# Patient Record
Sex: Female | Born: 1985 | Race: Black or African American | Hispanic: No | Smoking: Never smoker
Health system: Southern US, Community
[De-identification: ages and names within clinical notes are randomized; demographics above are authoritative.]

## PROBLEM LIST (undated history)

## (undated) ENCOUNTER — Inpatient Hospital Stay (HOSPITAL_COMMUNITY): Payer: Self-pay

## (undated) DIAGNOSIS — I10 Essential (primary) hypertension: Secondary | ICD-10-CM

## (undated) DIAGNOSIS — O139 Gestational [pregnancy-induced] hypertension without significant proteinuria, unspecified trimester: Secondary | ICD-10-CM

## (undated) DIAGNOSIS — E119 Type 2 diabetes mellitus without complications: Secondary | ICD-10-CM

## (undated) HISTORY — PX: NO PAST SURGERIES: SHX2092

---

## 2016-09-25 ENCOUNTER — Emergency Department (HOSPITAL_COMMUNITY)
Admission: EM | Admit: 2016-09-25 | Discharge: 2016-09-25 | Disposition: A | Discharge status: Home / Self Care | Payer: Medicaid Other | Attending: Dermatology | Admitting: Dermatology

## 2016-09-25 ENCOUNTER — Encounter (HOSPITAL_COMMUNITY): Payer: Self-pay | Admitting: *Deleted

## 2016-09-25 DIAGNOSIS — I10 Essential (primary) hypertension: Secondary | ICD-10-CM | POA: Diagnosis not present

## 2016-09-25 DIAGNOSIS — N898 Other specified noninflammatory disorders of vagina: Secondary | ICD-10-CM | POA: Diagnosis present

## 2016-09-25 DIAGNOSIS — E119 Type 2 diabetes mellitus without complications: Secondary | ICD-10-CM | POA: Diagnosis not present

## 2016-09-25 DIAGNOSIS — Z79899 Other long term (current) drug therapy: Secondary | ICD-10-CM | POA: Diagnosis not present

## 2016-09-25 DIAGNOSIS — Z5321 Procedure and treatment not carried out due to patient leaving prior to being seen by health care provider: Secondary | ICD-10-CM | POA: Diagnosis not present

## 2016-09-25 HISTORY — DX: Essential (primary) hypertension: I10

## 2016-09-25 HISTORY — DX: Type 2 diabetes mellitus without complications: E11.9

## 2016-09-25 NOTE — ED Triage Notes (Signed)
Pt here for vaginal itching onset yesterday, hx of the same, pt reports recently taking Antibiotics and did not request pill for yeast infection, like she normally does, denies vaginal discharge, A&O x4

## 2017-02-18 ENCOUNTER — Encounter (HOSPITAL_COMMUNITY): Payer: Self-pay

## 2017-02-18 ENCOUNTER — Emergency Department (HOSPITAL_COMMUNITY)
Admission: EM | Admit: 2017-02-18 | Discharge: 2017-02-18 | Disposition: A | Discharge status: Home / Self Care | Payer: Medicaid Other | Attending: Emergency Medicine | Admitting: Emergency Medicine

## 2017-02-18 ENCOUNTER — Encounter (HOSPITAL_COMMUNITY): Payer: Self-pay | Admitting: Emergency Medicine

## 2017-02-18 ENCOUNTER — Ambulatory Visit (HOSPITAL_COMMUNITY)
Admission: EM | Admit: 2017-02-18 | Discharge: 2017-02-18 | Disposition: A | Discharge status: Home / Self Care | Payer: Medicaid Other | Attending: Family Medicine | Admitting: Family Medicine

## 2017-02-18 DIAGNOSIS — M62830 Muscle spasm of back: Secondary | ICD-10-CM | POA: Diagnosis not present

## 2017-02-18 DIAGNOSIS — I10 Essential (primary) hypertension: Secondary | ICD-10-CM | POA: Diagnosis not present

## 2017-02-18 DIAGNOSIS — M545 Low back pain: Secondary | ICD-10-CM | POA: Insufficient documentation

## 2017-02-18 DIAGNOSIS — E119 Type 2 diabetes mellitus without complications: Secondary | ICD-10-CM | POA: Diagnosis not present

## 2017-02-18 DIAGNOSIS — N898 Other specified noninflammatory disorders of vagina: Secondary | ICD-10-CM | POA: Insufficient documentation

## 2017-02-18 DIAGNOSIS — Z5321 Procedure and treatment not carried out due to patient leaving prior to being seen by health care provider: Secondary | ICD-10-CM | POA: Diagnosis not present

## 2017-02-18 DIAGNOSIS — N760 Acute vaginitis: Secondary | ICD-10-CM | POA: Diagnosis not present

## 2017-02-18 LAB — POCT PREGNANCY, URINE: Preg Test, Ur: NEGATIVE

## 2017-02-18 LAB — POCT URINALYSIS DIP (DEVICE)
Bilirubin Urine: NEGATIVE
Glucose, UA: 100 mg/dL — AB
Ketones, ur: 15 mg/dL — AB
Leukocytes, UA: NEGATIVE
Nitrite: NEGATIVE
PH: 6 (ref 5.0–8.0)
PROTEIN: NEGATIVE mg/dL
SPECIFIC GRAVITY, URINE: 1.025 (ref 1.005–1.030)
UROBILINOGEN UA: 0.2 mg/dL (ref 0.0–1.0)

## 2017-02-18 LAB — URINALYSIS, ROUTINE W REFLEX MICROSCOPIC
BILIRUBIN URINE: NEGATIVE
Glucose, UA: >=500 mg/dL — AB
Hgb urine dipstick: NEGATIVE
Ketones, ur: 20 mg/dL — AB
Leukocytes, UA: NEGATIVE
Nitrite: NEGATIVE
PH: 6 (ref 5.0–8.0)
Protein, ur: NEGATIVE mg/dL
SQUAMOUS EPITHELIAL / LPF: NONE SEEN
Specific Gravity, Urine: 1.027 (ref 1.005–1.030)

## 2017-02-18 LAB — POC URINE PREG, ED: PREG TEST UR: NEGATIVE

## 2017-02-18 MED ORDER — METHOCARBAMOL 500 MG PO TABS: 500.0000 mg | ORAL_TABLET | Freq: Four times a day (QID) | ORAL | 0 refills | Status: DC | PRN

## 2017-02-18 MED ORDER — FLUCONAZOLE 200 MG PO TABS: ORAL_TABLET | ORAL | 0 refills | Status: DC

## 2017-02-18 NOTE — ED Triage Notes (Signed)
Patient complains of vaginal itching x 3 days. Has just recently completed antibiotics for BV

## 2017-02-18 NOTE — Discharge Instructions (Signed)
Rest, heating pad as needed, Robaxin for pain. This may cause drowsiness, do not drive until you know how this medicine affects you. It is safe to take tylenol or ibuprofen as needed with this.   Your urine is being tested for various types of infections, if positive we will call you. In the meantime, I prescribed Diflucan, take 1 tablet now, wait 3 days, then take the second tablet.

## 2017-02-18 NOTE — ED Triage Notes (Addendum)
Pt has been having lower back pain that has moved to her left flank and vaginal itching for 4 days. She was on Flagyl for BV last week.  She tried Monistat with no relief.  She has a hx of yeast infections with abx.

## 2017-02-18 NOTE — ED Provider Notes (Signed)
[facility]   [id number] 02/18/17 Arrival Time: 1443   SUBJECTIVE:  Cheryl Curry is a 31 y.o. female who presents to the urgent care with complaint of lower back pain. Worse with movement and bending. Denies any fever or chills, also has a complaint of vaginal itching. States she believes she has a yeast infection, was recently treated for bacterial vaginosis with metronidazole, and another antibiotic for another infection as well, but is unable to specify what that was. She is not concerned about STDs, stating she is sexually active, with 1 partner, she is married. Denies any other significant history     Past Medical History:  Diagnosis Date  . Diabetes mellitus without complication (HCC)   . Hypertension    History reviewed. No pertinent family history. Social History   Social History  . Marital status: Married    Spouse name: N/A  . Number of children: N/A  . Years of education: N/A   Occupational History  . Not on file.   Social History Main Topics  . Smoking status: Never Smoker  . Smokeless tobacco: Never Used  . Alcohol use Yes  . Drug use: No  . Sexual activity: Not on file   Other Topics Concern  . Not on file   Social History Narrative  . No narrative on file   Current Meds  Medication Sig  . hydrochlorothiazide (HYDRODIURIL) 25 MG tablet Take 25 mg by mouth daily.   No Known Allergies    ROS: As per HPI, remainder of ROS negative.   OBJECTIVE:   Vitals:   02/18/17 1533  BP: (!) 149/106  Pulse: 81  Temp: 98.4 F (36.9 C)  TempSrc: Oral  SpO2: 98%     General appearance: alert; no distress Eyes: PERRL; EOMI; conjunctiva normal HENT: normocephalic; atraumatic; TMs normal, canal normal, external ears normal without trauma; nasal mucosa normal; oral mucosa normal Neck: supple Lungs: clear to auscultation bilaterally Heart: regular rate and rhythm Abdomen: soft, non-tender; GU: Deferred, urine cytology  obtained Back: no CVA tenderness, Palpable muscle spasm felt in the lower left lumbar region Extremities: no cyanosis or edema; symmetrical with no gross deformities Skin: warm and dry Neurologic: normal gait; grossly normal Psychological: alert and cooperative; normal mood and affect      Labs:  Results for orders placed or performed during the hospital encounter of 02/18/17  POCT urinalysis dip (device)  Result Value Ref Range   Glucose, UA 100 (A) NEGATIVE mg/dL   Bilirubin Urine NEGATIVE NEGATIVE   Ketones, ur 15 (A) NEGATIVE mg/dL   Specific Gravity, Urine 1.025 1.005 - 1.030   Hgb urine dipstick TRACE (A) NEGATIVE   pH 6.0 5.0 - 8.0   Protein, ur NEGATIVE NEGATIVE mg/dL   Urobilinogen, UA 0.2 0.0 - 1.0 mg/dL   Nitrite NEGATIVE NEGATIVE   Leukocytes, UA NEGATIVE NEGATIVE  Pregnancy, urine POC  Result Value Ref Range   Preg Test, Ur NEGATIVE NEGATIVE    Labs Reviewed  POCT URINALYSIS DIP (DEVICE) - Abnormal; Notable for the following:       Result Value   Glucose, UA 100 (*)    Ketones, ur 15 (*)    Hgb urine dipstick TRACE (*)    All other components within normal limits  POCT PREGNANCY, URINE  URINE CYTOLOGY ANCILLARY ONLY    No results found.     ASSESSMENT & PLAN:  1. Vaginitis and vulvovaginitis   2. Muscle spasm of back     Meds ordered this  encounter  Medications  . hydrochlorothiazide (HYDRODIURIL) 25 MG tablet    Sig: Take 25 mg by mouth daily.  . fluconazole (DIFLUCAN) 200 MG tablet    Sig: Take one tablet today, wait 3 days, take the second tablet    Dispense:  2 tablet    Refill:  0  . methocarbamol (ROBAXIN) 500 MG tablet    Sig: Take 1 tablet (500 mg total) by mouth every 6 (six) hours as needed for muscle spasms.    Dispense:  40 tablet    Refill:  0    We'll treat vaginitis with Diflucan, obtain urine cytology for confirmation. Treat muscle spasm with Robaxin. Follow-up with her regular doctor, or return to clinic as  needed. Reviewed expectations re: course of current medical issues. Questions answered. Outlined signs and symptoms indicating need for more acute intervention. Patient verbalized understanding. After Visit Summary given.    Procedures:        Dorena Bodo, NP 02/18/17 1904

## 2017-02-18 NOTE — ED Notes (Signed)
RN informed by tech pt sts unable to stay any longer and had to leave. Pt was encouraged to return when able.

## 2017-02-19 LAB — URINE CYTOLOGY ANCILLARY ONLY
Chlamydia: NEGATIVE
Neisseria Gonorrhea: NEGATIVE
Trichomonas: NEGATIVE

## 2017-02-22 LAB — URINE CYTOLOGY ANCILLARY ONLY
BACTERIAL VAGINITIS: NEGATIVE
Candida vaginitis: NEGATIVE

## 2017-04-01 ENCOUNTER — Emergency Department (HOSPITAL_COMMUNITY): Payer: Medicaid Other

## 2017-04-01 ENCOUNTER — Emergency Department (HOSPITAL_COMMUNITY)
Admission: EM | Admit: 2017-04-01 | Discharge: 2017-04-01 | Disposition: A | Discharge status: Home / Self Care | Payer: Medicaid Other | Attending: Emergency Medicine | Admitting: Emergency Medicine

## 2017-04-01 ENCOUNTER — Encounter (HOSPITAL_COMMUNITY): Payer: Self-pay

## 2017-04-01 DIAGNOSIS — E119 Type 2 diabetes mellitus without complications: Secondary | ICD-10-CM | POA: Insufficient documentation

## 2017-04-01 DIAGNOSIS — R200 Anesthesia of skin: Secondary | ICD-10-CM | POA: Insufficient documentation

## 2017-04-01 DIAGNOSIS — I10 Essential (primary) hypertension: Secondary | ICD-10-CM | POA: Insufficient documentation

## 2017-04-01 DIAGNOSIS — R202 Paresthesia of skin: Secondary | ICD-10-CM

## 2017-04-01 LAB — CBC
HCT: 37.2 % (ref 36.0–46.0)
Hemoglobin: 12.9 g/dL (ref 12.0–15.0)
MCH: 31.2 pg (ref 26.0–34.0)
MCHC: 34.7 g/dL (ref 30.0–36.0)
MCV: 90.1 fL (ref 78.0–100.0)
Platelets: 284 10*3/uL (ref 150–400)
RBC: 4.13 MIL/uL (ref 3.87–5.11)
RDW: 12.3 % (ref 11.5–15.5)
WBC: 6.2 10*3/uL (ref 4.0–10.5)

## 2017-04-01 LAB — BASIC METABOLIC PANEL
ANION GAP: 11 (ref 5–15)
BUN: 9 mg/dL (ref 6–20)
CALCIUM: 8.7 mg/dL — AB (ref 8.9–10.3)
CO2: 22 mmol/L (ref 22–32)
Chloride: 101 mmol/L (ref 101–111)
Creatinine, Ser: 0.9 mg/dL (ref 0.44–1.00)
GFR calc Af Amer: >60 mL/min (ref >60)
GLUCOSE: 341 mg/dL — AB (ref 65–99)
Potassium: 3.7 mmol/L (ref 3.5–5.1)
SODIUM: 134 mmol/L — AB (ref 135–145)

## 2017-04-01 MED ORDER — LISINOPRIL 20 MG PO TABS
20.0000 mg | ORAL_TABLET | Freq: Once | ORAL | Status: AC
  Administered 2017-04-01: 20 mg via ORAL
  Filled 2017-04-01: qty 1

## 2017-04-01 NOTE — ED Provider Notes (Signed)
[facility]   CSN: [id number] Arrival date & time: 04/01/17  1214     History   Chief Complaint No chief complaint on file.   HPI Meribeth MattesDanielle Pinchback is a 31 y.o. female.  HPI  Patient with history of diabetes and hypertension with concern of bilateral upper extremity paresthesia as well as possibly 3 days ago, and symptoms been primarily in the right arm, though she had one episode of left proximal arm paresthesia earlier today. She notes that since onset she has had persistent discomfort in the right arm, described as numbness, difficulty with sensation. No complete weakness, no loss of sensation, no ataxia. No concurrent other complaints including no chest pain, no dyspnea, no lightheadedness, no syncope. Patient also ran out of her medication, has not been taking her antihypertensive.   Past Medical History:  Diagnosis Date  . Diabetes mellitus without complication (HCC)   . Hypertension     There are no active problems to display for this patient.   History reviewed. No pertinent surgical history.  OB History    No data available       Home Medications    Prior to Admission medications   Not on File    Family History No family history on file.  Social History Social History  Substance Use Topics  . Smoking status: Never Smoker  . Smokeless tobacco: Never Used  . Alcohol use Yes     Comment: occassional     Allergies   Patient has no known allergies.   Review of Systems Review of Systems  Constitutional:       Per HPI, otherwise negative  HENT:       Per HPI, otherwise negative  Respiratory:       Per HPI, otherwise negative  Cardiovascular:       Per HPI, otherwise negative  Gastrointestinal: Negative for vomiting.  Endocrine:       Negative aside from HPI  Genitourinary:       Neg aside from HPI   Musculoskeletal:       Per HPI, otherwise negative  Skin: Negative.     Neurological: Positive for numbness. Negative for syncope, weakness and headaches.     Physical Exam Updated Vital Signs BP (!) 167/122   Pulse 76   Temp 97.9 F (36.6 C) (Oral)   Resp 20   LMP 03/25/2017   SpO2 100%   Physical Exam  Constitutional: She is oriented to person, place, and time. She appears well-developed and well-nourished. No distress.  HENT:  Head: Normocephalic and atraumatic.  Eyes: Conjunctivae and EOM are normal.  Neck: No tracheal tenderness, no spinous process tenderness and no muscular tenderness present. No neck rigidity. No tracheal deviation, no edema, no erythema and normal range of motion present.  Cardiovascular: Normal rate and regular rhythm.   Pulmonary/Chest: Effort normal and breath sounds normal. No stridor. No respiratory distress.  Abdominal: She exhibits no distension.  Musculoskeletal: She exhibits no edema.  Neurological: She is alert and oriented to person, place, and time. She displays no atrophy and no tremor. No cranial nerve deficit or sensory deficit. She exhibits normal muscle tone. She displays no seizure activity. Coordination normal.  Skin: Skin is warm and dry.  Psychiatric: She has a normal mood and affect.  Nursing note and vitals reviewed.    ED Treatments / Results  Labs (all labs ordered are listed, but only abnormal results are displayed) Labs Reviewed  BASIC  METABOLIC PANEL - Abnormal; Notable for the following:       Result Value   Sodium 134 (*)    Glucose, Bld 341 (*)    Calcium 8.7 (*)    All other components within normal limits  CBC    Radiology Dg Cervical Spine Complete  Result Date: 04/01/2017 CLINICAL DATA:  Paresthesia in the upper extremity EXAM: CERVICAL SPINE - COMPLETE 4+ VIEW COMPARISON:  None. FINDINGS: Mild straightening of the cervical spine. Prevertebral soft tissue thickness is normal. Vertebral body heights are normal. Dens and lateral masses are unremarkable. Mild spurring or hair  artifact over right foramen at C3-C4. IMPRESSION: Straightening of the cervical spine.  No acute osseous abnormality. Electronically Signed   By: Jasmine Pang M.D.   On: 04/01/2017 17:17    Procedures Procedures (including critical care time)  Medications Ordered in ED   Initial Impression / Assessment and Plan / ED Course  I have reviewed the triage vital signs and the nursing notes.  Pertinent labs & imaging results that were available during my care of the patient were reviewed by me and considered in my medical decision making (see chart for details).  6:45 PM Patient presents with concern of paresthesias in both arms, though has no objective neurologic deficits. No evidence for stroke, some suspicion for persistent hypertension given the patient's medication noncompliance. With reassuring labs, no evidence for stroke, and after x-rays performed to exclude cervical lesion, the patient received her home medication, was discharged with instructions to resume her home medication which she will do in the morning, and follow-up with primary care.   Final Clinical Impressions(s) / ED Diagnoses  Numbness    Gerhard Munch, MD 04/01/17 [phone]

## 2017-04-01 NOTE — ED Notes (Signed)
Patient taken to XRAY

## 2017-04-01 NOTE — ED Notes (Signed)
Patient Alert and oriented X4. Stable and ambulatory. Patient verbalized understanding of the discharge instructions.  Patient belongings were taken by the patient.  

## 2017-04-01 NOTE — Discharge Instructions (Signed)
As discussed, your evaluation today has been largely reassuring.  But, it is important that you monitor your condition carefully, and do not hesitate to return to the ED if you develop new, or concerning changes in your condition. ? ?Otherwise, please follow-up with your physician for appropriate ongoing care. ? ?

## 2017-04-01 NOTE — ED Triage Notes (Signed)
Patient complains of 2 days of right arm tingling/numbness with intermittent swelling, no neuro deficits, denies trauma. NAD

## 2017-04-02 ENCOUNTER — Encounter (HOSPITAL_COMMUNITY): Payer: Self-pay

## 2017-06-08 NOTE — L&D Delivery Note (Addendum)
32 y.o. [id number] at 4333w2d admitted for IOL for cHTN, SIPE with SF and T2DM augmented by cytotec, foley bulb, and pitocin,  delivered a viable female infant at 0809 in cephalic, LOA position. No nuchal cord. Shoulder dystocia for 50 sec with McRobert's, Suprapubic pressure, and Wood screw attempted before delivering L anterior shoulder. 20 sec delayed cord clamping. Cord clamped x2 and cut due to poor respiratory effort and tone. Placenta delivered spontaneously with trailing membranes extracted with ring forceps and 3VC. Fundus firm on exam with massage, tranexamic acid and pitocin. Good hemostasis noted after right sulcus laceration repaired and meds administered.  Anesthesia: Epidural Laceration: Right sulcus laceration Suture: 3.0 vicryl Good hemostasis noted. EBL: 470 cc  Mom recovering in LDR, baby sent to NICU for poor respiratory effort.    Apgars: APGAR (1 MIN): 1 APGAR (5 MINS): 5 Weight: 4070g Sponge and instrument count were correct x2. Placenta sent to L&D. Girl, breast and bottle feeding, vasectomy (done) for contraception.  [place], DO FM Resident PGY-2 04/29/2018 8:44 AM   OB FELLOW DELIVERY ATTESTATION  I was gloved and present for the delivery in its entirety, and I agree with the above resident's note.    Marcy Sirenatherine Wallace, D.O. OB Fellow  04/30/2018, 10:21 AM

## 2017-07-18 ENCOUNTER — Other Ambulatory Visit: Payer: Self-pay | Admitting: Primary Care

## 2017-07-18 DIAGNOSIS — N63 Unspecified lump in unspecified breast: Secondary | ICD-10-CM

## 2017-07-23 ENCOUNTER — Ambulatory Visit: Payer: Medicaid Other

## 2017-07-23 ENCOUNTER — Ambulatory Visit
Admission: RE | Admit: 2017-07-23 | Discharge: 2017-07-23 | Disposition: A | Discharge status: Home / Self Care | Payer: Medicaid Other | Source: Ambulatory Visit | Attending: Primary Care | Admitting: Primary Care

## 2017-07-23 DIAGNOSIS — N63 Unspecified lump in unspecified breast: Secondary | ICD-10-CM

## 2017-08-02 ENCOUNTER — Ambulatory Visit
Admission: RE | Admit: 2017-08-02 | Discharge: 2017-08-02 | Disposition: A | Discharge status: Home / Self Care | Payer: Medicaid Other | Source: Ambulatory Visit | Attending: Primary Care | Admitting: Primary Care

## 2017-08-02 ENCOUNTER — Ambulatory Visit: Payer: Medicaid Other

## 2017-08-29 ENCOUNTER — Encounter (HOSPITAL_COMMUNITY): Payer: Self-pay | Admitting: Emergency Medicine

## 2017-08-29 ENCOUNTER — Ambulatory Visit (HOSPITAL_COMMUNITY)
Admission: EM | Admit: 2017-08-29 | Discharge: 2017-08-29 | Disposition: A | Discharge status: Home / Self Care | Payer: Medicaid Other | Attending: Internal Medicine | Admitting: Internal Medicine

## 2017-08-29 DIAGNOSIS — R0981 Nasal congestion: Secondary | ICD-10-CM

## 2017-08-29 DIAGNOSIS — Z79899 Other long term (current) drug therapy: Secondary | ICD-10-CM | POA: Insufficient documentation

## 2017-08-29 DIAGNOSIS — E119 Type 2 diabetes mellitus without complications: Secondary | ICD-10-CM | POA: Insufficient documentation

## 2017-08-29 DIAGNOSIS — I10 Essential (primary) hypertension: Secondary | ICD-10-CM | POA: Diagnosis not present

## 2017-08-29 DIAGNOSIS — R21 Rash and other nonspecific skin eruption: Secondary | ICD-10-CM

## 2017-08-29 DIAGNOSIS — N898 Other specified noninflammatory disorders of vagina: Secondary | ICD-10-CM | POA: Diagnosis present

## 2017-08-29 DIAGNOSIS — Z7984 Long term (current) use of oral hypoglycemic drugs: Secondary | ICD-10-CM | POA: Diagnosis not present

## 2017-08-29 LAB — POCT PREGNANCY, URINE: Preg Test, Ur: NEGATIVE

## 2017-08-29 MED ORDER — FLUCONAZOLE 150 MG PO TABS: 150.0000 mg | ORAL_TABLET | Freq: Once | ORAL | 0 refills | Status: AC

## 2017-08-29 MED ORDER — CETIRIZINE HCL 10 MG PO CAPS: 10.0000 mg | ORAL_CAPSULE | Freq: Every day | ORAL | 0 refills | Status: DC

## 2017-08-29 MED ORDER — TRIAMCINOLONE ACETONIDE 0.1 % EX CREA
1.0000 | TOPICAL_CREAM | Freq: Two times a day (BID) | CUTANEOUS | 0 refills | Status: DC
Start: 2017-08-29 — End: 2017-10-28

## 2017-08-29 MED ORDER — FLUTICASONE PROPIONATE 50 MCG/ACT NA SUSP
1.0000 | Freq: Every day | NASAL | 0 refills | Status: DC
Start: 2017-08-29 — End: 2017-10-28

## 2017-08-29 NOTE — Discharge Instructions (Addendum)
For your yeast infection please take 1 Diflucan today.  May repeat in 3 days if symptoms not improving.  I have provided a couple extra tablets that you frequently get yeast infections.  Please begin Zyrtec and Flonase for your nasal congestion.  Please compare price to over-the-counter and get whichever is cheaper for you.  Please try Kenalog cream on rash, please apply twice daily for the next week.  Please return if symptoms not improving and still bothering you.

## 2017-08-29 NOTE — ED Provider Notes (Signed)
[facility]    CSN: [id number] Arrival date & time: 08/29/17  1229     History   Chief Complaint Chief Complaint  Patient presents with  . Vaginal Itching  . Nasal Congestion  . Rash    HPI Cheryl Curry is a 32 y.o. female presenting today for vaginal itching and nasal congestion.  She has had vaginal itching for approximately 3 days.  Denies any change in her discharge.  Last menstrual period was 2/20, patient is not on any form of birth control.  Patient states she frequently has yeast infections as she is diabetic.  Denies any nausea, vomiting, abdominal pain, pelvic pain.  She also has had some nasal congestion, not associated with sore throat or coughing.  She believes it is related to her allergies.  She is not taking anything.  Denies any chest pain or shortness of breath.  Denies fevers.  Patient also has a rash on her left ankle, that is associated with occasional itching.  States that it comes and goes.  Denies any rash anywhere else on her body.  She cannot say how long it has been there as she is unsure.  HPI  Past Medical History:  Diagnosis Date  . Diabetes mellitus without complication (HCC)   . Hypertension     There are no active problems to display for this patient.   History reviewed. No pertinent surgical history.  OB History   None      Home Medications    Prior to Admission medications   Medication Sig Start Date End Date Taking? Authorizing Provider  hydrochlorothiazide (HYDRODIURIL) 25 MG tablet Take 25 mg by mouth daily.   Yes [provider]  lisinopril (PRINIVIL,ZESTRIL) 20 MG tablet Take 20 mg by mouth daily.   Yes [provider]  acetaminophen (TYLENOL) 500 MG tablet Take 1,000 mg by mouth every 6 (six) hours as needed for mild pain.    [provider]  Cetirizine HCl 10 MG CAPS Take 1 capsule (10 mg total) by mouth daily. 08/29/17 09/28/17  Wieters, Hallie C, PA-C  fluconazole  (DIFLUCAN) 150 MG tablet Take 1 tablet (150 mg total) by mouth once for 1 dose. 08/29/17 08/29/17  Wieters, Hallie C, PA-C  fluticasone (FLONASE) 50 MCG/ACT nasal spray Place 1-2 sprays into both nostrils daily. 08/29/17   Wieters, Hallie C, PA-C  metFORMIN (GLUCOPHAGE) 1000 MG tablet Take 1,000 mg by mouth 2 (two) times daily with a meal.    [provider]  methocarbamol (ROBAXIN) 500 MG tablet Take 1 tablet (500 mg total) by mouth every 6 (six) hours as needed for muscle spasms. Patient not taking: Reported on 08/29/2017 02/18/17   Dorena BodoKennard, Lawrence, NP  triamcinolone cream (KENALOG) 0.1 % Apply 1 application topically 2 (two) times daily. 08/29/17   Wieters, Junius CreamerHallie C, PA-C    Family History No family history on file.  Social History Social History   Tobacco Use  . Smoking status: Never Smoker  . Smokeless tobacco: Never Used  Substance Use Topics  . Alcohol use: Yes    Comment: occassional  . Drug use: No     Allergies   Patient has no known allergies.   Review of Systems Review of Systems  Constitutional: Negative for fever.  HENT: Positive for congestion and rhinorrhea. Negative for ear pain and sore throat.   Respiratory: Negative for cough and shortness of breath.   Cardiovascular: Negative for chest pain.  Gastrointestinal: Negative for abdominal pain, diarrhea, nausea and  vomiting.  Genitourinary: Negative for dysuria, flank pain, frequency, genital sores, hematuria, menstrual problem, vaginal bleeding, vaginal discharge and vaginal pain.  Musculoskeletal: Negative for back pain.  Skin: Positive for rash.  Neurological: Negative for dizziness, light-headedness and headaches.     Physical Exam Triage Vital Signs ED Triage Vitals [08/29/17 1310]  Enc Vitals Group     BP (!) 168/111     Pulse Rate (!) 102     Resp 18     Temp 98.2 F (36.8 C)     Temp src      SpO2 100 %     Weight      Height      Head Circumference      Peak Flow      Pain Score 0       Pain Loc      Pain Edu?      Excl. in GC?    No data found.  Updated Vital Signs BP (!) 168/111   Pulse (!) 102   Temp 98.2 F (36.8 C)   Resp 18   LMP 08/01/2017   SpO2 100%   Visual Acuity Right Eye Distance:   Left Eye Distance:   Bilateral Distance:    Right Eye Near:   Left Eye Near:    Bilateral Near:     Physical Exam  Constitutional: She appears well-developed and well-nourished. No distress.  HENT:  Head: Normocephalic and atraumatic.  Bilateral TMs nonerythematous, nasal mucosa mildly erythematous with clear rhinorrhea present.  Posterior oropharynx nonerythematous, no tonsillar enlargement or exudate.  Eyes: Conjunctivae are normal.  Neck: Neck supple.  Cardiovascular: Normal rate and regular rhythm.  No murmur heard. Pulmonary/Chest: Effort normal and breath sounds normal. No respiratory distress.  Breathing comfortably at rest, CTA BL  Abdominal: Soft. There is no tenderness.  Musculoskeletal: She exhibits no edema.  Neurological: She is alert.  Skin: Skin is warm and dry.  3 cm circular area of hyperpigmentation to lateral malleolus of left ankle, no overlying scaling or surrounding erythema  Psychiatric: She has a normal mood and affect.  Nursing note and vitals reviewed.    UC Treatments / Results  Labs (all labs ordered are listed, but only abnormal results are displayed) Labs Reviewed  POCT PREGNANCY, URINE  URINE CYTOLOGY ANCILLARY ONLY    EKG None Radiology No results found.  Procedures Procedures (including critical care time)  Medications Ordered in UC Medications - No data to display   Initial Impression / Assessment and Plan / UC Course  I have reviewed the triage vital signs and the nursing notes.  Pertinent labs & imaging results that were available during my care of the patient were reviewed by me and considered in my medical decision making (see chart for details).     Patient with vaginal itching, frequent yeast  infections.  Will treat with Diflucan.  Will send with extra tablets to use for future infections.  Urine cytology sent to confirm yeast.  Congestion likely related to allergic rhinitis.  Zyrtec and Flonase daily.  Rash possible eczema-like versus other nonspecific skin eruption.  Will try triamcinolone cream.  Discussed strict return precautions. Patient verbalized understanding and is agreeable with plan.   Final Clinical Impressions(s) / UC Diagnoses   Final diagnoses:  Vaginal itching  Nasal congestion  Rash    ED Discharge Orders        Ordered    fluconazole (DIFLUCAN) 150 MG tablet   Once  08/29/17 1336    Cetirizine HCl 10 MG CAPS  Daily     08/29/17 1336    fluticasone (FLONASE) 50 MCG/ACT nasal spray  Daily     08/29/17 1336    triamcinolone cream (KENALOG) 0.1 %  2 times daily     08/29/17 1337       Controlled Substance Prescriptions [place] Controlled Substance Registry consulted? Not Applicable   Lew Dawes, [place] 08/29/17 1342

## 2017-08-29 NOTE — ED Triage Notes (Signed)
Pt c/o vaginal itching, and her nose has also been running. Pt also has a rash on her leg that itches.

## 2017-08-30 LAB — URINE CYTOLOGY ANCILLARY ONLY
Chlamydia: NEGATIVE
Neisseria Gonorrhea: NEGATIVE
Trichomonas: NEGATIVE

## 2017-08-31 LAB — URINE CYTOLOGY ANCILLARY ONLY: CANDIDA VAGINITIS: NEGATIVE

## 2017-09-06 ENCOUNTER — Telehealth (HOSPITAL_COMMUNITY): Payer: Self-pay | Admitting: Emergency Medicine

## 2017-09-06 NOTE — Telephone Encounter (Signed)
Called in metronidazole 500 mg BID X 7 days , #14, no refills-ordered by dr Dayton Scrapemurray.  Patient is aware of script and chose the pill form over the gel.  Patient is still having symptoms, based on dr Rennie Plowmanmurray's instructions, med called to patient's preferred pharmacy.  Called to [facility] [place].  Patient agreeable at end of phone conversation

## 2017-09-07 ENCOUNTER — Telehealth (HOSPITAL_COMMUNITY): Payer: Self-pay

## 2017-09-07 NOTE — Telephone Encounter (Signed)
Pt contacted regarding results from recent visit. States she called yesterday and they sent her prescription to her pharmacy already.

## 2017-09-22 LAB — OB RESULTS CONSOLE GC/CHLAMYDIA
Chlamydia: NEGATIVE
Gonorrhea: NEGATIVE

## 2017-09-22 LAB — OB RESULTS CONSOLE ABO/RH: RH TYPE: POSITIVE

## 2017-09-22 LAB — OB RESULTS CONSOLE PLATELET COUNT: PLATELETS: 286

## 2017-09-22 LAB — OB RESULTS CONSOLE HGB/HCT, BLOOD
HEMATOCRIT: 40
HEMOGLOBIN: 13.6

## 2017-09-30 ENCOUNTER — Inpatient Hospital Stay (HOSPITAL_COMMUNITY): Payer: Medicaid Other

## 2017-09-30 ENCOUNTER — Encounter (HOSPITAL_COMMUNITY): Payer: Self-pay | Admitting: *Deleted

## 2017-09-30 ENCOUNTER — Inpatient Hospital Stay (HOSPITAL_COMMUNITY)
Admission: AD | Admit: 2017-09-30 | Discharge: 2017-09-30 | Disposition: A | Discharge status: Home / Self Care | Payer: Medicaid Other | Source: Ambulatory Visit | Attending: Obstetrics and Gynecology | Admitting: Obstetrics and Gynecology

## 2017-09-30 ENCOUNTER — Encounter: Payer: Self-pay | Admitting: *Deleted

## 2017-09-30 DIAGNOSIS — O26891 Other specified pregnancy related conditions, first trimester: Secondary | ICD-10-CM | POA: Diagnosis not present

## 2017-09-30 DIAGNOSIS — Z3A01 Less than 8 weeks gestation of pregnancy: Secondary | ICD-10-CM | POA: Insufficient documentation

## 2017-09-30 DIAGNOSIS — O161 Unspecified maternal hypertension, first trimester: Secondary | ICD-10-CM | POA: Insufficient documentation

## 2017-09-30 DIAGNOSIS — R109 Unspecified abdominal pain: Secondary | ICD-10-CM | POA: Diagnosis present

## 2017-09-30 DIAGNOSIS — O209 Hemorrhage in early pregnancy, unspecified: Secondary | ICD-10-CM | POA: Diagnosis not present

## 2017-09-30 DIAGNOSIS — O26899 Other specified pregnancy related conditions, unspecified trimester: Secondary | ICD-10-CM

## 2017-09-30 DIAGNOSIS — O24911 Unspecified diabetes mellitus in pregnancy, first trimester: Secondary | ICD-10-CM | POA: Insufficient documentation

## 2017-09-30 DIAGNOSIS — Z79899 Other long term (current) drug therapy: Secondary | ICD-10-CM | POA: Insufficient documentation

## 2017-09-30 DIAGNOSIS — M549 Dorsalgia, unspecified: Secondary | ICD-10-CM | POA: Diagnosis not present

## 2017-09-30 DIAGNOSIS — O10911 Unspecified pre-existing hypertension complicating pregnancy, first trimester: Secondary | ICD-10-CM

## 2017-09-30 DIAGNOSIS — R102 Pelvic and perineal pain: Secondary | ICD-10-CM | POA: Diagnosis not present

## 2017-09-30 LAB — CBC
HCT: 39.9 % (ref 36.0–46.0)
HEMOGLOBIN: 13.8 g/dL (ref 12.0–15.0)
MCH: 31.4 pg (ref 26.0–34.0)
MCHC: 34.6 g/dL (ref 30.0–36.0)
MCV: 90.7 fL (ref 78.0–100.0)
PLATELETS: 279 10*3/uL (ref 150–400)
RBC: 4.4 MIL/uL (ref 3.87–5.11)
RDW: 12.6 % (ref 11.5–15.5)
WBC: 7.7 10*3/uL (ref 4.0–10.5)

## 2017-09-30 LAB — ABO/RH: ABO/RH(D): O POS

## 2017-09-30 LAB — HCG, QUANTITATIVE, PREGNANCY: hCG, Beta Chain, Quant, S: 11754 m[IU]/mL — ABNORMAL HIGH (ref <5)

## 2017-09-30 MED ORDER — LABETALOL HCL 200 MG PO TABS: 200.0000 mg | ORAL_TABLET | Freq: Two times a day (BID) | ORAL | 3 refills | Status: DC

## 2017-09-30 NOTE — MAU Note (Signed)
SHE SIGNED RELEASE FORM  TO SEE DR  [place]

## 2017-09-30 NOTE — Discharge Instructions (Signed)
First Trimester of Pregnancy The first trimester of pregnancy is from week 1 until the end of week 13 (months 1 through 3). During this time, your baby will begin to develop inside you. At 6-8 weeks, the eyes and face are formed, and the heartbeat can be seen on ultrasound. At the end of 12 weeks, all the baby's organs are formed. Prenatal care is all the medical care you receive before the birth of your baby. Make sure you get good prenatal care and follow all of your doctor's instructions. Follow these instructions at home: Medicines  Take over-the-counter and prescription medicines only as told by your doctor. Some medicines are safe and some medicines are not safe during pregnancy.  Take a prenatal vitamin that contains at least 600 micrograms (mcg) of folic acid.  If you have trouble pooping (constipation), take medicine that will make your stool soft (stool softener) if your doctor approves. Eating and drinking  Eat regular, healthy meals.  Your doctor will tell you the amount of weight gain that is right for you.  Avoid raw meat and uncooked cheese.  If you feel sick to your stomach (nauseous) or throw up (vomit): ? Eat 4 or 5 small meals a day instead of 3 large meals. ? Try eating a few soda crackers. ? Drink liquids between meals instead of during meals.  To prevent constipation: ? Eat foods that are high in fiber, like fresh fruits and vegetables, whole grains, and beans. ? Drink enough fluids to keep your pee (urine) clear or pale yellow. Activity  Exercise only as told by your doctor. Stop exercising if you have cramps or pain in your lower belly (abdomen) or low back.  Do not exercise if it is too hot, too humid, or if you are in a place of great height (high altitude).  Try to avoid standing for long periods of time. Move your legs often if you must stand in one place for a long time.  Avoid heavy lifting.  Wear low-heeled shoes. Sit and stand up straight.  You  can have sex unless your doctor tells you not to. Relieving pain and discomfort  Wear a good support bra if your breasts are sore.  Take warm water baths (sitz baths) to soothe pain or discomfort caused by hemorrhoids. Use hemorrhoid cream if your doctor says it is okay.  Rest with your legs raised if you have leg cramps or low back pain.  If you have puffy, bulging veins (varicose veins) in your legs: ? Wear support hose or compression stockings as told by your doctor. ? Raise (elevate) your feet for 15 minutes, 3-4 times a day. ? Limit salt in your food. Prenatal care  Schedule your prenatal visits by the twelfth week of pregnancy.  Write down your questions. Take them to your prenatal visits.  Keep all your prenatal visits as told by your doctor. This is important. Safety  Wear your seat belt at all times when driving.  Make a list of emergency phone numbers. The list should include numbers for family, friends, the hospital, and police and fire departments. General instructions  Ask your doctor for a referral to a local prenatal class. Begin classes no later than at the start of month 6 of your pregnancy.  Ask for help if you need counseling or if you need help with nutrition. Your doctor can give you advice or tell you where to go for help.  Do not use hot tubs, steam rooms, or   saunas.  Do not douche or use tampons or scented sanitary pads.  Do not cross your legs for long periods of time.  Avoid all herbs and alcohol. Avoid drugs that are not approved by your doctor.  Do not use any tobacco products, including cigarettes, chewing tobacco, and electronic cigarettes. If you need help quitting, ask your doctor. You may get counseling or other support to help you quit.  Avoid cat litter boxes and soil used by cats. These carry germs that can cause birth defects in the baby and can cause a loss of your baby (miscarriage) or stillbirth.  Visit your dentist. At home, brush  your teeth with a soft toothbrush. Be gentle when you floss. Contact a doctor if:  You are dizzy.  You have mild cramps or pressure in your lower belly.  You have a nagging pain in your belly area.  You continue to feel sick to your stomach, you throw up, or you have watery poop (diarrhea).  You have a bad smelling fluid coming from your vagina.  You have pain when you pee (urinate).  You have increased puffiness (swelling) in your face, hands, legs, or ankles. Get help right away if:  You have a fever.  You are leaking fluid from your vagina.  You have spotting or bleeding from your vagina.  You have very bad belly cramping or pain.  You gain or lose weight rapidly.  You throw up blood. It may look like coffee grounds.  You are around people who have German measles, fifth disease, or chickenpox.  You have a very bad headache.  You have shortness of breath.  You have any kind of trauma, such as from a fall or a car accident. Summary  The first trimester of pregnancy is from week 1 until the end of week 13 (months 1 through 3).  To take care of yourself and your unborn baby, you will need to eat healthy meals, take medicines only if your doctor tells you to do so, and do activities that are safe for you and your baby.  Keep all follow-up visits as told by your doctor. This is important as your doctor will have to ensure that your baby is healthy and growing well. This information is not intended to replace advice given to you by your health care provider. Make sure you discuss any questions you have with your health care provider. Document Released: 11/11/2007 Document Revised: 06/02/2016 Document Reviewed: 06/02/2016 Elsevier Interactive Patient Education  2017 Elsevier Inc.  

## 2017-09-30 NOTE — MAU Note (Signed)
PT SAYS  SHE CRAMPING- STARTED  LAST WED-   OFF/ ON.    SPOTTING STARTED YESTERDAY -   HAPPENED  X1   THEN X1  TODAY  - WHEN SHE WIPES.    FELT BACK PAIN IN LOBBY .   LAST SEX-  LAST WEEKEND.

## 2017-09-30 NOTE — MAU Provider Note (Signed)
Chief Complaint: Vaginal Bleeding   First Provider Initiated Contact with Patient 09/30/17 2310        SUBJECTIVE HPI: Cheryl Curry is a 32 y.o. G3P0002 at [redacted]w[redacted]d by LMP who presents to maternity admissions reporting cramping since last week.  Had some spotting yesterday.  Also has some back pain.  . She denies vaginal itching/burning, urinary symptoms, h/a, dizziness, n/v, or fever/chills.     Vaginal Bleeding  The patient's primary symptoms include pelvic pain and vaginal bleeding. The patient's pertinent negatives include no genital itching, genital lesions or genital odor. This is a new problem. The current episode started in the past 7 days. The problem occurs intermittently. The problem has been unchanged. The pain is mild. She is pregnant. Associated symptoms include abdominal pain and back pain. Pertinent negatives include no constipation, diarrhea, dysuria, fever or headaches. The vaginal discharge was bloody. The vaginal bleeding is spotting. She has not been passing clots. She has not been passing tissue. Nothing aggravates the symptoms. She has tried nothing for the symptoms. She is sexually active. She uses nothing for contraception.   RN Note: PT SAYS  SHE CRAMPING- STARTED  LAST WED-   OFF/ ON.    SPOTTING STARTED YESTERDAY -   HAPPENED  X1   THEN X1  TODAY  - WHEN SHE WIPES.    FELT BACK PAIN IN LOBBY .   LAST SEX-  LAST WEEKEND    Past Medical History:  Diagnosis Date  . Diabetes mellitus without complication (HCC)   . Hypertension    No past surgical history on file. Social History   Socioeconomic History  . Marital status: Married    Spouse name: Not on file  . Number of children: Not on file  . Years of education: Not on file  . Highest education level: Not on file  Occupational History  . Not on file  Social Needs  . Financial resource strain: Not on file  . Food insecurity:    Worry: Not on file    Inability: Not on file  . Transportation needs:     Medical: Not on file    Non-medical: Not on file  Tobacco Use  . Smoking status: Never Smoker  . Smokeless tobacco: Never Used  Substance and Sexual Activity  . Alcohol use: Yes    Comment: occassional  . Drug use: No  . Sexual activity: Not on file  Lifestyle  . Physical activity:    Days per week: Not on file    Minutes per session: Not on file  . Stress: Not on file  Relationships  . Social connections:    Talks on phone: Not on file    Gets together: Not on file    Attends religious service: Not on file    Active member of club or organization: Not on file    Attends meetings of clubs or organizations: Not on file    Relationship status: Not on file  . Intimate partner violence:    Fear of current or ex partner: Not on file    Emotionally abused: Not on file    Physically abused: Not on file    Forced sexual activity: Not on file  Other Topics Concern  . Not on file  Social History Narrative   ** Merged History Encounter **       No current facility-administered medications on file prior to encounter.    Current Outpatient Medications on File Prior to Encounter  Medication Sig Dispense  Refill  . acetaminophen (TYLENOL) 500 MG tablet Take 1,000 mg by mouth every 6 (six) hours as needed for mild pain.    Marland Kitchen lisinopril-hydrochlorothiazide (PRINZIDE,ZESTORETIC) 20-25 MG tablet Take 1 tablet by mouth daily.    . Prenatal Vit-Fe Fumarate-FA (PRENATAL MULTIVITAMIN) TABS tablet Take 1 tablet by mouth daily at 12 noon.    . triamcinolone cream (KENALOG) 0.1 % Apply 1 application topically 2 (two) times daily. 30 g 0  . Cetirizine HCl 10 MG CAPS Take 1 capsule (10 mg total) by mouth daily. 30 capsule 0  . fluticasone (FLONASE) 50 MCG/ACT nasal spray Place 1-2 sprays into both nostrils daily. (Patient not taking: Reported on 09/30/2017) 1 g 0   No Known Allergies  I have reviewed patient's Past Medical Hx, Surgical Hx, Family Hx, Social Hx, medications and allergies.    ROS:  Review of Systems  Constitutional: Negative for fever.  Gastrointestinal: Positive for abdominal pain. Negative for constipation and diarrhea.  Genitourinary: Positive for pelvic pain and vaginal bleeding. Negative for dysuria.  Musculoskeletal: Positive for back pain.  Neurological: Negative for headaches.   Review of Systems  Other systems negative   Physical Exam  Physical Exam Patient Vitals for the past 24 hrs:  BP Temp Temp src Resp Height Weight  09/30/17 2015 (!) 144/110 98.7 F (37.1 C) Oral 20 5\' 6"  (1.676 m) 203 lb 4 oz (92.2 kg)   Vitals:   09/30/17 2015 09/30/17 2341  BP: (!) 144/110 (!) 156/107  Pulse:  83  Resp: 20 18  Temp: 98.7 F (37.1 C)   TempSrc: Oral   Weight: 203 lb 4 oz (92.2 kg)   Height: 5\' 6"  (1.676 m)     Constitutional: Well-developed, well-nourished female in no acute distress.  Cardiovascular: normal rate Respiratory: normal effort GI: Abd soft, non-tender. Pos BS x 4 MS: Extremities nontender, no edema, normal ROM Neurologic: Alert and oriented x 4.  GU: Neg CVAT.  PELVIC EXAM: Cervix pink, visually closed, without lesion, scant red discharge, vaginal walls and external genitalia normal Bimanual exam: Cervix 0/long/high, firm, anterior, neg CMT, uterus nontender, nonenlarged, adnexa without tenderness, enlargement, or mass   LAB RESULTS Results for orders placed or performed during the hospital encounter of 09/30/17 (from the past 24 hour(s))  ABO/Rh     Status: None   Collection Time: 09/30/17  8:35 PM  Result Value Ref Range   ABO/RH(D)      O POS Performed at [facility], [address], [place], [place] [address]   hCG, quantitative, pregnancy     Status: Abnormal   Collection Time: 09/30/17  8:35 PM  Result Value Ref Range   hCG, Beta Chain, Quant, S 11,754 (H) <5 mIU/mL  CBC     Status: None   Collection Time: 09/30/17  8:35 PM  Result Value Ref Range   WBC 7.7 4.0 - 10.5 K/uL   RBC 4.40 3.87 - 5.11  MIL/uL   Hemoglobin 13.8 12.0 - 15.0 g/dL   HCT 60.4 54.0 - 98.1 %   MCV 90.7 78.0 - 100.0 fL   MCH 31.4 26.0 - 34.0 pg   MCHC 34.6 30.0 - 36.0 g/dL   RDW 19.1 47.8 - 29.5 %   Platelets 279 150 - 400 K/uL    --/--/O POS Performed at [facility], [address] [place], [place] [address]  [phone] 2035)  IMAGING US Ob Comp Less 14 Wks  Result Date: 09/30/2017 CLINICAL DATA:  31 y/o  F; spotting  and cramping.  Beta HCG 11,754. EXAM: OBSTETRIC <14 WK US AND TRANSVAGINAL OB US TECHNIQUE: Both transabdominal and transvaginal ultrasound examinations were performed for complete evaluation of the gestation as well as the maternal uterus, adnexal regions, and pelvic cul-de-sac. Transvaginal technique was performed to assess early pregnancy. COMPARISON:  None. FINDINGS: Intrauterine gestational sac: Single Yolk sac:  Visualized. Embryo:  Visualized. Cardiac Activity: Visualized. Heart Rate: 123 bpm CRL:  4.8 mm   6 w   1 d                  US EDC: 05/25/2018 Subchorionic hemorrhage:  None visualized. 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 IMPRESSION: Single live intrauterine pregnancy. Estimated gestational age [redacted] weeks 1 day. No acute process identified. Electronically Signed   By: Mitzi HansenLance  Furusawa-Stratton M.D.   On: 09/30/2017 22:46   [place] Ob Transvaginal  Result Date: 09/30/2017 CLINICAL DATA:  32 y/o  F; spotting and cramping.  Beta HCG 11,754. EXAM: OBSTETRIC <14 WK US AND TRANSVAGINAL OB US TECHNIQUE: Both transabdominal and transvaginal ultrasound examinations were performed for complete evaluation of the gestation as well as the maternal uterus, adnexal regions, and pelvic cul-de-sac. Transvaginal technique was performed to assess early pregnancy. COMPARISON:  None. FINDINGS: Intrauterine gestational sac: Single Yolk sac:  Visualized. Embryo:  Visualized. Cardiac Activity: Visualized. Heart Rate: 123 bpm CRL:  4.8 mm   6 w   1 d                  US EDC:  05/25/2018 Subchorionic hemorrhage:  None visualized. 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 IMPRESSION: Single live intrauterine pregnancy. Estimated gestational age [redacted] weeks 1 day. No acute process identified. Electronically Signed   By: Mitzi HansenLance  Furusawa-Stratton M.D.   On: 09/30/2017 22:46    MAU Management/MDM: Ordered usual first trimester r/o ectopic labs.   Pelvic exam and cultures done Will check baseline Ultrasound to rule out ectopic.  This bleeding/pain can represent a normal pregnancy with bleeding, spontaneous abortion or even an ectopic which can be life-threatening.  The process as listed above helps to determine which of these is present.  Reviewed findings with patient.  Live single IUP noted.  Encouraged to seek prenatal care.  States has appt next week.  Discussed very high blood pressure.  States she forgot to take her med this morning  Will have her stop Lisinopril (but go ahead and take one tonight) and start Labetalol.    ASSESSMENT 1. Pelvic pain affecting pregnancy   2. Pelvic pain affecting pregnancy   3.     Live single intrauterine pregnancy 4.     Hypertension, chronic  PLAN Discharge home Pelvic rest Rx Labetalol Pt stable at time of discharge. Encouraged to return here or to other Urgent Care/ED if she develops worsening of symptoms, increase in pain, fever, or other concerning symptoms.    Wynelle BourgeoisMarie Kashauna Celmer CNM, MSN Certified Nurse-Midwife 09/30/2017  11:11 PM

## 2017-10-01 LAB — HIV ANTIBODY (ROUTINE TESTING W REFLEX): HIV SCREEN 4TH GENERATION: NONREACTIVE

## 2017-10-12 ENCOUNTER — Telehealth: Payer: Self-pay | Admitting: General Practice

## 2017-10-12 ENCOUNTER — Other Ambulatory Visit: Payer: Medicaid Other

## 2017-10-12 NOTE — Telephone Encounter (Signed)
Patient was late for her appointment with Meriam Sprague (Diabetes Educator).  Patient has been scheduled at [facility] on 10/20/17 at 3:15pm. [facility] [address]) Notified patient of appointment and patient agreed and voiced understanding.

## 2017-10-14 ENCOUNTER — Ambulatory Visit (HOSPITAL_COMMUNITY)
Admission: EM | Admit: 2017-10-14 | Discharge: 2017-10-14 | Disposition: A | Discharge status: Home / Self Care | Payer: Medicaid Other | Attending: Family Medicine | Admitting: Family Medicine

## 2017-10-14 ENCOUNTER — Encounter (HOSPITAL_COMMUNITY): Payer: Self-pay | Admitting: Emergency Medicine

## 2017-10-14 DIAGNOSIS — O99711 Diseases of the skin and subcutaneous tissue complicating pregnancy, first trimester: Secondary | ICD-10-CM | POA: Insufficient documentation

## 2017-10-14 DIAGNOSIS — Z7984 Long term (current) use of oral hypoglycemic drugs: Secondary | ICD-10-CM | POA: Diagnosis not present

## 2017-10-14 DIAGNOSIS — Z3A08 8 weeks gestation of pregnancy: Secondary | ICD-10-CM | POA: Insufficient documentation

## 2017-10-14 DIAGNOSIS — L298 Other pruritus: Secondary | ICD-10-CM | POA: Diagnosis present

## 2017-10-14 DIAGNOSIS — R350 Frequency of micturition: Secondary | ICD-10-CM | POA: Diagnosis not present

## 2017-10-14 DIAGNOSIS — N898 Other specified noninflammatory disorders of vagina: Secondary | ICD-10-CM | POA: Diagnosis not present

## 2017-10-14 DIAGNOSIS — O161 Unspecified maternal hypertension, first trimester: Secondary | ICD-10-CM | POA: Diagnosis not present

## 2017-10-14 DIAGNOSIS — O26891 Other specified pregnancy related conditions, first trimester: Secondary | ICD-10-CM | POA: Insufficient documentation

## 2017-10-14 DIAGNOSIS — Z79899 Other long term (current) drug therapy: Secondary | ICD-10-CM | POA: Insufficient documentation

## 2017-10-14 DIAGNOSIS — O24911 Unspecified diabetes mellitus in pregnancy, first trimester: Secondary | ICD-10-CM | POA: Insufficient documentation

## 2017-10-14 LAB — POCT URINALYSIS DIP (DEVICE)
Bilirubin Urine: NEGATIVE
Hgb urine dipstick: NEGATIVE
Ketones, ur: 15 mg/dL — AB
Leukocytes, UA: NEGATIVE
Nitrite: NEGATIVE
PH: 6.5 (ref 5.0–8.0)
PROTEIN: NEGATIVE mg/dL
Specific Gravity, Urine: 1.02 (ref 1.005–1.030)
UROBILINOGEN UA: 0.2 mg/dL (ref 0.0–1.0)

## 2017-10-14 MED ORDER — MICONAZOLE NITRATE 2 % VA CREA
1.0000 | TOPICAL_CREAM | Freq: Every day | VAGINAL | 0 refills | Status: DC
Start: 2017-10-14 — End: 2017-10-28

## 2017-10-14 NOTE — ED Triage Notes (Signed)
Pt c/o vaginal itching and urinary frequency, pt states "either I have a yeast infection or a UTI". Pt is [redacted] weeks pregnant.

## 2017-10-14 NOTE — ED Provider Notes (Signed)
[facility]    CSN: [id number] Arrival date & time: 10/14/17  1923     History   Chief Complaint Chief Complaint  Patient presents with  . Vaginal Itching  . Urinary Frequency    HPI Cheryl Curry is a 32 y.o. female.   32 year old female with history of diabetes, hypertension, who is [redacted] weeks pregnant comes in for few day history of vaginal itching and urinary frequency.  States she was told she had an yeast infection, and was given Flagyl for for 7 days.  States itching continued.  Denies vaginal  discharge, pain.  Denies abdominal pain/cramping, nausea, vomiting.  Denies fever, chills, night sweats.  Denies dysuria, hematuria.  States she recently followed up at the [facility], and was put on labetalol for hypertension, but has not been able to take her evening dose due to being here. She has her follow-up appointment with OB on the 23rd. On Metformin for diabetes.      Past Medical History:  Diagnosis Date  . Diabetes mellitus without complication (HCC)   . Hypertension     There are no active problems to display for this patient.   History reviewed. No pertinent surgical history.  OB History    Gravida  3   Para      Term      Preterm      AB  0   Living  2     SAB      TAB      Ectopic      Multiple      Live Births               Home Medications    Prior to Admission medications   Medication Sig Start Date End Date Taking? Authorizing Provider  acetaminophen (TYLENOL) 500 MG tablet Take 1,000 mg by mouth every 6 (six) hours as needed for mild pain.    [provider]  Cetirizine HCl 10 MG CAPS Take 1 capsule (10 mg total) by mouth daily. 08/29/17 09/28/17  Wieters, Hallie C, PA-C  fluticasone (FLONASE) 50 MCG/ACT nasal spray Place 1-2 sprays into both nostrils daily. Patient not taking: Reported on 09/30/2017 08/29/17   Wieters, Fran Lowes C, PA-C  labetalol (NORMODYNE) 200 MG tablet Take 1 tablet (200 mg  total) by mouth 2 (two) times daily. Patient not taking: Reported on 10/14/2017 09/30/17   Aviva Signs, CNM  miconazole (MONISTAT 7 SIMPLY CURE) 2 % vaginal cream Place 1 Applicatorful vaginally at bedtime. 10/14/17   Belinda Fisher, PA-C  Prenatal Vit-Fe Fumarate-FA (PRENATAL MULTIVITAMIN) TABS tablet Take 1 tablet by mouth daily at 12 noon.    [provider]  triamcinolone cream (KENALOG) 0.1 % Apply 1 application topically 2 (two) times daily. 08/29/17   Wieters, Junius Creamer, PA-C    Family History No family history on file.  Social History Social History   Tobacco Use  . Smoking status: Never Smoker  . Smokeless tobacco: Never Used  Substance Use Topics  . Alcohol use: Yes    Comment: occassional  . Drug use: No     Allergies   Patient has no known allergies.   Review of Systems Review of Systems  Reason unable to perform ROS: See HPI as above.     Physical Exam Triage Vital Signs ED Triage Vitals [10/14/17 1950]  Enc Vitals Group     BP (!) 166/100     Pulse Rate 80     Resp  16     Temp 98.6 F (37 C)     Temp src      SpO2 100 %     Weight      Height      Head Circumference      Peak Flow      Pain Score      Pain Loc      Pain Edu?      Excl. in GC?    No data found.  Updated Vital Signs BP (!) 166/100   Pulse 80   Temp 98.6 F (37 C)   Resp 16   LMP 08/08/2017 (Exact Date)   SpO2 100%   Physical Exam  Constitutional: She is oriented to person, place, and time. She appears well-developed and well-nourished. No distress.  HENT:  Head: Normocephalic and atraumatic.  Eyes: Pupils are equal, round, and reactive to light. Conjunctivae are normal.  Cardiovascular: Normal rate, regular rhythm and normal heart sounds. Exam reveals no gallop and no friction rub.  No murmur heard. Pulmonary/Chest: Effort normal and breath sounds normal. She has no wheezes. She has no rales.  Abdominal: Soft. Bowel sounds are normal. She exhibits no mass. There  is no tenderness. There is no rebound, no guarding and no CVA tenderness.  Neurological: She is alert and oriented to person, place, and time.  Skin: Skin is warm and dry.  Psychiatric: She has a normal mood and affect. Her behavior is normal. Judgment normal.    UC Treatments / Results  Labs (all labs ordered are listed, but only abnormal results are displayed) Labs Reviewed  POCT URINALYSIS DIP (DEVICE) - Abnormal; Notable for the following components:      Result Value   Glucose, UA >=1000 (*)    Ketones, ur 15 (*)    All other components within normal limits  CERVICOVAGINAL ANCILLARY ONLY    EKG None  Radiology No results found.   Procedures Procedures (including critical care time)  Medications Ordered in UC Medications - No data to display  Initial Impression / Assessment and Plan / UC Course  I have reviewed the triage vital signs and the nursing notes.  Pertinent labs & imaging results that were available during my care of the patient were reviewed by me and considered in my medical decision making (see chart for details).    Patient was treated empirically for yeast. Start miconazole as directed. Cytology sent, patient will be contacted with any positive results that require additional treatment. Patient to refrain from sexual activity for the next 7 days. Return precautions given.   Patient with elevated blood pressure, has not had her BP this evening. Without protein in urine today. Will have patient monitor closely. Strict return precautions given. Patient expresses understanding and agrees to plan.  Final Clinical Impressions(s) / UC Diagnoses   Final diagnoses:  Vaginal itching    ED Prescriptions    Medication Sig Dispense Auth. Provider   miconazole (MONISTAT 7 SIMPLY CURE) 2 % vaginal cream Place 1 Applicatorful vaginally at bedtime. 45 g Threasa Alpha, [place] 10/14/17 2100

## 2017-10-14 NOTE — Discharge Instructions (Signed)
You were treated empirically for yeast. Start miconazole vaginal applicator as directed. Cytology sent, you will be contacted with any positive results that requires further treatment. Refrain from sexual activity and alcohol use for the next 7 days. Monitor for any worsening of symptoms, fever, abdominal pain, nausea, vomiting, to follow up for reevaluation.  Please monitor your blood pressure closely, if blood pressure continues to be elevated, have abdominal cramping, vaginal bleeding, please go to the [facility] hospital for further evaluation needed.

## 2017-10-18 ENCOUNTER — Telehealth (HOSPITAL_COMMUNITY): Payer: Self-pay

## 2017-10-18 LAB — CERVICOVAGINAL ANCILLARY ONLY
BACTERIAL VAGINITIS: POSITIVE — AB
CANDIDA VAGINITIS: POSITIVE — AB
Chlamydia: NEGATIVE
Neisseria Gonorrhea: NEGATIVE
Trichomonas: NEGATIVE

## 2017-10-18 NOTE — Telephone Encounter (Signed)
Candida (yeast) is positive.  Prescription for fluconazole was given at the urgent care visit.  Pt called and made aware. Recheck or followup with PCP for further evaluation if symptoms are not improving. Answered all questions. Also, Bacterial vaginosis is positive. This was not treated at the urgent care visit.  Patient reports no symptoms at this time. Pt called and made aware of results. Answered all questions and pt verbalized understanding.

## 2017-10-20 ENCOUNTER — Encounter: Payer: Medicaid Other | Attending: Obstetrics and Gynecology | Admitting: Registered"

## 2017-10-20 DIAGNOSIS — Z713 Dietary counseling and surveillance: Secondary | ICD-10-CM | POA: Diagnosis not present

## 2017-10-20 DIAGNOSIS — O24419 Gestational diabetes mellitus in pregnancy, unspecified control: Secondary | ICD-10-CM

## 2017-10-20 DIAGNOSIS — Z3A00 Weeks of gestation of pregnancy not specified: Secondary | ICD-10-CM | POA: Insufficient documentation

## 2017-10-21 ENCOUNTER — Encounter: Payer: Self-pay | Admitting: Registered"

## 2017-10-21 DIAGNOSIS — O24919 Unspecified diabetes mellitus in pregnancy, unspecified trimester: Secondary | ICD-10-CM | POA: Insufficient documentation

## 2017-10-21 NOTE — Progress Notes (Signed)
Patient was seen on 10/20/2017 for Gestational Diabetes self-management class at [facility]. The following learning objectives were met by the patient during this course:   States the definition of Gestational Diabetes  States why dietary management is important in controlling blood glucose  Describes the effects each nutrient has on blood glucose levels  Demonstrates ability to create a balanced meal plan  Demonstrates carbohydrate counting   States when to check blood glucose levels  Demonstrates proper blood glucose monitoring techniques  States the effect of stress and exercise on blood glucose levels  States the importance of limiting caffeine and abstaining from alcohol and smoking  Blood glucose monitor given: Accu-Chek Guide Lot # [id number] Exp: 10/16/2018 Blood glucose reading: 193  Patient instructed to monitor glucose levels: FBS: 60 - <95; 1 hour: <140; 2 hour: <120  Patient received handouts:  Nutrition Diabetes and Pregnancy, including carb counting list  Patient will be seen for follow-up as needed.

## 2017-10-28 ENCOUNTER — Encounter: Payer: Self-pay | Admitting: Obstetrics and Gynecology

## 2017-10-28 ENCOUNTER — Ambulatory Visit (INDEPENDENT_AMBULATORY_CARE_PROVIDER_SITE_OTHER): Payer: Medicaid Other | Admitting: Obstetrics and Gynecology

## 2017-10-28 ENCOUNTER — Other Ambulatory Visit (HOSPITAL_COMMUNITY)
Admission: RE | Admit: 2017-10-28 | Discharge: 2017-10-28 | Disposition: A | Discharge status: Home / Self Care | Payer: Medicaid Other | Source: Ambulatory Visit | Attending: Obstetrics and Gynecology | Admitting: Obstetrics and Gynecology

## 2017-10-28 ENCOUNTER — Telehealth: Payer: Self-pay | Admitting: *Deleted

## 2017-10-28 VITALS — BP 140/98 | HR 82 | Wt 209.1 lb

## 2017-10-28 DIAGNOSIS — Z3A10 10 weeks gestation of pregnancy: Secondary | ICD-10-CM | POA: Insufficient documentation

## 2017-10-28 DIAGNOSIS — O0990 Supervision of high risk pregnancy, unspecified, unspecified trimester: Secondary | ICD-10-CM | POA: Insufficient documentation

## 2017-10-28 DIAGNOSIS — E119 Type 2 diabetes mellitus without complications: Secondary | ICD-10-CM | POA: Diagnosis not present

## 2017-10-28 DIAGNOSIS — O0991 Supervision of high risk pregnancy, unspecified, first trimester: Secondary | ICD-10-CM | POA: Diagnosis not present

## 2017-10-28 DIAGNOSIS — O09891 Supervision of other high risk pregnancies, first trimester: Secondary | ICD-10-CM | POA: Insufficient documentation

## 2017-10-28 DIAGNOSIS — O10911 Unspecified pre-existing hypertension complicating pregnancy, first trimester: Secondary | ICD-10-CM | POA: Insufficient documentation

## 2017-10-28 DIAGNOSIS — O24311 Unspecified pre-existing diabetes mellitus in pregnancy, first trimester: Secondary | ICD-10-CM | POA: Diagnosis not present

## 2017-10-28 DIAGNOSIS — O10919 Unspecified pre-existing hypertension complicating pregnancy, unspecified trimester: Secondary | ICD-10-CM

## 2017-10-28 LAB — POCT URINALYSIS DIP (DEVICE)
Bilirubin Urine: NEGATIVE
GLUCOSE, UA: 500 mg/dL — AB
Hgb urine dipstick: NEGATIVE
Ketones, ur: 15 mg/dL — AB
Leukocytes, UA: NEGATIVE
NITRITE: NEGATIVE
Protein, ur: NEGATIVE mg/dL
SPECIFIC GRAVITY, URINE: 1.02 (ref 1.005–1.030)
UROBILINOGEN UA: 0.2 mg/dL (ref 0.0–1.0)
pH: 6.5 (ref 5.0–8.0)

## 2017-10-28 MED ORDER — FOLIC ACID 1 MG PO TABS
1.0000 mg | ORAL_TABLET | Freq: Every day | ORAL | 3 refills | Status: DC
Start: 2017-10-28 — End: 2019-03-01

## 2017-10-28 MED ORDER — PRENATAL 27-0.8 MG PO TABS: 1.0000 | ORAL_TABLET | Freq: Every day | ORAL | 0 refills | Status: DC

## 2017-10-28 MED ORDER — ASPIRIN EC 81 MG PO TBEC: 81.0000 mg | DELAYED_RELEASE_TABLET | Freq: Every day | ORAL | 2 refills | Status: DC

## 2017-10-28 MED ORDER — METFORMIN HCL 1000 MG PO TABS: 1000.0000 mg | ORAL_TABLET | Freq: Two times a day (BID) | ORAL | Status: DC

## 2017-10-28 NOTE — Progress Notes (Signed)
Bedside US for FHR = 166 bpm per M-mode Yolk sac visualized

## 2017-10-28 NOTE — Progress Notes (Signed)
New OB Note  10/28/2017   [facility]inic: [facility]  Chief Complaint: NOB  Transfer of Care Patient: no  History of Present Illness: Ms. Engh is a 32 y.o. [id number] @ 10/1 weeks (EDC 12/18, based on LMP=6wk u/s Patient's last menstrual period was 08/08/2017 (exact date).).  Preg complicated by has DM2 in pregnancy; Supervision of high risk pregnancy, antepartum; Medication exposure during first trimester of pregnancy; and Chronic hypertension during pregnancy, antepartum on their problem list.   Any events prior to today's visit: vb She was using no method when she conceived.  She has Negative signs or symptoms of nausea/vomiting of pregnancy. She has Negative signs or symptoms of miscarriage or preterm labor On any medications around the time she conceived/early pregnancy: Yes, metformin, lisinopril, hctz  ROS: A 12-point review of systems was performed and negative, except as stated in the above HPI.  OBGYN History: As per HPI. OB History  Gravida Para Term Preterm AB Living  0 2  SAB TAB Ectopic Multiple Live Births          2    # Outcome Date GA Lbr Len/2nd Weight Sex Delivery Anes PTL Lv  3 Current           2 Term 11/23/06 [redacted]w[redacted]d  7 lb (3.175 kg) F Vag-Spont None N LIV  1 Term 04/18/03 [redacted]w[redacted]d  6 lb 12 oz (3.062 kg) F Vag-Spont EPI  LIV   Any issues with any prior pregnancies: Yes, h/o GDM Prior children are healthy, doing well, and without any problems or issues: yes History of pap smears: unsure.    Past Medical History: Past Medical History:  Diagnosis Date  . Diabetes mellitus without complication (HCC)   . Hypertension     Past Surgical History: History reviewed. No pertinent surgical history.  Family History:  History reviewed. No pertinent family history. She denies any female cancers, bleeding or blood clotting disorders.  She denies any history of mental retardation, birth defects or genetic disorders in her or the FOB's  history  Social History:  Social History   Socioeconomic History  . Marital status: Married    Spouse name: Not on file  . Number of children: Not on file  . Years of education: Not on file  . Highest education level: Not on file  Occupational History  . Not on file  Social Needs  . Financial resource strain: Not on file  . Food insecurity:    Worry: Not on file    Inability: Not on file  . Transportation needs:    Medical: Not on file    Non-medical: Not on file  Tobacco Use  . Smoking status: Never Smoker  . Smokeless tobacco: Never Used  Substance and Sexual Activity  . Alcohol use: Yes    Comment: occassional  . Drug use: No  . Sexual activity: Yes    Birth control/protection: None  Lifestyle  . Physical activity:    Days per week: Not on file    Minutes per session: Not on file  . Stress: Not on file  Relationships  . Social connections:    Talks on phone: Not on file    Gets together: Not on file    Attends religious service: Not on file    Active member of club or organization: Not on file    Attends meetings of clubs or organizations: Not on file    Relationship status: Not on file  .  Intimate partner violence:    Fear of current or ex partner: Not on file    Emotionally abused: Not on file    Physically abused: Not on file    Forced sexual activity: Not on file  Other Topics Concern  . Not on file  Social History Narrative   ** Merged History Encounter **        Allergy: No Known Allergies  Health Maintenance:  Mammogram Up to Date: not applicable  Current Outpatient Medications: PNV, labetalol 200/200  Physical Exam:   BP (!) 140/98   Pulse 82   Wt 209 lb 1.6 oz (94.8 kg)   LMP 08/08/2017 (Exact Date)   BMI 33.75 kg/m  Body mass index is 33.75 kg/m. Contractions: Not present Vag. Bleeding: None. Fundal height: not applicable FHTs: 160s  General appearance: Well nourished, well developed female in no acute distress.  Neck:   Supple, normal appearance, and no thyromegaly  Cardiovascular: S1, S2 normal, no murmur, rub or gallop, regular rate and rhythm Respiratory:  Clear to auscultation bilateral. Normal respiratory effort Abdomen: positive bowel sounds and no masses, hernias; diffusely non tender to palpation, non distended Breasts: pt denies any s/s Neuro/Psych:  Normal mood and affect.  Skin:  Warm and dry.  Lymphatic:  No inguinal lymphadenopathy.   Pelvic exam: is not limited by body habitus EGBUS: within normal limits, Vagina: within normal limits and with no blood in the vault but w/ white cottage cheese d/c in vault, Cervix: normal appearing cervix without discharge or lesions, closed/long/high, Uterus:  enlarged, c/w 10 week size, and Adnexa:  normal adnexa and no mass, fullness, tenderness  Laboratory: reviewed  Imaging:  Bedside u/s with sliup, normal fhr c/w 10wks  Assessment: pt stable  Plan: 1. Supervision of high risk pregnancy, antepartum Routine care. cffdna today - CHL AMB BABYSCRIPTS OPT IN - Culture, OB Urine - Cystic fibrosis gene test - Genetic Screening - Hemoglobinopathy Evaluation - SMN1 COPY NUMBER ANALYSIS (SMA Carrier Screen) - Obstetric Panel, Including HIV - Hemoglobin A1c - Protein / creatinine ratio, urine - Comprehensive metabolic panel - [place] MFM OB DETAIL +14 WK; Future - Cytology - PAP - TSH - Hepatitis C Antibody  2. Medication exposure during first trimester of pregnancy F/u detailed u/s. Changed from lisinopril to labetalol on 4/25  3. DM2 Patient seen by dm education, but not checking her BS. She says that she check one am fasting today was 200. Stressed importance of good BS control in terms of maternal fetal health. Recommend she restart her metformin (she was already on 1000/1000) but I told her she'll likely need to start insulin (she's used it before) when we see her again. Will need fetal echo  4. cHTN Start low dose asa in 2-3wks. Continue labetalol  200/200.  Problem list reviewed and updated.  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   Follow up in 1.5 weeks.   >50% of 30 min visit spent on counseling and coordination of care.     Cornelia Copa MD Attending [facility]) Restart metformin

## 2017-10-28 NOTE — Progress Notes (Signed)
Anatomy US scheduled for July 25th @ 0930.  Pt notified.  Panorama shipped via [employer] today Home Medicaid Form Completed

## 2017-10-28 NOTE — Telephone Encounter (Signed)
Cheryl Curry called and left a voicemessage this am that she was seen by Dr. Vergie Living and he was supposed to send rx for PNV to her pharmacy but he didn't. Would like it sent today. Will send RX and will send patient a MyChart message.

## 2017-10-29 LAB — PROTEIN / CREATININE RATIO, URINE
Creatinine, Urine: 88 mg/dL
PROTEIN UR: 9.2 mg/dL
Protein/Creat Ratio: 105 mg/g creat (ref 0–200)

## 2017-11-02 ENCOUNTER — Encounter: Payer: Self-pay | Admitting: *Deleted

## 2017-11-02 LAB — CYTOLOGY - PAP
BACTERIAL VAGINITIS: POSITIVE — AB
CANDIDA VAGINITIS: NEGATIVE
CHLAMYDIA, DNA PROBE: NEGATIVE
Diagnosis: NEGATIVE
HPV: NOT DETECTED
Neisseria Gonorrhea: NEGATIVE
Trichomonas: NEGATIVE

## 2017-11-03 LAB — URINE CULTURE, OB REFLEX

## 2017-11-03 LAB — CULTURE, OB URINE

## 2017-11-04 ENCOUNTER — Telehealth: Payer: Self-pay | Admitting: General Practice

## 2017-11-04 ENCOUNTER — Encounter: Payer: Self-pay | Admitting: Obstetrics and Gynecology

## 2017-11-04 ENCOUNTER — Encounter: Payer: Self-pay | Admitting: *Deleted

## 2017-11-04 DIAGNOSIS — B9689 Other specified bacterial agents as the cause of diseases classified elsewhere: Secondary | ICD-10-CM

## 2017-11-04 DIAGNOSIS — N760 Acute vaginitis: Principal | ICD-10-CM

## 2017-11-04 DIAGNOSIS — R8271 Bacteriuria: Secondary | ICD-10-CM | POA: Insufficient documentation

## 2017-11-04 LAB — HEMOGLOBINOPATHY EVALUATION
FERRITIN: 95 ng/mL (ref 15–150)
HGB SOLUBILITY: NEGATIVE
Hgb A2 Quant: 2.1 % (ref 1.8–3.2)
Hgb A: 96.8 % (ref 96.4–98.8)
Hgb C: 0 %
Hgb F Quant: 1.1 % (ref 0.0–2.0)
Hgb S: 0 %
Hgb Variant: 0 %

## 2017-11-04 LAB — SMN1 COPY NUMBER ANALYSIS (SMA CARRIER SCREENING)

## 2017-11-04 LAB — OBSTETRIC PANEL, INCLUDING HIV
Antibody Screen: NEGATIVE
BASOS ABS: 0 10*3/uL (ref 0.0–0.2)
Basos: 0 %
EOS (ABSOLUTE): 0.1 10*3/uL (ref 0.0–0.4)
Eos: 1 %
HIV SCREEN 4TH GENERATION: NONREACTIVE
Hematocrit: 36.2 % (ref 34.0–46.6)
Hemoglobin: 12.1 g/dL (ref 11.1–15.9)
Hepatitis B Surface Ag: NEGATIVE
Immature Grans (Abs): 0 10*3/uL (ref 0.0–0.1)
Immature Granulocytes: 0 %
Lymphocytes Absolute: 1.7 10*3/uL (ref 0.7–3.1)
Lymphs: 25 %
MCH: 31.2 pg (ref 26.6–33.0)
MCHC: 33.4 g/dL (ref 31.5–35.7)
MCV: 93 fL (ref 79–97)
MONOCYTES: 6 %
Monocytes Absolute: 0.4 10*3/uL (ref 0.1–0.9)
NEUTROS ABS: 4.5 10*3/uL (ref 1.4–7.0)
Neutrophils: 68 %
PLATELETS: 254 10*3/uL (ref 150–450)
RBC: 3.88 x10E6/uL (ref 3.77–5.28)
RDW: 13.7 % (ref 12.3–15.4)
RPR Ser Ql: NONREACTIVE
RUBELLA: 4.72 {index} (ref >0.99)
Rh Factor: POSITIVE
WBC: 6.7 10*3/uL (ref 3.4–10.8)

## 2017-11-04 LAB — COMPREHENSIVE METABOLIC PANEL
A/G RATIO: 1.6 (ref 1.2–2.2)
ALK PHOS: 45 IU/L (ref 39–117)
ALT: 18 IU/L (ref 0–32)
AST: 19 IU/L (ref 0–40)
Albumin: 3.9 g/dL (ref 3.5–5.5)
BILIRUBIN TOTAL: 0.3 mg/dL (ref 0.0–1.2)
BUN/Creatinine Ratio: 10 (ref 9–23)
BUN: 7 mg/dL (ref 6–20)
CALCIUM: 9.4 mg/dL (ref 8.7–10.2)
CO2: 18 mmol/L — ABNORMAL LOW (ref 20–29)
Chloride: 100 mmol/L (ref 96–106)
Creatinine, Ser: 0.7 mg/dL (ref 0.57–1.00)
GFR calc Af Amer: 132 mL/min/{1.73_m2} (ref >59)
GFR calc non Af Amer: 114 mL/min/{1.73_m2} (ref >59)
Globulin, Total: 2.5 g/dL (ref 1.5–4.5)
Glucose: 258 mg/dL — ABNORMAL HIGH (ref 65–99)
POTASSIUM: 4.3 mmol/L (ref 3.5–5.2)
Sodium: 133 mmol/L — ABNORMAL LOW (ref 134–144)
Total Protein: 6.4 g/dL (ref 6.0–8.5)

## 2017-11-04 LAB — HEMOGLOBIN A1C
ESTIMATED AVERAGE GLUCOSE: 280 mg/dL
HEMOGLOBIN A1C: 11.4 % — AB (ref 4.8–5.6)

## 2017-11-04 LAB — CYSTIC FIBROSIS GENE TEST

## 2017-11-04 LAB — HEPATITIS C ANTIBODY

## 2017-11-04 LAB — TSH: TSH: 1.58 u[IU]/mL (ref 0.450–4.500)

## 2017-11-04 MED ORDER — CEPHALEXIN 500 MG PO CAPS: 500.0000 mg | ORAL_CAPSULE | Freq: Three times a day (TID) | ORAL | 0 refills | Status: AC

## 2017-11-04 NOTE — Telephone Encounter (Signed)
Patient called and left message on nurse voicemail line requesting results.

## 2017-11-04 NOTE — Addendum Note (Signed)
Addended by: [place] Bing on: 11/04/2017 11:42 AM   Modules accepted: Orders

## 2017-11-05 ENCOUNTER — Telehealth: Payer: Self-pay | Admitting: *Deleted

## 2017-11-05 MED ORDER — METRONIDAZOLE 500 MG PO TABS: 500.0000 mg | ORAL_TABLET | Freq: Two times a day (BID) | ORAL | 0 refills | Status: DC

## 2017-11-05 NOTE — Telephone Encounter (Signed)
Cheryl Curry called and left a voice mail that she is calling re; results , has looked in MyChart and has questions.

## 2017-11-05 NOTE — Telephone Encounter (Signed)
Called patient & informed her of blood test results & BV seen on pap. Informed her of medication sent to pharmacy. Patient verbalized understanding & states she feels like she constantly has yeast. Told patient getting her blood sugars under control will help with that as yeast is common with elevated blood sugar readings and her A1C was quite high. Patient verbalized understanding & had no other questions.

## 2017-11-08 ENCOUNTER — Ambulatory Visit (INDEPENDENT_AMBULATORY_CARE_PROVIDER_SITE_OTHER): Payer: Medicaid Other | Admitting: Obstetrics & Gynecology

## 2017-11-08 VITALS — BP 144/93 | HR 83 | Wt 206.0 lb

## 2017-11-08 DIAGNOSIS — O10919 Unspecified pre-existing hypertension complicating pregnancy, unspecified trimester: Secondary | ICD-10-CM

## 2017-11-08 DIAGNOSIS — O24911 Unspecified diabetes mellitus in pregnancy, first trimester: Secondary | ICD-10-CM

## 2017-11-08 DIAGNOSIS — O0990 Supervision of high risk pregnancy, unspecified, unspecified trimester: Secondary | ICD-10-CM

## 2017-11-08 DIAGNOSIS — O99210 Obesity complicating pregnancy, unspecified trimester: Secondary | ICD-10-CM | POA: Insufficient documentation

## 2017-11-08 LAB — POCT URINALYSIS DIP (DEVICE)
Bilirubin Urine: NEGATIVE
Glucose, UA: 500 mg/dL — AB
HGB URINE DIPSTICK: NEGATIVE
KETONES UR: 15 mg/dL — AB
Leukocytes, UA: NEGATIVE
Nitrite: NEGATIVE
PH: 6 (ref 5.0–8.0)
PROTEIN: NEGATIVE mg/dL
SPECIFIC GRAVITY, URINE: 1.025 (ref 1.005–1.030)
Urobilinogen, UA: 0.2 mg/dL (ref 0.0–1.0)

## 2017-11-08 MED ORDER — GLUCOSE BLOOD VI STRP: ORAL_STRIP | 12 refills | Status: DC

## 2017-11-08 MED ORDER — ACCU-CHEK SOFTCLIX LANCETS MISC: 12 refills | Status: DC

## 2017-11-08 MED ORDER — ACCU-CHEK FASTCLIX LANCETS MISC: 1.0000 | Freq: Four times a day (QID) | 12 refills | Status: DC

## 2017-11-08 MED ORDER — LABETALOL HCL 200 MG PO TABS: 400.0000 mg | ORAL_TABLET | Freq: Two times a day (BID) | ORAL | 3 refills | Status: DC

## 2017-11-08 MED ORDER — ACCU-CHEK FASTCLIX LANCETS MISC: Status: DC

## 2017-11-08 NOTE — Telephone Encounter (Signed)
Patient came into office & questions were addressed.

## 2017-11-08 NOTE — Addendum Note (Signed)
Addended by: Kathee DeltonHILLMAN, CARRIE L on: 11/08/2017 01:06 PM   Modules accepted: Orders

## 2017-11-08 NOTE — Progress Notes (Signed)
   PRENATAL VISIT NOTE  Subjective:  Cheryl Curry is a 32 y.o. G3P2002 at 1775w5d being seen today for ongoing prenatal care.  She is currently monitored for the following issues for this high-risk pregnancy and has DM2 in pregnancy; Supervision of high risk pregnancy, antepartum; Medication exposure during first trimester of pregnancy; Chronic hypertension during pregnancy, antepartum; GBS bacteriuria; and Obesity in pregnancy on their problem list.  Patient reports no complaints.  Contractions: Not present. Vag. Bleeding: None.  Movement: Absent. Denies leaking of fluid.   The following portions of the patient's history were reviewed and updated as appropriate: allergies, current medications, past family history, past medical history, past social history, past surgical history and problem list. Problem list updated.  Objective:   Vitals:   11/08/17 1046  BP: (!) 144/93  Pulse: 83  Weight: 206 lb (93.4 kg)    Fetal Status: Fetal Heart Rate (bpm): 160   Movement: Absent     General:  Alert, oriented and cooperative. Patient is in no acute distress.  Skin: Skin is warm and dry. No rash noted.   Cardiovascular: Normal heart rate noted  Respiratory: Normal respiratory effort, no problems with respiration noted  Abdomen: Soft, gravid, appropriate for gestational age.  Pain/Pressure: Absent     Pelvic: Cervical exam deferred        Extremities: Normal range of motion.  Edema: None  Mental Status: Normal mood and affect. Normal behavior. Normal judgment and thought content.   Assessment and Plan:  Pregnancy: G3P2002 at 7175w5d  1. Chronic hypertension during pregnancy, antepartum - I will increase her labetolol to 400 mg BID - start baby asa, discussed that it is to help prevent pre eclampsia  2. Diabetes mellitus affecting pregnancy in first trimester - She didn't bring meter today, hasn't checked sugars recently as she needs lancets - I have really encouraged her to bring  meter next so that we can (most likely) start insulin - have re prescribed lancets and test strips  3. Supervision of high risk pregnancy, antepartum   4. Obesity in pregnancy -- She saw the diabetes nutritionist  Preterm labor symptoms and general obstetric precautions including but not limited to vaginal bleeding, contractions, leaking of fluid and fetal movement were reviewed in detail with the patient. Please refer to After Visit Summary for other counseling recommendations.  Return in about 1 week (around 11/15/2017).  Future Appointments  Date Time Provider Department Center  12/30/2017  9:30 AM [facility] US 1 [facility]    Allie BossierMyra C Yash Cacciola, MD

## 2017-11-12 ENCOUNTER — Inpatient Hospital Stay (HOSPITAL_COMMUNITY)
Admission: AD | Admit: 2017-11-12 | Discharge: 2017-11-13 | Disposition: A | Discharge status: Home / Self Care | Payer: Medicaid Other | Source: Ambulatory Visit | Attending: Obstetrics & Gynecology | Admitting: Obstetrics & Gynecology

## 2017-11-12 DIAGNOSIS — Z7982 Long term (current) use of aspirin: Secondary | ICD-10-CM | POA: Diagnosis not present

## 2017-11-12 DIAGNOSIS — Z7984 Long term (current) use of oral hypoglycemic drugs: Secondary | ICD-10-CM | POA: Insufficient documentation

## 2017-11-12 DIAGNOSIS — O10011 Pre-existing essential hypertension complicating pregnancy, first trimester: Secondary | ICD-10-CM | POA: Diagnosis not present

## 2017-11-12 DIAGNOSIS — O208 Other hemorrhage in early pregnancy: Secondary | ICD-10-CM

## 2017-11-12 DIAGNOSIS — O24111 Pre-existing diabetes mellitus, type 2, in pregnancy, first trimester: Secondary | ICD-10-CM | POA: Diagnosis not present

## 2017-11-12 DIAGNOSIS — E119 Type 2 diabetes mellitus without complications: Secondary | ICD-10-CM | POA: Diagnosis not present

## 2017-11-12 DIAGNOSIS — O10911 Unspecified pre-existing hypertension complicating pregnancy, first trimester: Secondary | ICD-10-CM | POA: Diagnosis not present

## 2017-11-12 DIAGNOSIS — Z3A12 12 weeks gestation of pregnancy: Secondary | ICD-10-CM | POA: Insufficient documentation

## 2017-11-12 DIAGNOSIS — Z3490 Encounter for supervision of normal pregnancy, unspecified, unspecified trimester: Secondary | ICD-10-CM

## 2017-11-12 DIAGNOSIS — O10919 Unspecified pre-existing hypertension complicating pregnancy, unspecified trimester: Secondary | ICD-10-CM

## 2017-11-12 DIAGNOSIS — O209 Hemorrhage in early pregnancy, unspecified: Secondary | ICD-10-CM | POA: Insufficient documentation

## 2017-11-12 NOTE — MAU Note (Signed)
Vaginal bleeding like a period started about 2330. Mild cramping and some nausea

## 2017-11-13 ENCOUNTER — Inpatient Hospital Stay (HOSPITAL_COMMUNITY): Payer: Medicaid Other

## 2017-11-13 ENCOUNTER — Encounter (HOSPITAL_COMMUNITY): Payer: Self-pay

## 2017-11-13 DIAGNOSIS — O10911 Unspecified pre-existing hypertension complicating pregnancy, first trimester: Secondary | ICD-10-CM

## 2017-11-13 DIAGNOSIS — O209 Hemorrhage in early pregnancy, unspecified: Secondary | ICD-10-CM | POA: Diagnosis not present

## 2017-11-13 LAB — URINALYSIS, ROUTINE W REFLEX MICROSCOPIC
BACTERIA UA: NONE SEEN
BILIRUBIN URINE: NEGATIVE
Glucose, UA: >=500 mg/dL — AB
KETONES UR: NEGATIVE mg/dL
LEUKOCYTES UA: NEGATIVE
Nitrite: NEGATIVE
Protein, ur: NEGATIVE mg/dL
Specific Gravity, Urine: 1.008 (ref 1.005–1.030)
pH: 7 (ref 5.0–8.0)

## 2017-11-13 LAB — WET PREP, GENITAL
Clue Cells Wet Prep HPF POC: NONE SEEN
Sperm: NONE SEEN
TRICH WET PREP: NONE SEEN
Yeast Wet Prep HPF POC: NONE SEEN

## 2017-11-13 MED ORDER — LABETALOL HCL 100 MG PO TABS
600.0000 mg | ORAL_TABLET | Freq: Once | ORAL | Status: AC
  Administered 2017-11-13: 600 mg via ORAL
  Filled 2017-11-13: qty 6

## 2017-11-13 MED ORDER — LABETALOL HCL 200 MG PO TABS: 600.0000 mg | ORAL_TABLET | Freq: Three times a day (TID) | ORAL | 3 refills | Status: DC

## 2017-11-13 NOTE — MAU Provider Note (Signed)
History     CSN: [id number]  Arrival date and time: 11/12/17 2348   First Provider Initiated Contact with Patient 11/13/17 0020      Chief Complaint  Patient presents with  . Vaginal Bleeding  . Nausea   HPI Cheryl Curry is a 32 y.o. G3P2002 at 2846w3d who presents with vaginal bleeding. She states she started having bright red vaginal bleeding tonight that was heavy like a period. She reports some cramping earlier but no pain now. She has chronic hypertension and takes labetalol 400mg  PO BID. She denies any headache, shortness of breath or chest pain. She also has type 2 DM and takes metformin.   OB History    Gravida  3   Para  2   Term  2   Preterm      AB  0   Living  2     SAB      TAB      Ectopic      Multiple      Live Births  2           Past Medical History:  Diagnosis Date  . Diabetes mellitus without complication (HCC)   . Hypertension     History reviewed. No pertinent surgical history.  No family history on file.  Social History   Tobacco Use  . Smoking status: Never Smoker  . Smokeless tobacco: Never Used  Substance Use Topics  . Alcohol use: Yes    Comment: occassional  . Drug use: No    Allergies: No Known Allergies  Medications Prior to Admission  Medication Sig Dispense Refill Last Dose  . [START ON 11/18/2017] aspirin EC 81 MG tablet Take 1 tablet (81 mg total) by mouth daily. 60 tablet 2 11/12/2017 at Unknown time  . folic acid (FOLVITE) 1 MG tablet Take 1 tablet (1 mg total) by mouth daily. 60 tablet 3 11/12/2017 at Unknown time  . labetalol (NORMODYNE) 200 MG tablet Take 2 tablets (400 mg total) by mouth 2 (two) times daily. 60 tablet 3 11/12/2017 at Unknown time  . metFORMIN (GLUCOPHAGE) 1000 MG tablet Take 1 tablet (1,000 mg total) by mouth 2 (two) times daily with a meal.   11/12/2017 at Unknown time  . metroNIDAZOLE (FLAGYL) 500 MG tablet Take 1 tablet (500 mg total) by mouth 2 (two) times daily. 14 tablet 0  11/12/2017 at Unknown time  . Prenatal Vit-Fe Fumarate-FA (MULTIVITAMIN-PRENATAL) 27-0.8 MG TABS tablet Take 1 tablet by mouth daily at 12 noon. 30 each 0 11/12/2017 at Unknown time  . ACCU-CHEK FASTCLIX LANCETS MISC 1 Device by Percutaneous route 4 (four) times daily. 100 each 12   . acetaminophen (TYLENOL) 500 MG tablet Take 1,000 mg by mouth every 6 (six) hours as needed for mild pain.   not taking  . glucose blood test strip Use as instructed 100 each 12     Review of Systems  Constitutional: Negative.  Negative for fatigue and fever.  HENT: Negative.   Respiratory: Negative.  Negative for shortness of breath.   Cardiovascular: Negative.  Negative for chest pain.  Gastrointestinal: Negative.  Negative for abdominal pain, constipation, diarrhea, nausea and vomiting.  Genitourinary: Positive for vaginal bleeding. Negative for dysuria and vaginal discharge.  Neurological: Negative.  Negative for dizziness and headaches.   Physical Exam   Blood pressure (!) 159/113, pulse 95, temperature 98.2 F (36.8 C), resp. rate 18, height 5\' 6"  (1.676 m), weight 211 lb (95.7 kg), last menstrual period 08/08/2017.  Physical Exam  Nursing note and vitals reviewed. Constitutional: She is oriented to person, place, and time. She appears well-developed and well-nourished. No distress.  HENT:  Head: Normocephalic.  Eyes: Pupils are equal, round, and reactive to light.  Cardiovascular: Normal rate, regular rhythm and normal heart sounds.  Respiratory: Effort normal and breath sounds normal. No respiratory distress.  GI: Soft. Bowel sounds are normal. She exhibits no distension. There is no tenderness.  Genitourinary:  Genitourinary Comments: Small amount of bright red bleeding from cervical os.  Neurological: She is alert and oriented to person, place, and time.  Skin: Skin is warm and dry.  Psychiatric: She has a normal mood and affect. Her behavior is normal. Judgment and thought content normal.    Dilation: Closed Effacement (%): Thick Cervical Position: Posterior Exam by:: Ma Hillock CNM  FHT: 163 bpm  MAU Course  Procedures Results for orders placed or performed during the hospital encounter of 11/12/17 (from the past 24 hour(s))  Urinalysis, Routine w reflex microscopic     Status: Abnormal   Collection Time: 11/12/17 11:55 PM  Result Value Ref Range   Color, Urine STRAW (A) YELLOW   APPearance CLEAR CLEAR   Specific Gravity, Urine 1.008 1.005 - 1.030   pH 7.0 5.0 - 8.0   Glucose, UA >=500 (A) NEGATIVE mg/dL   Hgb urine dipstick MODERATE (A) NEGATIVE   Bilirubin Urine NEGATIVE NEGATIVE   Ketones, ur NEGATIVE NEGATIVE mg/dL   Protein, ur NEGATIVE NEGATIVE mg/dL   Nitrite NEGATIVE NEGATIVE   Leukocytes, UA NEGATIVE NEGATIVE   RBC / HPF 11-20 0 - 5 RBC/hpf   WBC, UA 0-5 0 - 5 WBC/hpf   Bacteria, UA NONE SEEN NONE SEEN   Squamous Epithelial / LPF 0-5 0 - 5   Mucus PRESENT   Wet prep, genital     Status: Abnormal   Collection Time: 11/13/17 12:21 AM  Result Value Ref Range   Yeast Wet Prep HPF POC NONE SEEN NONE SEEN   Trich, Wet Prep NONE SEEN NONE SEEN   Clue Cells Wet Prep HPF POC NONE SEEN NONE SEEN   WBC, Wet Prep HPF POC FEW (A) NONE SEEN   Sperm NONE SEEN    US Ob Comp Less 14 Wks  Result Date: 11/13/2017 CLINICAL DATA:  Vaginal bleeding and cramping. Quantitative HCG not done. Quantitative beta HCG 09/30/17, 11,754. Estimated gestational age per LMP 13 weeks 6 days. EXAM: OBSTETRIC <14 WK ULTRASOUND TECHNIQUE: Transabdominal ultrasound was performed for evaluation of the gestation as well as the maternal uterus and adnexal regions. COMPARISON:  09/30/2017 FINDINGS: Intrauterine gestational sac: Single visualized. Yolk sac:  Not visualized. Embryo:  Visualized. Cardiac Activity: Visualized. Heart Rate: 162 bpm CRL: 61.3 mm   12 w 4 d                  [place] EDC: 05/24/2018 Subchorionic hemorrhage:  None visualized. Maternal uterus/adnexae: Ovaries are normal.  No free  fluid. IMPRESSION: Single live IUP with estimated gestational age [redacted] weeks 4 days. Electronically Signed   By: Elberta Fortis M.D.   On: 11/13/2017 01:18   MDM UA Wet prep and gc/chlamydia US OB Transvaginal  Labetalol 600mg  PO  Blood pressures reviewed with Dr. Despina Hidden- will increase labetalol to 600mg  TID and encourage patient to check BP 4x a day. Will have patient come for BP check next week.   Assessment and Plan   1. Normal intrauterine pregnancy on prenatal ultrasound, antepartum   2. Vaginal  bleeding affecting early pregnancy   3. Chronic hypertension during pregnancy, antepartum    -Discharge home in stable condition -Vaginal bleeding and pain precautions discussed -Patient advised to [facility] with [facility] in the next week for a BP check, message sent -Patient may return to MAU as needed or if her condition were to change or worsen  Rolm Bookbinder CNM 11/13/2017, 12:20 AM

## 2017-11-13 NOTE — Discharge Instructions (Signed)

## 2017-11-15 ENCOUNTER — Ambulatory Visit (INDEPENDENT_AMBULATORY_CARE_PROVIDER_SITE_OTHER): Payer: Medicaid Other | Admitting: Obstetrics and Gynecology

## 2017-11-15 ENCOUNTER — Encounter: Payer: Self-pay | Admitting: Obstetrics and Gynecology

## 2017-11-15 VITALS — BP 134/90 | HR 79 | Wt 207.3 lb

## 2017-11-15 DIAGNOSIS — O99210 Obesity complicating pregnancy, unspecified trimester: Secondary | ICD-10-CM

## 2017-11-15 DIAGNOSIS — O09891 Supervision of other high risk pregnancies, first trimester: Secondary | ICD-10-CM

## 2017-11-15 DIAGNOSIS — R8271 Bacteriuria: Secondary | ICD-10-CM

## 2017-11-15 DIAGNOSIS — O24911 Unspecified diabetes mellitus in pregnancy, first trimester: Secondary | ICD-10-CM

## 2017-11-15 DIAGNOSIS — O0990 Supervision of high risk pregnancy, unspecified, unspecified trimester: Secondary | ICD-10-CM

## 2017-11-15 DIAGNOSIS — O10919 Unspecified pre-existing hypertension complicating pregnancy, unspecified trimester: Secondary | ICD-10-CM

## 2017-11-15 LAB — GC/CHLAMYDIA PROBE AMP (~~LOC~~) NOT AT ARMC
Chlamydia: NEGATIVE
NEISSERIA GONORRHEA: NEGATIVE

## 2017-11-15 NOTE — Progress Notes (Signed)
   PRENATAL VISIT NOTE  Subjective:  Cheryl Curry is a 32 y.o. G3P2002 at 5395w5d being seen today for ongoing prenatal care.  She is currently monitored for the following issues for this high-risk pregnancy and has DM2 in pregnancy; Supervision of high risk pregnancy, antepartum; Medication exposure during first trimester of pregnancy; Chronic hypertension during pregnancy, antepartum; GBS bacteriuria; and Obesity in pregnancy on their problem list.  Patient reports no complaints.  Contractions: Not present. Vag. Bleeding: Scant.  Movement: Absent. Denies leaking of fluid.   The following portions of the patient's history were reviewed and updated as appropriate: allergies, current medications, past family history, past medical history, past social history, past surgical history and problem list. Problem list updated.  Objective:   Vitals:   11/15/17 1002  BP: 134/90  Pulse: 79  Weight: 207 lb 4.8 oz (94 kg)    Fetal Status: Fetal Heart Rate (bpm): 159   Movement: Absent     General:  Alert, oriented and cooperative. Patient is in no acute distress.  Skin: Skin is warm and dry. No rash noted.   Cardiovascular: Normal heart rate noted  Respiratory: Normal respiratory effort, no problems with respiration noted  Abdomen: Soft, gravid, appropriate for gestational age.  Pain/Pressure: Absent     Pelvic: Cervical exam deferred        Extremities: Normal range of motion.  Edema: None  Mental Status: Normal mood and affect. Normal behavior. Normal judgment and thought content.   Assessment and Plan:  Pregnancy: G3P2002 at 6295w5d  1. Chronic hypertension during pregnancy, antepartum Labetalol 600 mg BID - recently increased in MAU from 400 mg BID - patient has been taking as 200 mg TID, BP controlled today, will keep as 200 mg TID for now   2. Diabetes mellitus affecting pregnancy in first trimester On metformin 1000 mg BID FG all > 150s, recommended patient to start insulin,  she is agreeable Referral made for insulin teaching  3. Medication exposure during first trimester of pregnancy On lisinopril until 4/25  4. Supervision of high risk pregnancy, antepartum Redraw panorama next visit - insufficient fetal fraction  5. GBS bacteriuria ppx in labor  6. Obesity in pregnancy   Preterm labor symptoms and general obstetric precautions including but not limited to vaginal bleeding, contractions, leaking of fluid and fetal movement were reviewed in detail with the patient. Please refer to After Visit Summary for other counseling recommendations.  Return in about 2 weeks (around 11/29/2017) for OB visit (MD).  Future Appointments  Date Time Provider Department Center  12/30/2017  9:30 AM [facility] US 1 [facility]    Conan BowensKelly M Kaede Clendenen, MD

## 2017-11-24 ENCOUNTER — Inpatient Hospital Stay (HOSPITAL_COMMUNITY)
Admission: AD | Admit: 2017-11-24 | Discharge: 2017-11-24 | Disposition: A | Discharge status: Home / Self Care | Payer: Medicaid Other | Source: Ambulatory Visit | Attending: Obstetrics & Gynecology | Admitting: Obstetrics & Gynecology

## 2017-11-24 ENCOUNTER — Other Ambulatory Visit: Payer: Self-pay

## 2017-11-24 ENCOUNTER — Telehealth: Payer: Self-pay | Admitting: *Deleted

## 2017-11-24 DIAGNOSIS — O10912 Unspecified pre-existing hypertension complicating pregnancy, second trimester: Secondary | ICD-10-CM | POA: Diagnosis not present

## 2017-11-24 DIAGNOSIS — O10919 Unspecified pre-existing hypertension complicating pregnancy, unspecified trimester: Secondary | ICD-10-CM

## 2017-11-24 DIAGNOSIS — Z7984 Long term (current) use of oral hypoglycemic drugs: Secondary | ICD-10-CM | POA: Diagnosis not present

## 2017-11-24 DIAGNOSIS — O162 Unspecified maternal hypertension, second trimester: Secondary | ICD-10-CM | POA: Insufficient documentation

## 2017-11-24 DIAGNOSIS — Z3A14 14 weeks gestation of pregnancy: Secondary | ICD-10-CM | POA: Diagnosis not present

## 2017-11-24 DIAGNOSIS — O0990 Supervision of high risk pregnancy, unspecified, unspecified trimester: Secondary | ICD-10-CM

## 2017-11-24 DIAGNOSIS — O99210 Obesity complicating pregnancy, unspecified trimester: Secondary | ICD-10-CM

## 2017-11-24 DIAGNOSIS — O24912 Unspecified diabetes mellitus in pregnancy, second trimester: Secondary | ICD-10-CM | POA: Insufficient documentation

## 2017-11-24 DIAGNOSIS — Z7982 Long term (current) use of aspirin: Secondary | ICD-10-CM | POA: Insufficient documentation

## 2017-11-24 NOTE — Telephone Encounter (Signed)
-----   Message from Marti SleighSctaria J Malloy, [place] sent at 11/24/2017 11:54 AM EDT ----- Patient called in regards to her blood pressure.  She was seen at her local hospital and was told to follow up with her doctor to possibly change BP medication.

## 2017-11-24 NOTE — Telephone Encounter (Signed)
I spoke w/pt when she called office regarding concerns for elevated BP.  She reports that she was seen @ [facility] [facility] yesterday at which time her BP was high (180/160 per pt report). She states that the doctor in the ED tried to contact someone "here" and was unable to speak with anyone. It was mentioned to her that she may need a change in her BP medication. Eventually she was discharged because her BP got "better". Pt has checked her BP @ home today and reports it was 150/108. She denies H/A or visual disturbances. Pt was advised that she needs complete evaluation of BP today @ MAU and may need labs done.  Pt voiced understanding and agreed to come to hospital as advised.

## 2017-11-24 NOTE — MAU Note (Signed)
Just came in to get BP rechecked. Been feeling sleepy, headache.

## 2017-11-24 NOTE — MAU Provider Note (Signed)
History     CSN: [id number]  Arrival date and time: 11/24/17 1655   First Provider Initiated Contact with Patient 11/24/17 1814      Chief Complaint  Patient presents with  . Blood Pressure Check   HPI  Ms.  Cheryl Curry is a 32 y.o. year old G65P2002 female at [redacted]w[redacted]d weeks gestation who presents to MAU reporting she was "told to come here for BP check by the RN in [facility]". She states she was that she was seen at a hospital in [place], [place] last night and her BP was "very high". She was eventually discharged home, because her "BP got better". She reports that her BP was 150/108 at home, so she called [facility]. She states that "the RN in [facility] told her that she would get admitted, if her BP was elevated. She denies visual disturbances, but she has been "feeling sleepy and a H/A".  Past Medical History:  Diagnosis Date  . Diabetes mellitus without complication (HCC)   . Hypertension     No past surgical history on file.  No family history on file.  Social History   Tobacco Use  . Smoking status: Never Smoker  . Smokeless tobacco: Never Used  Substance Use Topics  . Alcohol use: Yes    Comment: occassional  . Drug use: No    Allergies: No Known Allergies  Medications Prior to Admission  Medication Sig Dispense Refill Last Dose  . ACCU-CHEK FASTCLIX LANCETS MISC 1 Device by Percutaneous route 4 (four) times daily. 100 each 12 Taking  . acetaminophen (TYLENOL) 500 MG tablet Take 1,000 mg by mouth every 6 (six) hours as needed for mild pain.   Taking  . aspirin EC 81 MG tablet Take 1 tablet (81 mg total) by mouth daily. 60 tablet 2 Taking  . folic acid (FOLVITE) 1 MG tablet Take 1 tablet (1 mg total) by mouth daily. 60 tablet 3 Taking  . glucose blood test strip Use as instructed 100 each 12 Taking  . labetalol (NORMODYNE) 200 MG tablet Take 3 tablets (600 mg total) by mouth 3 (three) times daily. 60 tablet 3 Taking  . metFORMIN (GLUCOPHAGE) 1000 MG tablet Take  1 tablet (1,000 mg total) by mouth 2 (two) times daily with a meal.   Taking  . Prenatal Vit-Fe Fumarate-FA (MULTIVITAMIN-PRENATAL) 27-0.8 MG TABS tablet Take 1 tablet by mouth daily at 12 noon. 30 each 0 Taking    Review of Systems  Constitutional: Negative.   HENT: Negative.   Eyes: Negative.   Respiratory: Negative.   Cardiovascular: Negative.   Gastrointestinal: Negative.   Endocrine: Negative.   Genitourinary: Negative.   Musculoskeletal: Negative.   Skin: Negative.   Allergic/Immunologic: Negative.   Neurological: Positive for headaches.  Hematological: Negative.   Psychiatric/Behavioral: Negative.    Physical Exam   Blood pressure 126/84, pulse 82, temperature 98.3 F (36.8 C), resp. rate 18, weight 210 lb 12 oz (95.6 kg), last menstrual period 08/08/2017.  Physical Exam  Nursing note and vitals reviewed. Constitutional: She is oriented to person, place, and time. She appears well-developed and well-nourished.  HENT:  Head: Normocephalic and atraumatic.  Eyes: Pupils are equal, round, and reactive to light.  Neck: Normal range of motion.  Cardiovascular: Normal rate.  Respiratory: Effort normal.  Genitourinary:  Genitourinary Comments: Pelvic deferred  Musculoskeletal: Normal range of motion.  Neurological: She is alert and oriented to person, place, and time.  Skin: Skin is warm and dry.  Psychiatric: She has a  normal mood and affect. Her behavior is normal. Judgment and thought content normal.    MAU Course  Procedures  MDM BP check in triage FHTs by doppler: 155 bpm   Explained to patient that her BP in triage is within normal range. Patient upset that she is not being admitted. Advised patient to return to MAU or the nearest hospital for BP of 160/110, dizziness, or blurry vision Assessment and Plan  Chronic hypertension during pregnancy, antepartum  - Continue current HTN regimen - Information provided on HTN in pergnancy  - Advised patient to return  to MAU or the nearest hospital for BP of 160/110, dizziness, or blurry vision - Discharge patient - Keep scheduled appt with [facility] on 12/08/2017 - Patient verbalized an understanding of the plan of care and agrees.   Raelyn Moraolitta Evie Crumpler, MSN, CNM 11/24/2017, 6:15 PM

## 2017-11-25 ENCOUNTER — Other Ambulatory Visit: Payer: Medicaid Other

## 2017-11-26 ENCOUNTER — Other Ambulatory Visit: Payer: Self-pay | Admitting: *Deleted

## 2017-11-26 DIAGNOSIS — O0990 Supervision of high risk pregnancy, unspecified, unspecified trimester: Secondary | ICD-10-CM

## 2017-11-26 DIAGNOSIS — O10919 Unspecified pre-existing hypertension complicating pregnancy, unspecified trimester: Secondary | ICD-10-CM

## 2017-11-26 DIAGNOSIS — O24911 Unspecified diabetes mellitus in pregnancy, first trimester: Secondary | ICD-10-CM

## 2017-11-29 ENCOUNTER — Ambulatory Visit (INDEPENDENT_AMBULATORY_CARE_PROVIDER_SITE_OTHER): Payer: Medicaid Other | Admitting: Family Medicine

## 2017-11-29 ENCOUNTER — Other Ambulatory Visit: Payer: Medicaid Other

## 2017-11-29 VITALS — BP 125/89 | HR 88 | Wt 205.6 lb

## 2017-11-29 DIAGNOSIS — O10919 Unspecified pre-existing hypertension complicating pregnancy, unspecified trimester: Secondary | ICD-10-CM

## 2017-11-29 DIAGNOSIS — O0990 Supervision of high risk pregnancy, unspecified, unspecified trimester: Secondary | ICD-10-CM

## 2017-11-29 DIAGNOSIS — O0992 Supervision of high risk pregnancy, unspecified, second trimester: Secondary | ICD-10-CM

## 2017-11-29 DIAGNOSIS — O10912 Unspecified pre-existing hypertension complicating pregnancy, second trimester: Secondary | ICD-10-CM

## 2017-11-29 DIAGNOSIS — O24912 Unspecified diabetes mellitus in pregnancy, second trimester: Secondary | ICD-10-CM

## 2017-11-29 MED ORDER — INSULIN ASPART 100 UNIT/ML ~~LOC~~ SOLN: 16.0000 [IU] | Freq: Three times a day (TID) | SUBCUTANEOUS | 12 refills | Status: DC

## 2017-11-29 MED ORDER — INSULIN NPH (HUMAN) (ISOPHANE) 100 UNIT/ML ~~LOC~~ SUSP: 12.0000 [IU] | Freq: Two times a day (BID) | SUBCUTANEOUS | 3 refills | Status: DC

## 2017-11-29 MED ORDER — PRENATAL 27-0.8 MG PO TABS: 1.0000 | ORAL_TABLET | Freq: Every day | ORAL | 3 refills | Status: DC

## 2017-11-29 MED ORDER — "INSULIN SYRINGE 31G X 5/16"" 1 ML MISC": 1.0000 [IU] | 3 refills | Status: DC

## 2017-11-29 NOTE — Progress Notes (Signed)
   PRENATAL VISIT NOTE  Subjective:  Cheryl Curry is a 32 y.o. G3P2002 at 6057w6d being seen today for ongoing prenatal care.  She is currently monitored for the following issues for this high-risk pregnancy and has DM2 in pregnancy; Supervision of high risk pregnancy, antepartum; Medication exposure during first trimester of pregnancy; Chronic hypertension during pregnancy, antepartum; GBS bacteriuria; and Obesity in pregnancy on their problem list.  Patient reports no complaints.  Contractions: Not present. Vag. Bleeding: None.  Movement: Present. Denies leaking of fluid.   The following portions of the patient's history were reviewed and updated as appropriate: allergies, current medications, past family history, past medical history, past social history, past surgical history and problem list. Problem list updated.  Objective:   Vitals:   11/29/17 1056  BP: 125/89  Pulse: 88  Weight: 205 lb 9.6 oz (93.3 kg)    Fetal Status: Fetal Heart Rate (bpm): 155   Movement: Present     General:  Alert, oriented and cooperative. Patient is in no acute distress.  Skin: Skin is warm and dry. No rash noted.   Cardiovascular: Normal heart rate noted  Respiratory: Normal respiratory effort, no problems with respiration noted  Abdomen: Soft, gravid, appropriate for gestational age.  Pain/Pressure: Absent     Pelvic: Cervical exam deferred        Extremities: Normal range of motion.  Edema: None  Mental Status: Normal mood and affect. Normal behavior. Normal judgment and thought content.   Assessment and Plan:  Pregnancy: G3P2002 at 6857w6d  1. Supervision of high risk pregnancy, antepartum Repeat NIPT due to low fetal fraction - Genetic Screening - Prenatal Vit-Fe Fumarate-FA (MULTIVITAMIN-PRENATAL) 27-0.8 MG TABS tablet; Take 1 tablet by mouth daily at 12 noon.  Dispense: 180 each; Refill: 3  2. Chronic hypertension during pregnancy, antepartum BP is well controlled. Continue  Labetalol and ASA  3. Diabetes mellitus affecting pregnancy in second trimester No meter today Reports Fasting CBGs are in the 200 range 2 hour pp in 200s On metformin--needs insulin start--for teaching on Thursday. WEight based insulin and syringes sent in..bring with her to that appointment. - insulin NPH Human (HUMULIN N,NOVOLIN N) 100 UNIT/ML injection; Inject 0.12 mLs (12 Units total) into the skin 2 (two) times daily. 12 u q am and q hs  Dispense: 10 mL; Refill: 3 - insulin aspart (NOVOLOG) 100 UNIT/ML injection; Inject 16 Units into the skin 3 (three) times daily before meals.  Dispense: 10 mL; Refill: 12 - Insulin Syringe-Needle U-100 (INSULIN SYRINGE 1CC/31GX5/16") 31G X 5/16" 1 ML MISC; 1 Units by Does not apply route as directed.  Dispense: 100 each; Refill: 3  General obstetric precautions including but not limited to vaginal bleeding, contractions, leaking of fluid and fetal movement were reviewed in detail with the patient. Please refer to After Visit Summary for other counseling recommendations.  Return in 2 weeks (on 12/13/2017).  Future Appointments  Date Time Provider Department Center  12/02/2017  9:00 AM [facility]  12/15/2017  1:15 PM Reva BoresPratt, Adelaida Reindel S, MD [facility]  12/30/2017  9:30 AM [facility] US 1 [facility]    Reva Boresanya S Aviana Shevlin, MD

## 2017-11-29 NOTE — Patient Instructions (Signed)

## 2017-12-02 ENCOUNTER — Other Ambulatory Visit: Payer: Medicaid Other

## 2017-12-15 ENCOUNTER — Encounter: Payer: Self-pay | Admitting: *Deleted

## 2017-12-15 ENCOUNTER — Encounter: Payer: Medicaid Other | Admitting: Family Medicine

## 2017-12-15 ENCOUNTER — Telehealth: Payer: Self-pay | Admitting: Family Medicine

## 2017-12-15 NOTE — Telephone Encounter (Signed)
Patient calling to get test results.

## 2017-12-16 NOTE — Telephone Encounter (Signed)
Patient left message on nurse voicemail on 07/10//19at 1245.  Patient requesting results from test.    Phoned patient back.  Explained with active My Chart account and she can get results there.  Have since my conversation with the patient I have found that my chart is no longer allowing access to the scanned lab results.  This particular test is scanned to the patient's record.  Will forward patient's results to her My Chart so she will see them.

## 2017-12-23 ENCOUNTER — Encounter (HOSPITAL_COMMUNITY): Payer: Self-pay

## 2017-12-30 ENCOUNTER — Encounter (HOSPITAL_COMMUNITY): Payer: Self-pay

## 2017-12-30 ENCOUNTER — Ambulatory Visit (HOSPITAL_COMMUNITY)
Admission: RE | Admit: 2017-12-30 | Discharge: 2017-12-30 | Disposition: A | Discharge status: Home / Self Care | Payer: Medicaid Other | Source: Ambulatory Visit | Attending: Obstetrics and Gynecology | Admitting: Obstetrics and Gynecology

## 2017-12-30 ENCOUNTER — Other Ambulatory Visit: Payer: Self-pay | Admitting: Obstetrics and Gynecology

## 2017-12-30 ENCOUNTER — Encounter: Payer: Medicaid Other | Admitting: Obstetrics & Gynecology

## 2017-12-30 ENCOUNTER — Other Ambulatory Visit (HOSPITAL_COMMUNITY): Payer: Self-pay | Admitting: *Deleted

## 2017-12-30 DIAGNOSIS — O99212 Obesity complicating pregnancy, second trimester: Secondary | ICD-10-CM | POA: Diagnosis not present

## 2017-12-30 DIAGNOSIS — Z794 Long term (current) use of insulin: Secondary | ICD-10-CM | POA: Insufficient documentation

## 2017-12-30 DIAGNOSIS — O10019 Pre-existing essential hypertension complicating pregnancy, unspecified trimester: Secondary | ICD-10-CM

## 2017-12-30 DIAGNOSIS — O24119 Pre-existing diabetes mellitus, type 2, in pregnancy, unspecified trimester: Secondary | ICD-10-CM

## 2017-12-30 DIAGNOSIS — Z363 Encounter for antenatal screening for malformations: Secondary | ICD-10-CM | POA: Diagnosis not present

## 2017-12-30 DIAGNOSIS — Z3A19 19 weeks gestation of pregnancy: Secondary | ICD-10-CM | POA: Diagnosis not present

## 2017-12-30 DIAGNOSIS — Z79899 Other long term (current) drug therapy: Secondary | ICD-10-CM | POA: Diagnosis not present

## 2017-12-30 DIAGNOSIS — Z3689 Encounter for other specified antenatal screening: Secondary | ICD-10-CM | POA: Diagnosis not present

## 2017-12-30 DIAGNOSIS — O24112 Pre-existing diabetes mellitus, type 2, in pregnancy, second trimester: Secondary | ICD-10-CM

## 2017-12-30 DIAGNOSIS — O0990 Supervision of high risk pregnancy, unspecified, unspecified trimester: Secondary | ICD-10-CM

## 2017-12-30 DIAGNOSIS — O10012 Pre-existing essential hypertension complicating pregnancy, second trimester: Secondary | ICD-10-CM | POA: Diagnosis not present

## 2017-12-30 DIAGNOSIS — E669 Obesity, unspecified: Secondary | ICD-10-CM | POA: Diagnosis not present

## 2017-12-30 DIAGNOSIS — Z7984 Long term (current) use of oral hypoglycemic drugs: Principal | ICD-10-CM

## 2018-01-03 ENCOUNTER — Ambulatory Visit: Payer: Medicaid Other

## 2018-01-03 LAB — POCT URINALYSIS DIP (DEVICE)
BILIRUBIN URINE: NEGATIVE
Glucose, UA: 500 mg/dL — AB
Hgb urine dipstick: NEGATIVE
Leukocytes, UA: NEGATIVE
Nitrite: NEGATIVE
PH: 7 (ref 5.0–8.0)
Protein, ur: NEGATIVE mg/dL
Specific Gravity, Urine: 1.015 (ref 1.005–1.030)
Urobilinogen, UA: 0.2 mg/dL (ref 0.0–1.0)

## 2018-01-13 ENCOUNTER — Ambulatory Visit (INDEPENDENT_AMBULATORY_CARE_PROVIDER_SITE_OTHER): Payer: Medicaid Other | Admitting: Family Medicine

## 2018-01-13 VITALS — BP 152/100 | HR 94 | Wt 213.8 lb

## 2018-01-13 DIAGNOSIS — O10919 Unspecified pre-existing hypertension complicating pregnancy, unspecified trimester: Secondary | ICD-10-CM

## 2018-01-13 DIAGNOSIS — O99210 Obesity complicating pregnancy, unspecified trimester: Secondary | ICD-10-CM

## 2018-01-13 DIAGNOSIS — O24912 Unspecified diabetes mellitus in pregnancy, second trimester: Secondary | ICD-10-CM

## 2018-01-13 DIAGNOSIS — O0990 Supervision of high risk pregnancy, unspecified, unspecified trimester: Secondary | ICD-10-CM

## 2018-01-13 DIAGNOSIS — R8271 Bacteriuria: Secondary | ICD-10-CM

## 2018-01-13 LAB — POCT URINALYSIS DIP (DEVICE)
BILIRUBIN URINE: NEGATIVE
GLUCOSE, UA: 500 mg/dL — AB
Hgb urine dipstick: NEGATIVE
Ketones, ur: NEGATIVE mg/dL
LEUKOCYTES UA: NEGATIVE
NITRITE: POSITIVE — AB
Protein, ur: NEGATIVE mg/dL
Specific Gravity, Urine: 1.01 (ref 1.005–1.030)
UROBILINOGEN UA: 0.2 mg/dL (ref 0.0–1.0)
pH: 6.5 (ref 5.0–8.0)

## 2018-01-13 NOTE — Progress Notes (Signed)
Subjective:  Cheryl Curry is a 32 y.o. G3P2002 at 334w1d being seen today for ongoing prenatal care.  She is currently monitored for the following issues for this high-risk pregnancy and has DM2 in pregnancy; Supervision of high risk pregnancy, antepartum; Medication exposure during first trimester of pregnancy; Chronic hypertension during pregnancy, antepartum; GBS bacteriuria; and Obesity in pregnancy on their problem list.  GDM: Patient taking Insulin NPH 12u BID, Aspart16u ac.  Reports no hypoglycemic episodes.  Tolerating medication well. Did not bring log book or meter. Reports as following: Fasting: 124 or so 2hr PP: 200  Ran out of BP medication.  Patient reports no complaints.  Contractions: Not present. Vag. Bleeding: None.  Movement: Present. Denies leaking of fluid.   The following portions of the patient's history were reviewed and updated as appropriate: allergies, current medications, past family history, past medical history, past social history, past surgical history and problem list. Problem list updated.  Objective:   Vitals:   01/13/18 1430 01/13/18 1436  BP: (!) 159/102 (!) 152/100  Pulse: 94   Weight: 213 lb 12.8 oz (97 kg)     Fetal Status: Fetal Heart Rate (bpm): 153   Movement: Present     General:  Alert, oriented and cooperative. Patient is in no acute distress.  Skin: Skin is warm and dry. No rash noted.   Cardiovascular: Normal heart rate noted  Respiratory: Normal respiratory effort, no problems with respiration noted  Abdomen: Soft, gravid, appropriate for gestational age. Pain/Pressure: Present     Pelvic: Vag. Bleeding: None     Cervical exam deferred        Extremities: Normal range of motion.  Edema: Trace  Mental Status: Normal mood and affect. Normal behavior. Normal judgment and thought content.   Urinalysis:      Assessment and Plan:  Pregnancy: G3P2002 at [redacted]w[redacted]d  1. Supervision of high risk pregnancy, antepartum FHT  AFP  today  2. Chronic hypertension during pregnancy, antepartum Refill medication  4. Diabetes mellitus affecting pregnancy in second trimester Increase NPH to 17units BID Continue aspart 16 units ac   5. GBS bacteriuria Intrapartum prophylaxis  6. Obesity in pregnancy   Preterm labor symptoms and general obstetric precautions including but not limited to vaginal bleeding, contractions, leaking of fluid and fetal movement were reviewed in detail with the patient. Please refer to After Visit Summary for other counseling recommendations.  No follow-ups on file.   Levie HeritageStinson, Maize Brittingham J, DO

## 2018-01-13 NOTE — Progress Notes (Signed)
C/o edema in right hand only.  States hasn't been taking medicine for about a week because she ran out and doesn't have the money yet.

## 2018-01-13 NOTE — Addendum Note (Signed)
Addended by: Gerome ApleyZEYFANG, Lorrine Killilea L on: 01/13/2018 03:05 PM   Modules accepted: Orders

## 2018-01-14 ENCOUNTER — Telehealth: Payer: Self-pay | Admitting: *Deleted

## 2018-01-15 LAB — AFP, SERUM, OPEN SPINA BIFIDA
AFP MoM: 0.7
AFP VALUE AFPOSL: 39.8 ng/mL
Gest. Age on Collection Date: 21.1 weeks
MATERNAL AGE AT EDD: 33.8 a
OSBR Risk 1 IN: 10000
Test Results:: NEGATIVE
Weight: 213 [lb_av]

## 2018-01-17 NOTE — Telephone Encounter (Signed)
Opened in error

## 2018-01-20 ENCOUNTER — Telehealth: Payer: Self-pay | Admitting: General Practice

## 2018-01-20 NOTE — Telephone Encounter (Signed)
Patient called & left message requesting results.

## 2018-01-24 NOTE — Telephone Encounter (Signed)
Good Morning,  I am Marley Charlot, Charity fundraiserN from [facility]. I am help covering the nurse line from [facility]. What test results are you requesting?

## 2018-01-25 NOTE — Telephone Encounter (Signed)
Called patient, no answer- left message stating we are trying to reach you to return your phone call. Your recent results in our office are normal. You may call us back if you have other questions.

## 2018-01-27 ENCOUNTER — Ambulatory Visit (HOSPITAL_COMMUNITY)
Admission: RE | Admit: 2018-01-27 | Discharge: 2018-01-27 | Disposition: A | Discharge status: Home / Self Care | Payer: Medicaid Other | Source: Ambulatory Visit | Attending: Obstetrics & Gynecology | Admitting: Obstetrics & Gynecology

## 2018-01-28 ENCOUNTER — Telehealth: Payer: Self-pay

## 2018-01-28 ENCOUNTER — Ambulatory Visit (INDEPENDENT_AMBULATORY_CARE_PROVIDER_SITE_OTHER): Payer: Medicaid Other | Admitting: Clinical

## 2018-01-28 ENCOUNTER — Ambulatory Visit (INDEPENDENT_AMBULATORY_CARE_PROVIDER_SITE_OTHER): Payer: Medicaid Other | Admitting: Obstetrics & Gynecology

## 2018-01-28 VITALS — BP 145/98 | HR 92 | Wt 211.0 lb

## 2018-01-28 DIAGNOSIS — R300 Dysuria: Secondary | ICD-10-CM

## 2018-01-28 DIAGNOSIS — O10919 Unspecified pre-existing hypertension complicating pregnancy, unspecified trimester: Secondary | ICD-10-CM

## 2018-01-28 DIAGNOSIS — O24912 Unspecified diabetes mellitus in pregnancy, second trimester: Secondary | ICD-10-CM

## 2018-01-28 DIAGNOSIS — O0990 Supervision of high risk pregnancy, unspecified, unspecified trimester: Secondary | ICD-10-CM

## 2018-01-28 DIAGNOSIS — O99210 Obesity complicating pregnancy, unspecified trimester: Secondary | ICD-10-CM

## 2018-01-28 DIAGNOSIS — F4323 Adjustment disorder with mixed anxiety and depressed mood: Secondary | ICD-10-CM

## 2018-01-28 DIAGNOSIS — R8271 Bacteriuria: Secondary | ICD-10-CM

## 2018-01-28 LAB — POCT URINALYSIS DIP (DEVICE)
BILIRUBIN URINE: NEGATIVE
GLUCOSE, UA: 500 mg/dL — AB
Hgb urine dipstick: NEGATIVE
Ketones, ur: 40 mg/dL — AB
NITRITE: POSITIVE — AB
Protein, ur: 30 mg/dL — AB
Specific Gravity, Urine: 1.025 (ref 1.005–1.030)
UROBILINOGEN UA: 0.2 mg/dL (ref 0.0–1.0)
pH: 6.5 (ref 5.0–8.0)

## 2018-01-28 LAB — HEMOGLOBIN A1C
ESTIMATED AVERAGE GLUCOSE: 197 mg/dL
HEMOGLOBIN A1C: 8.5 % — AB (ref 4.8–5.6)

## 2018-01-28 MED ORDER — NITROFURANTOIN MONOHYD MACRO 100 MG PO CAPS: 100.0000 mg | ORAL_CAPSULE | Freq: Two times a day (BID) | ORAL | 1 refills | Status: DC

## 2018-01-28 MED ORDER — FLUCONAZOLE 150 MG PO TABS: 150.0000 mg | ORAL_TABLET | Freq: Once | ORAL | 3 refills | Status: AC

## 2018-01-28 NOTE — Telephone Encounter (Signed)
Called pt to advise of Fetal Echo appt Dr. Elizebeth Brookingotton on 02/03/18 at 9am. No answer, left VM.

## 2018-01-28 NOTE — BH Specialist Note (Signed)
Integrated Behavioral Health Initial Visit  MRN: [id number] Name: Cheryl Curry  Number of Integrated Behavioral Health Clinician visits:: 1/6 Session Start time: 10:33  Session End time: 10:56 Total time: 20 minutes  Type of Service: Integrated Behavioral Health- Individual/Family Interpretor:No. Interpretor Name and Language: n/a   Warm Hand Off Completed.       SUBJECTIVE: Cheryl Curry is a 32 y.o. female accompanied by n/a Patient was referred by Leroy LibmanKelly Davis, MD for depression. Patient reports the following symptoms/concerns: Pt states her primary concern today is fluctuating emotions, irritability, and thoughts of "not wanting to be here anymore" 2 days ago, attributed to financial limitations in obtaining medication, and relationship difficulty with MIL.  Pt was attending [facility] for therapy and BH medication management, but did not make last appointment a few months ago.  Duration of problem: Current pregnancy; Severity of problem: moderate  OBJECTIVE: Mood: Irritable and Affect: Appropriate Risk of harm to self or others: Suicidal ideation No plan to harm self or others  LIFE CONTEXT: Family and Social: Pt lives with her husband and two children(11yo;15yo); does not get along with her MIL School/Work: Working part time hours Self-Care: - Life Changes: Current pregnancy  GOALS ADDRESSED: Patient will: 1. Reduce symptoms of: agitation, anxiety, depression and stress 2. Increase knowledge and/or ability of: healthy habits  3. Demonstrate ability to: Improve medication compliance  INTERVENTIONS: Interventions utilized: Motivational Interviewing and Psychoeducation and/or Health Education  Standardized Assessments completed: C-SSRS Short, GAD-7 and PHQ 9  ASSESSMENT: Patient currently experiencing Adjustment disorder with mixed anxious and depressed mood   Patient may benefit from psychoeducation and brief therapeutic  interventions regarding coping with symptoms of depression and anxiety .  PLAN: 1. Follow up with behavioral health clinician on : One month 2. Behavioral recommendations:  -Follow Safety Plan -Call [facility] to schedule next appointment  -Read educational materials regarding coping with symptoms of depression and anxiety  3. Referral(s): Integrated [facility] (In Clinic) and [facility] ([facility]Outside Clinic) 4. "From scale of 1-10, how likely are you to follow plan?": -  Rae LipsJamie C Anaiah Mcmannis, LCSW  Depression screen [facility] 2/9 01/28/2018 01/13/2018 11/29/2017 11/29/2017 11/15/2017  Decreased Interest 1 0 1 1 1   Down, Depressed, Hopeless 3 2 2 2 1   PHQ - 2 Score 4 2 3 3 2   Altered sleeping 2 0 0 0 1  Tired, decreased energy 1 1 1 1 2   Change in appetite 1 1 1 1 1   Feeling bad or failure about yourself  0 0 1 1 0  Trouble concentrating 0 0 0 0 0  Moving slowly or fidgety/restless 0 0 0 0 0  Suicidal thoughts 1 2 0 0 0  PHQ-9 Score 9 6 6 6 6    GAD 7 : Generalized Anxiety Score 01/28/2018 01/13/2018 11/29/2017 11/29/2017  Nervous, Anxious, on Edge 1 1 0 0  Control/stop worrying 2 2 0 0  Worry too much - different things 2 2 2 2   Trouble relaxing 0 0 0 0  Restless 0 1 0 0  Easily annoyed or irritable 3 3 2 2   Afraid - awful might happen 2 0 0 0  Total GAD 7 Score 10 9 4  4

## 2018-01-28 NOTE — Progress Notes (Signed)
   PRENATAL VISIT NOTE  Subjective:  Cheryl Curry is a 32 y.o. G3P2002 at 7965w2d being seen today for ongoing prenatal care.  She is currently monitored for the following issues for this high-risk pregnancy and has DM2 in pregnancy; Supervision of high risk pregnancy, antepartum; Medication exposure during first trimester of pregnancy; Chronic hypertension during pregnancy, antepartum; GBS bacteriuria; and Obesity in pregnancy on their problem list.  Patient reports no complaints.  Contractions: Not present. Vag. Bleeding: None.  Movement: Present. Denies leaking of fluid.   The following portions of the patient's history were reviewed and updated as appropriate: allergies, current medications, past family history, past medical history, past social history, past surgical history and problem list. Problem list updated.  Objective:   Vitals:   01/28/18 1005  BP: (!) 145/98  Pulse: 92  Weight: 211 lb (95.7 kg)    Fetal Status: Fetal Heart Rate (bpm): 151   Movement: Present     General:  Alert, oriented and cooperative. Patient is in no acute distress.  Skin: Skin is warm and dry. No rash noted.   Cardiovascular: Normal heart rate noted  Respiratory: Normal respiratory effort, no problems with respiration noted  Abdomen: Soft, gravid, appropriate for gestational age.  Pain/Pressure: Absent     vulva Discharge c/w yeast  Extremities: Normal range of motion.  Edema: Trace  Mental Status: Normal mood and affect. Normal behavior. Normal judgment and thought content.   Assessment and Plan:  Pregnancy: G3P2002 at 6265w2d  1. Chronic hypertension during pregnancy, antepartum - not taking her meds - denies symptoms of pre eclampsia as of yet - MFM u/s on 02-01-18  2. Diabetes mellitus affecting pregnancy in second trimester - She did not bring sugars, is not taking her meds due to cost -Asher MuirJamie has been notified as well as the diabetes coordinator from the health dept to see if  they have any suggestions for her  - rec fetal echo  - Hemoglobin A1c  - treat yeast vaginitis with diflucan  3. Supervision of high risk pregnancy, antepartum   4. Obesity in pregnancy   5. GBS bacteriuria - Treat in labor  6. Dysuria -- Treat with macrobid - Culture, OB Urine  Preterm labor symptoms and general obstetric precautions including but not limited to vaginal bleeding, contractions, leaking of fluid and fetal movement were reviewed in detail with the patient. Please refer to After Visit Summary for other counseling recommendations.  No follow-ups on file.  Future Appointments  Date Time Provider Department Center  01/28/2018 11:40 [facility]  02/01/2018  2:30 PM [facility] US 5 [facility]    Allie BossierMyra C Arrion Burruel, MD

## 2018-01-28 NOTE — Progress Notes (Signed)
Patient reports vaginal irritation

## 2018-01-31 ENCOUNTER — Other Ambulatory Visit: Payer: Self-pay | Admitting: Obstetrics & Gynecology

## 2018-01-31 LAB — URINE CULTURE, OB REFLEX

## 2018-01-31 LAB — CULTURE, OB URINE

## 2018-01-31 MED ORDER — NITROFURANTOIN MONOHYD MACRO 100 MG PO CAPS: 100.0000 mg | ORAL_CAPSULE | Freq: Two times a day (BID) | ORAL | 1 refills | Status: DC

## 2018-02-01 ENCOUNTER — Ambulatory Visit (HOSPITAL_COMMUNITY)
Admission: RE | Admit: 2018-02-01 | Discharge: 2018-02-01 | Disposition: A | Discharge status: Home / Self Care | Payer: Medicaid Other | Source: Ambulatory Visit | Attending: Obstetrics & Gynecology | Admitting: Obstetrics & Gynecology

## 2018-02-01 ENCOUNTER — Encounter (HOSPITAL_COMMUNITY): Payer: Self-pay

## 2018-02-01 ENCOUNTER — Other Ambulatory Visit (HOSPITAL_COMMUNITY): Payer: Self-pay | Admitting: *Deleted

## 2018-02-01 DIAGNOSIS — O99212 Obesity complicating pregnancy, second trimester: Secondary | ICD-10-CM | POA: Insufficient documentation

## 2018-02-01 DIAGNOSIS — Z3A23 23 weeks gestation of pregnancy: Secondary | ICD-10-CM | POA: Insufficient documentation

## 2018-02-01 DIAGNOSIS — O0990 Supervision of high risk pregnancy, unspecified, unspecified trimester: Secondary | ICD-10-CM

## 2018-02-01 DIAGNOSIS — O10012 Pre-existing essential hypertension complicating pregnancy, second trimester: Secondary | ICD-10-CM | POA: Diagnosis not present

## 2018-02-01 DIAGNOSIS — O24112 Pre-existing diabetes mellitus, type 2, in pregnancy, second trimester: Secondary | ICD-10-CM | POA: Diagnosis present

## 2018-02-01 DIAGNOSIS — E669 Obesity, unspecified: Secondary | ICD-10-CM | POA: Insufficient documentation

## 2018-02-01 DIAGNOSIS — O24119 Pre-existing diabetes mellitus, type 2, in pregnancy, unspecified trimester: Secondary | ICD-10-CM

## 2018-02-01 DIAGNOSIS — Z7984 Long term (current) use of oral hypoglycemic drugs: Secondary | ICD-10-CM

## 2018-02-01 DIAGNOSIS — Z362 Encounter for other antenatal screening follow-up: Secondary | ICD-10-CM | POA: Diagnosis present

## 2018-02-01 DIAGNOSIS — O99210 Obesity complicating pregnancy, unspecified trimester: Secondary | ICD-10-CM

## 2018-02-01 DIAGNOSIS — O10919 Unspecified pre-existing hypertension complicating pregnancy, unspecified trimester: Secondary | ICD-10-CM

## 2018-02-08 ENCOUNTER — Encounter: Payer: Self-pay | Admitting: Obstetrics and Gynecology

## 2018-02-11 ENCOUNTER — Encounter: Payer: Self-pay | Admitting: *Deleted

## 2018-02-17 ENCOUNTER — Ambulatory Visit (INDEPENDENT_AMBULATORY_CARE_PROVIDER_SITE_OTHER): Payer: Medicaid Other | Admitting: Obstetrics & Gynecology

## 2018-02-17 ENCOUNTER — Encounter: Payer: Self-pay | Admitting: Obstetrics & Gynecology

## 2018-02-17 VITALS — BP 125/82 | HR 84 | Wt 217.4 lb

## 2018-02-17 DIAGNOSIS — O99210 Obesity complicating pregnancy, unspecified trimester: Secondary | ICD-10-CM

## 2018-02-17 DIAGNOSIS — O24912 Unspecified diabetes mellitus in pregnancy, second trimester: Secondary | ICD-10-CM

## 2018-02-17 DIAGNOSIS — O10919 Unspecified pre-existing hypertension complicating pregnancy, unspecified trimester: Secondary | ICD-10-CM

## 2018-02-17 DIAGNOSIS — O24419 Gestational diabetes mellitus in pregnancy, unspecified control: Secondary | ICD-10-CM

## 2018-02-17 DIAGNOSIS — O0990 Supervision of high risk pregnancy, unspecified, unspecified trimester: Secondary | ICD-10-CM

## 2018-02-17 LAB — POCT URINALYSIS DIP (DEVICE)
Bilirubin Urine: NEGATIVE
GLUCOSE, UA: 250 mg/dL — AB
Hgb urine dipstick: NEGATIVE
Leukocytes, UA: NEGATIVE
Nitrite: NEGATIVE
Protein, ur: NEGATIVE mg/dL
SPECIFIC GRAVITY, URINE: 1.02 (ref 1.005–1.030)
UROBILINOGEN UA: 0.2 mg/dL (ref 0.0–1.0)
pH: 7 (ref 5.0–8.0)

## 2018-02-17 MED ORDER — INSULIN NPH (HUMAN) (ISOPHANE) 100 UNIT/ML ~~LOC~~ SUSP: 20.0000 [IU] | Freq: Two times a day (BID) | SUBCUTANEOUS | 3 refills | Status: DC

## 2018-02-17 MED ORDER — INSULIN ASPART 100 UNIT/ML ~~LOC~~ SOLN: 18.0000 [IU] | Freq: Three times a day (TID) | SUBCUTANEOUS | 12 refills | Status: DC

## 2018-02-17 NOTE — Progress Notes (Signed)
Declines flu shot

## 2018-02-17 NOTE — Progress Notes (Signed)
   PRENATAL VISIT NOTE  Subjective:  Cheryl Curry is a 32 y.o. G3P2002 at 8165w1d being seen today for ongoing prenatal care.  She is currently monitored for the following issues for this high-risk pregnancy and has DM2 in pregnancy; Supervision of high risk pregnancy, antepartum; Medication exposure during first trimester of pregnancy; Chronic hypertension during pregnancy, antepartum; GBS bacteriuria; and Obesity in pregnancy on their problem list.  Patient reports backache.  Contractions: Irregular. Vag. Bleeding: None.  Movement: Present. Denies leaking of fluid.   The following portions of the patient's history were reviewed and updated as appropriate: allergies, current medications, past family history, past medical history, past social history, past surgical history and problem list. Problem list updated.  Objective:   Vitals:   02/17/18 1503  BP: 125/82  Pulse: 84  Weight: 217 lb 6.4 oz (98.6 kg)    Fetal Status: Fetal Heart Rate (bpm): 158 Fundal Height: 28 cm Movement: Present     General:  Alert, oriented and cooperative. Patient is in no acute distress.  Skin: Skin is warm and dry. No rash noted.   Cardiovascular: Normal heart rate noted  Respiratory: Normal respiratory effort, no problems with respiration noted  Abdomen: Soft, gravid, appropriate for gestational age.  Pain/Pressure: Present     Pelvic: Cervical exam deferred        Extremities: Normal range of motion.  Edema: Trace  Mental Status: Normal mood and affect. Normal behavior. Normal judgment and thought content.   Assessment and Plan:  Pregnancy: G3P2002 at 9065w1d  1. Gestational diabetes mellitus (GDM) affecting pregnancy, antepartum Needs to test as recommended QID, has elevated A1C and valules all 170-250's, uncontrolled  2. Supervision of high risk pregnancy, antepartum   3. Chronic hypertension during pregnancy, antepartum Normal BP  4. Obesity in pregnancy At risk   5. Diabetes  mellitus affecting pregnancy in second trimester Change insulin doses - insulin NPH Human (HUMULIN N,NOVOLIN N) 100 UNIT/ML injection; Inject 0.2 mLs (20 Units total) into the skin 2 (two) times daily. 20 u q am and q hs  Dispense: 10 mL; Refill: 3 - insulin aspart (NOVOLOG) 100 UNIT/ML injection; Inject 18 Units into the skin 3 (three) times daily before meals.  Dispense: 10 mL; Refill: 12  Preterm labor symptoms and general obstetric precautions including but not limited to vaginal bleeding, contractions, leaking of fluid and fetal movement were reviewed in detail with the patient. Please refer to After Visit Summary for other counseling recommendations.  Return in about 1 week (around 02/24/2018).  Future Appointments  Date Time Provider Department Center  03/01/2018  8:15 AM [facility] US 4 [facility]    Scheryl DarterJames Annalucia Laino, MD

## 2018-02-17 NOTE — Patient Instructions (Addendum)
Type 1 or Type 2 Diabetes Mellitus During Pregnancy, Diagnosis Type 1 diabetes (type 1 diabetes mellitus) and type 2 diabetes (type 2 diabetes mellitus) are long-term (chronic) diseases. Your diabetes may be caused by one or both of these problems:  Your body does not make enough of a hormone called insulin.  Your body does not respond in a normal way to insulin that it makes.  Insulin lets sugars (glucose) go into cells in the body. This gives you energy. If you have diabetes, sugars cannot get into cells. This causes high blood sugar (hyperglycemia). If diabetes is treated, it may not hurt you or your baby. Your doctor will set treatment goals for you. In general, you should have these blood sugar levels:  After not eating for a long time (fasting): 95 mg/dL (5.3 mmol/L).  After meals (postprandial): ? One hour after a meal: at or below 140 mg/dL (7.8 mmol/L). ? Two hours after a meal: at or below 120 mg/dL (6.7 mmol/L).  A1c (hemoglobin A1c) level: 6-6.5%.  Follow these instructions at home: Questions to Ask Your Doctor  You may want to ask these questions:  Do I need to meet with a diabetes educator?  Where can I find a support group for people with diabetes?  What equipment will I need to care for myself at home?  What diabetes medicines do I need? When should I take them?  How often do I need to check my blood sugar?  What number can I call if I have questions?  When is my next doctor's visit?  General instructions  Take over-the-counter and prescription medicines only as told by your doctor.  Stay at a healthy weight during pregnancy.  Keep all follow-up visits as told by your doctor. This is important. Contact a doctor if:  Your blood sugar is at or above 240 mg/dL (16.1 mmol/L).  Your blood sugar is at or above 200 mg/dL (09.6 mmol/L), and you have ketones in your pee (urine).  You have been sick or have had a fever for 2 days or more, and you are not  getting better.  You have any of these problems for more than 6 hours: ? You cannot eat or drink. ? You feel sick to your stomach (nauseous). ? You throw up (vomit). ? You have watery poop (diarrhea). Get help right away if:  Your blood sugar is lower than 54 mg/dL (3 mmol/L).  You get confused.  You have trouble: ? Thinking clearly. ? Breathing.  Your baby moves less than normal.  You have: ? Moderate or large ketone levels in your pee (urine). ? Bleeding from your vagina. ? Unusual fluid coming from your vagina. ? Early contractions. These may feel like tightness in your belly. This information is not intended to replace advice given to you by your health care provider. Make sure you discuss any questions you have with your health care provider. Document Released: 09/16/2015 Document Revised: 04/08/2016 Document Reviewed: 06/28/2015 Elsevier Interactive Patient Education  2017 Elsevier Inc.  Back Pain in Pregnancy Back pain during pregnancy is common. Back pain may be caused by several factors that are related to changes during your pregnancy. Follow these instructions at home: Managing pain, stiffness, and swelling  If directed, apply ice for sudden (acute) back pain. ? Put ice in a plastic bag. ? Place a towel between your skin and the bag. ? Leave the ice on for 20 minutes, 2-3 times per day.  If directed, apply heat to  the affected area before you exercise: ? Place a towel between your skin and the heat pack or heating pad. ? Leave the heat on for 20-30 minutes. ? Remove the heat if your skin turns bright red. This is especially important if you are unable to feel pain, heat, or cold. You may have a greater risk of getting burned. Activity  Exercise as told by your health care provider. Exercising is the best way to prevent or manage back pain.  Listen to your body when lifting. If lifting hurts, ask for help or bend your knees. This uses your leg muscles instead  of your back muscles.  Squat down when picking up something from the floor. Do not bend over.  Only use bed rest as told by your health care provider. Bed rest should only be used for the most severe episodes of back pain. Standing, Sitting, and Lying Down  Do not stand in one place for long periods of time.  Use good posture when sitting. Make sure your head rests over your shoulders and is not hanging forward. Use a pillow on your lower back if necessary.  Try sleeping on your side, preferably the left side, with a pillow or two between your legs. If you are sore after a night's rest, your bed may be too soft. A firm mattress may provide more support for your back during pregnancy. General instructions  Do not wear high heels.  Eat a healthy diet. Try to gain weight within your health care provider's recommendations.  Use a maternity girdle, elastic sling, or back brace as told by your health care provider.  Take over-the-counter and prescription medicines only as told by your health care provider.  Keep all follow-up visits as told by your health care provider. This is important. This includes any visits with any specialists, such as a physical therapist. Contact a health care provider if:  Your back pain interferes with your daily activities.  You have increasing pain in other parts of your body. Get help right away if:  You develop numbness, tingling, weakness, or problems with the use of your arms or legs.  You develop severe back pain that is not controlled with medicine.  You have a sudden change in bowel or bladder control.  You develop shortness of breath, dizziness, or you faint.  You develop nausea, vomiting, or sweating.  You have back pain that is a rhythmic, cramping pain similar to labor pains. Labor pain is usually 1-2 minutes apart, lasts for about 1 minute, and involves a bearing down feeling or pressure in your pelvis.  You have back pain and your water  breaks or you have vaginal bleeding.  You have back pain or numbness that travels down your leg.  Your back pain developed after you fell.  You develop pain on one side of your back.  You see blood in your urine.  You develop skin blisters in the area of your back pain. This information is not intended to replace advice given to you by your health care provider. Make sure you discuss any questions you have with your health care provider. Document Released: 09/02/2005 Document Revised: 10/31/2015 Document Reviewed: 02/06/2015 Elsevier Interactive Patient Education  [employer]2018 Else[employer] Inc.

## 2018-02-28 ENCOUNTER — Ambulatory Visit (INDEPENDENT_AMBULATORY_CARE_PROVIDER_SITE_OTHER): Payer: Medicaid Other | Admitting: Family Medicine

## 2018-02-28 VITALS — BP 118/80 | HR 97 | Wt 217.0 lb

## 2018-02-28 DIAGNOSIS — O99210 Obesity complicating pregnancy, unspecified trimester: Secondary | ICD-10-CM

## 2018-02-28 DIAGNOSIS — O10919 Unspecified pre-existing hypertension complicating pregnancy, unspecified trimester: Secondary | ICD-10-CM

## 2018-02-28 DIAGNOSIS — O0990 Supervision of high risk pregnancy, unspecified, unspecified trimester: Secondary | ICD-10-CM

## 2018-02-28 DIAGNOSIS — O24912 Unspecified diabetes mellitus in pregnancy, second trimester: Secondary | ICD-10-CM

## 2018-02-28 DIAGNOSIS — R8271 Bacteriuria: Secondary | ICD-10-CM

## 2018-02-28 LAB — POCT URINALYSIS DIP (DEVICE)
Bilirubin Urine: NEGATIVE
GLUCOSE, UA: 250 mg/dL — AB
Hgb urine dipstick: NEGATIVE
Leukocytes, UA: NEGATIVE
NITRITE: NEGATIVE
PROTEIN: 30 mg/dL — AB
Specific Gravity, Urine: >=1.03 (ref 1.005–1.030)
UROBILINOGEN UA: 0.2 mg/dL (ref 0.0–1.0)
pH: 6.5 (ref 5.0–8.0)

## 2018-02-28 MED ORDER — INSULIN NPH (HUMAN) (ISOPHANE) 100 UNIT/ML ~~LOC~~ SUSP: 30.0000 [IU] | Freq: Two times a day (BID) | SUBCUTANEOUS | 3 refills | Status: DC

## 2018-02-28 NOTE — Progress Notes (Signed)
Subjective:  Cheryl Curry is a 32 y.o. G3P2002 at 2931w5d being seen today for ongoing prenatal care.  She is currently monitored for the following issues for this high-risk pregnancy and has DM2 in pregnancy; Supervision of high risk pregnancy, antepartum; Medication exposure during first trimester of pregnancy; Chronic hypertension during pregnancy, antepartum; GBS bacteriuria; and Obesity in pregnancy on their problem list.  GDM: Patient taking NPH 20/20, Aspart 18/18/18.  Reports no hypoglycemic episodes.  Tolerating medication well. Not checking every day.  Fasting: All elevated > 130 2hr PP: all elevated >180  Patient reports no complaints.  Contractions: Irregular. Vag. Bleeding: None.  Movement: Present. Denies leaking of fluid.   The following portions of the patient's history were reviewed and updated as appropriate: allergies, current medications, past family history, past medical history, past social history, past surgical history and problem list. Problem list updated.  Objective:   Vitals:   02/28/18 1542  BP: 118/80  Pulse: 97  Weight: 217 lb (98.4 kg)    Fetal Status: Fetal Heart Rate (bpm): 150   Movement: Present     General:  Alert, oriented and cooperative. Patient is in no acute distress.  Skin: Skin is warm and dry. No rash noted.   Cardiovascular: Normal heart rate noted  Respiratory: Normal respiratory effort, no problems with respiration noted  Abdomen: Soft, gravid, appropriate for gestational age. Pain/Pressure: Present     Pelvic: Vag. Bleeding: None Vag D/C Character: White   Cervical exam deferred        Extremities: Normal range of motion.  Edema: None  Mental Status: Normal mood and affect. Normal behavior. Normal judgment and thought content.   Urinalysis:      Assessment and Plan:  Pregnancy: G3P2002 at 3731w5d  1. Supervision of high risk pregnancy, antepartum FHT normal  2. Diabetes mellitus affecting pregnancy in second  trimester Increase NPH to 30/30, aspart 18/18/18 US for growth tomorrow  3. Chronic hypertension during pregnancy, antepartum controlled  4. Obesity in pregnancy   5. GBS bacteriuria Intrapartum PPX   Preterm labor symptoms and general obstetric precautions including but not limited to vaginal bleeding, contractions, leaking of fluid and fetal movement were reviewed in detail with the patient. Please refer to After Visit Summary for other counseling recommendations.  Return for hr ob fu with udip each visit.   Levie HeritageStinson, Izabell Schalk J, DO

## 2018-03-01 ENCOUNTER — Encounter (HOSPITAL_COMMUNITY): Payer: Self-pay

## 2018-03-01 ENCOUNTER — Other Ambulatory Visit (HOSPITAL_COMMUNITY): Payer: Self-pay | Admitting: *Deleted

## 2018-03-01 ENCOUNTER — Ambulatory Visit (HOSPITAL_COMMUNITY)
Admission: RE | Admit: 2018-03-01 | Discharge: 2018-03-01 | Disposition: A | Discharge status: Home / Self Care | Payer: Medicaid Other | Source: Ambulatory Visit | Attending: Obstetrics & Gynecology | Admitting: Obstetrics & Gynecology

## 2018-03-01 DIAGNOSIS — O24112 Pre-existing diabetes mellitus, type 2, in pregnancy, second trimester: Secondary | ICD-10-CM | POA: Insufficient documentation

## 2018-03-01 DIAGNOSIS — O10012 Pre-existing essential hypertension complicating pregnancy, second trimester: Secondary | ICD-10-CM | POA: Diagnosis not present

## 2018-03-01 DIAGNOSIS — O359XX0 Maternal care for (suspected) fetal abnormality and damage, unspecified, not applicable or unspecified: Secondary | ICD-10-CM

## 2018-03-01 DIAGNOSIS — O10919 Unspecified pre-existing hypertension complicating pregnancy, unspecified trimester: Secondary | ICD-10-CM

## 2018-03-01 DIAGNOSIS — O99212 Obesity complicating pregnancy, second trimester: Secondary | ICD-10-CM

## 2018-03-01 DIAGNOSIS — O24313 Unspecified pre-existing diabetes mellitus in pregnancy, third trimester: Principal | ICD-10-CM

## 2018-03-01 DIAGNOSIS — Z3A27 27 weeks gestation of pregnancy: Secondary | ICD-10-CM | POA: Diagnosis not present

## 2018-03-01 DIAGNOSIS — Z362 Encounter for other antenatal screening follow-up: Secondary | ICD-10-CM | POA: Diagnosis not present

## 2018-03-01 DIAGNOSIS — O88212 Thromboembolism in pregnancy, second trimester: Secondary | ICD-10-CM | POA: Insufficient documentation

## 2018-03-01 DIAGNOSIS — O358XX0 Maternal care for other (suspected) fetal abnormality and damage, not applicable or unspecified: Secondary | ICD-10-CM | POA: Diagnosis not present

## 2018-03-01 DIAGNOSIS — Z794 Long term (current) use of insulin: Principal | ICD-10-CM

## 2018-03-01 DIAGNOSIS — IMO0001 Reserved for inherently not codable concepts without codable children: Secondary | ICD-10-CM

## 2018-03-01 LAB — CBC
HEMOGLOBIN: 11.3 g/dL (ref 11.1–15.9)
Hematocrit: 33.1 % — ABNORMAL LOW (ref 34.0–46.6)
MCH: 30.7 pg (ref 26.6–33.0)
MCHC: 34.1 g/dL (ref 31.5–35.7)
MCV: 90 fL (ref 79–97)
PLATELETS: 240 10*3/uL (ref 150–450)
RBC: 3.68 x10E6/uL — AB (ref 3.77–5.28)
RDW: 13.1 % (ref 12.3–15.4)
WBC: 8.8 10*3/uL (ref 3.4–10.8)

## 2018-03-01 LAB — HIV ANTIBODY (ROUTINE TESTING W REFLEX): HIV Screen 4th Generation wRfx: NONREACTIVE

## 2018-03-01 LAB — RPR: RPR Ser Ql: NONREACTIVE

## 2018-03-08 ENCOUNTER — Ambulatory Visit (HOSPITAL_COMMUNITY)
Admission: RE | Admit: 2018-03-08 | Discharge: 2018-03-08 | Disposition: A | Discharge status: Home / Self Care | Payer: Medicaid Other | Source: Ambulatory Visit | Attending: Obstetrics & Gynecology | Admitting: Obstetrics & Gynecology

## 2018-03-08 ENCOUNTER — Other Ambulatory Visit (HOSPITAL_COMMUNITY): Payer: Self-pay

## 2018-03-08 ENCOUNTER — Encounter (HOSPITAL_COMMUNITY): Payer: Self-pay

## 2018-03-08 DIAGNOSIS — O10919 Unspecified pre-existing hypertension complicating pregnancy, unspecified trimester: Secondary | ICD-10-CM

## 2018-03-08 DIAGNOSIS — O10913 Unspecified pre-existing hypertension complicating pregnancy, third trimester: Secondary | ICD-10-CM | POA: Diagnosis not present

## 2018-03-08 DIAGNOSIS — O24113 Pre-existing diabetes mellitus, type 2, in pregnancy, third trimester: Secondary | ICD-10-CM | POA: Insufficient documentation

## 2018-03-08 DIAGNOSIS — O99213 Obesity complicating pregnancy, third trimester: Secondary | ICD-10-CM

## 2018-03-08 DIAGNOSIS — O10013 Pre-existing essential hypertension complicating pregnancy, third trimester: Secondary | ICD-10-CM | POA: Diagnosis present

## 2018-03-08 DIAGNOSIS — E119 Type 2 diabetes mellitus without complications: Secondary | ICD-10-CM | POA: Diagnosis not present

## 2018-03-08 DIAGNOSIS — Z3A28 28 weeks gestation of pregnancy: Secondary | ICD-10-CM | POA: Diagnosis not present

## 2018-03-09 ENCOUNTER — Encounter: Payer: Self-pay | Admitting: Obstetrics and Gynecology

## 2018-03-15 ENCOUNTER — Ambulatory Visit (HOSPITAL_COMMUNITY): Payer: Medicaid Other

## 2018-03-16 ENCOUNTER — Other Ambulatory Visit (HOSPITAL_COMMUNITY)
Admission: RE | Admit: 2018-03-16 | Discharge: 2018-03-16 | Disposition: A | Discharge status: Home / Self Care | Payer: Medicaid Other | Source: Ambulatory Visit | Attending: Advanced Practice Midwife | Admitting: Advanced Practice Midwife

## 2018-03-16 ENCOUNTER — Ambulatory Visit (INDEPENDENT_AMBULATORY_CARE_PROVIDER_SITE_OTHER): Payer: Medicaid Other | Admitting: Advanced Practice Midwife

## 2018-03-16 VITALS — BP 137/72 | HR 85 | Wt 221.5 lb

## 2018-03-16 DIAGNOSIS — O10913 Unspecified pre-existing hypertension complicating pregnancy, third trimester: Secondary | ICD-10-CM | POA: Insufficient documentation

## 2018-03-16 DIAGNOSIS — R81 Glycosuria: Secondary | ICD-10-CM

## 2018-03-16 DIAGNOSIS — N898 Other specified noninflammatory disorders of vagina: Secondary | ICD-10-CM | POA: Diagnosis present

## 2018-03-16 DIAGNOSIS — O0993 Supervision of high risk pregnancy, unspecified, third trimester: Secondary | ICD-10-CM | POA: Diagnosis not present

## 2018-03-16 DIAGNOSIS — Z3A30 30 weeks gestation of pregnancy: Secondary | ICD-10-CM | POA: Insufficient documentation

## 2018-03-16 DIAGNOSIS — O24912 Unspecified diabetes mellitus in pregnancy, second trimester: Secondary | ICD-10-CM

## 2018-03-16 DIAGNOSIS — R8271 Bacteriuria: Secondary | ICD-10-CM

## 2018-03-16 DIAGNOSIS — B373 Candidiasis of vulva and vagina: Secondary | ICD-10-CM

## 2018-03-16 DIAGNOSIS — O24113 Pre-existing diabetes mellitus, type 2, in pregnancy, third trimester: Secondary | ICD-10-CM | POA: Diagnosis not present

## 2018-03-16 DIAGNOSIS — B3731 Acute candidiasis of vulva and vagina: Secondary | ICD-10-CM

## 2018-03-16 DIAGNOSIS — O0990 Supervision of high risk pregnancy, unspecified, unspecified trimester: Secondary | ICD-10-CM

## 2018-03-16 DIAGNOSIS — O10919 Unspecified pre-existing hypertension complicating pregnancy, unspecified trimester: Secondary | ICD-10-CM

## 2018-03-16 LAB — POCT URINALYSIS DIP (DEVICE)
Bilirubin Urine: NEGATIVE
GLUCOSE, UA: 500 mg/dL — AB
Hgb urine dipstick: NEGATIVE
KETONES UR: 15 mg/dL — AB
Nitrite: POSITIVE — AB
PROTEIN: NEGATIVE mg/dL
SPECIFIC GRAVITY, URINE: 1.015 (ref 1.005–1.030)
Urobilinogen, UA: 0.2 mg/dL (ref 0.0–1.0)
pH: 7 (ref 5.0–8.0)

## 2018-03-16 LAB — GLUCOSE, CAPILLARY: GLUCOSE-CAPILLARY: 216 mg/dL — AB (ref 70–99)

## 2018-03-16 MED ORDER — INSULIN ASPART 100 UNIT/ML ~~LOC~~ SOLN: 20.0000 [IU] | Freq: Three times a day (TID) | SUBCUTANEOUS | 12 refills | Status: DC

## 2018-03-16 MED ORDER — INSULIN NPH (HUMAN) (ISOPHANE) 100 UNIT/ML ~~LOC~~ SUSP: 34.0000 [IU] | Freq: Two times a day (BID) | SUBCUTANEOUS | 5 refills | Status: DC

## 2018-03-16 NOTE — Progress Notes (Signed)
Pt states has been having vaginal itching but no d/c nor odor. Just started yesterday.

## 2018-03-16 NOTE — Patient Instructions (Signed)
TDaP Vaccine Pregnancy Get the Whooping Cough Vaccine While You Are Pregnant (CDC)  It is important for women to get the whooping cough vaccine in the third trimester of each pregnancy. Vaccines are the best way to prevent this disease. There are 2 different whooping cough vaccines. Both vaccines combine protection against whooping cough, tetanus and diphtheria, but they are for different age groups: Tdap: for everyone 11 years or older, including pregnant women  DTaP: for children 2 months through 6 years of age  You need the whooping cough vaccine during each of your pregnancies The recommended time to get the shot is during your 27th through 36th week of pregnancy, preferably during the earlier part of this time period. The Centers for Disease Control and Prevention (CDC) recommends that pregnant women receive the whooping cough vaccine for adolescents and adults (called Tdap vaccine) during the third trimester of each pregnancy. The recommended time to get the shot is during your 27th through 36th week of pregnancy, preferably during the earlier part of this time period. This replaces the original recommendation that pregnant women get the vaccine only if they had not previously received it. The American College of Obstetricians and Gynecologists and the American College of Nurse-Midwives support this recommendation.  You should get the whooping cough vaccine while pregnant to pass protection to your baby frame support disabled and/or not supported in this browser  Learn why Cheryl Curry decided to get the whooping cough vaccine in her 3rd trimester of pregnancy and how her baby girl was born with some protection against the disease. Also available on YouTube. After receiving the whooping cough vaccine, your body will create protective antibodies (proteins produced by the body to fight off diseases) and pass some of them to your baby before birth. These antibodies provide your baby some short-term  protection against whooping cough in early life. These antibodies can also protect your baby from some of the more serious complications that come along with whooping cough. Your protective antibodies are at their highest about 2 weeks after getting the vaccine, but it takes time to pass them to your baby. So the preferred time to get the whooping cough vaccine is early in your third trimester. The amount of whooping cough antibodies in your body decreases over time. That is why CDC recommends you get a whooping cough vaccine during each pregnancy. Doing so allows each of your babies to get the greatest number of protective antibodies from you. This means each of your babies will get the best protection possible against this disease.  Getting the whooping cough vaccine while pregnant is better than getting the vaccine after you give birth Whooping cough vaccination during pregnancy is ideal so your baby will have short-term protection as soon as he is born. This early protection is important because your baby will not start getting his whooping cough vaccines until he is 2 months old. These first few months of life are when your baby is at greatest risk for catching whooping cough. This is also when he's at greatest risk for having severe, potentially life-threating complications from the infection. To avoid that gap in protection, it is best to get a whooping cough vaccine during pregnancy. You will then pass protection to your baby before he is born. To continue protecting your baby, he should get whooping cough vaccines starting at 2 months old. You may never have gotten the Tdap vaccine before and did not get it during this pregnancy. If so, you should make sure   to get the vaccine immediately after you give birth, before leaving the hospital or birthing center. It will take about 2 weeks before your body develops protection (antibodies) in response to the vaccine. Once you have protection from the vaccine,  you are less likely to give whooping cough to your newborn while caring for him. But remember, your baby will still be at risk for catching whooping cough from others. A recent study looked to see how effective Tdap was at preventing whooping cough in babies whose mothers got the vaccine while pregnant or in the hospital after giving birth. The study found that getting Tdap between 27 through 36 weeks of pregnancy is 85% more effective at preventing whooping cough in babies younger than 2 months old. Blood tests cannot tell if you need a whooping cough vaccine There are no blood tests that can tell you if you have enough antibodies in your body to protect yourself or your baby against whooping cough. Even if you have been sick with whooping cough in the past or previously received the vaccine, you still should get the vaccine during each pregnancy. Breastfeeding may pass some protective antibodies onto your baby By breastfeeding, you may pass some antibodies you have made in response to the vaccine to your baby. When you get a whooping cough vaccine during your pregnancy, you will have antibodies in your breast milk that you can share with your baby as soon as your milk comes in. However, your baby will not get protective antibodies immediately if you wait to get the whooping cough vaccine until after delivering your baby. This is because it takes about 2 weeks for your body to create antibodies. Learn more about the health benefits of breastfeeding.    Pregnancy and Influenza Influenza, also called the flu, is an infection of the respiratory tract. If you are pregnant, you are more likely to catch the flu. You are also more likely to have a more serious case of the flu. This is because pregnancy lowers your body's ability to fight off infections (it weakens your immune system). It also puts additional stress on your heart and lungs, which makes you more likely to have complications. Having a bad case of  the flu, especially with a high fever, can be dangerous for your developing baby. It can cause you to go into early labor. How do people get the flu? The flu is caused by the influenza virus. This virus is common every year in the fall and winter. It spreads when virus particles get passed from person to person. You can get the virus if you are near a sick person who is coughing or sneezing. You can also get the virus if you touch something that has the virus on it and then touch your face. How can I protect myself against the flu?  Get a flu shot. The best way to prevent the flu is to get a flu shot before flu season starts. The flu shot is not dangerous for your developing baby. It may even help protect your baby from the flu for up to 6 months after birth. The flu shot is one type of flu vaccine. Another type is a nasal spray vaccine. Do not get the nasal spray vaccine. It is not approved for pregnancy.  Do not come in close contact with sick people.  Do not share food, drinks, or utensils with other people.  Wash your hands often. Use hand sanitizer when soap and water are not available.  What should I do if I have flu symptoms? If you have any flu symptoms, call your health care provider right away. Flu symptoms include:  Fever or chills.  Muscle aches.  Headache.  Sore throat.  Nasal congestion.  Cough.  Feeling tired.  Loss of appetite.  Vomiting.  Diarrhea.  You may be able to take an antiviral medicine to keep the flu from becoming severe and to shorten how long it lasts. What should I do at home if I am diagnosed with the flu?  Do not take any medicine, including cold or flu medicine, unless directed by your health care provider.  If you take antiviral medicine, make sure you finish it even if you start to feel better.  Drink enough fluid to keep your urine clear or pale yellow.  Get plenty of rest. When would I seek immediate medical care if I have the  flu?  You have trouble breathing.  You have chest pain.  You begin to have labor pains.  You have a high fever that does not go down after you take medicine.  You do not feel your baby move.  You have diarrhea or vomiting that will not go away. This information is not intended to replace advice given to you by your health care provider. Make sure you discuss any questions you have with your health care provider. Document Released: 03/27/2008 Document Revised: 10/31/2015 Document Reviewed: 04/21/2013 Elsevier Interactive Patient Education  2017 [employer].

## 2018-03-16 NOTE — Progress Notes (Signed)
PRENATAL VISIT NOTE  Subjective:  Cheryl Curry is a 32 y.o. G3P2002 at [redacted]w[redacted]d being seen today for ongoing prenatal care.  She is currently monitored for the following issues for this high-risk pregnancy and has DM2 in pregnancy; Supervision of high risk pregnancy, antepartum; Medication exposure during first trimester of pregnancy; Chronic hypertension during pregnancy, antepartum; GBS bacteriuria; and Obesity in pregnancy on their problem list.  Patient reports occasional contractions when she's on her feet a lot (none now) and vaginal irritation.  Contractions: Not present. Vag. Bleeding: None.  Movement: Present. Denies leaking of fluid.   The following portions of the patient's history were reviewed and updated as appropriate: allergies, current medications, past family history, past medical history, past social history, past surgical history and problem list. Problem list updated.  Objective:   Vitals:   03/16/18 1052  BP: 137/72  Pulse: 85  Weight: 221 lb 8 oz (100.5 kg)    Fetal Status: Fetal Heart Rate (bpm): 146 Fundal Height: 37 cm Movement: Present     General:  Alert, oriented and cooperative. Patient is in no acute distress.  Skin: Skin is warm and dry. No rash noted.   Cardiovascular: Normal heart rate noted  Respiratory: Normal respiratory effort, no problems with respiration noted  Abdomen: Soft, gravid, appropriate for gestational age.  Pain/Pressure: Present     Pelvic: Cervical exam deferred        Extremities: Normal range of motion.  Edema: Trace  Mental Status: Normal mood and affect. Normal behavior. Normal judgment and thought content.   Random CBG 216 today.   Very random blood sugar testing. Pt states it is difficult to eat regularly and check CBGs at work due to being busy (but only works 4 hours shifts) Declines note to allow breaks. Missed two days of insulin due to not having testing strips in stock at [facility]  Fasting CBGS  160-209 Postprandial 128-327, mostly high 100's-mid-200's   Assessment and Plan:  Pregnancy: G3P2002 at [redacted]w[redacted]d 1. Vaginal itching  - Cervicovaginal ancillary only  2. Pregnancy complicated by pre-existing type 2 diabetes, third trimester--Uncontrolled  - Random CBG - Hemoglobin A1c Increase insulin per consult w/ Dr. Earlene Plater.  - insulin NPH Human (HUMULIN N,NOVOLIN N) 100 UNIT/ML injection; Inject 0.34 mLs (34 Units total) into the skin 2 (two) times daily. 20 u q am and q hs  Dispense: 10 mL; Refill: 5 - insulin aspart (NOVOLOG) 100 UNIT/ML injection; Inject 20 Units into the skin 3 (three) times daily before meals.  Dispense: 10 mL; Refill: 12 - Lengthy discussion about importance of testing blood sugars, bringing log book and keeping appointments. Discussed risks of uncontrolled DM in pregnancy including delayed fetal lung maturity, IUFD and shoulder dystocia possibly resulting brachial plexus palsy, brain damage, intrapartum death and extensive obstetric lacerations. Patient verbalizes understanding.  - Encouraged to use log sheet or App to document CBGS to help make it easier to see trends and for providers to chart.  - F/U 1 week to check blood sugars - BPP EOW per MFM until 32 weeks.  - Growth [place] 10/22  4. Supervision of high risk pregnancy, antepartum  5. Glucosuria  - POCT Glucose (CBG)  6. Chronic hypertension during pregnancy, antepartum - Continue labetalol   7. GBS bacteriuria - PCN in labor   Preterm labor symptoms and general obstetric precautions including but not limited to vaginal bleeding, contractions, leaking of fluid and fetal movement were reviewed in detail with the patient. Please refer to After  Visit Summary for other counseling recommendations.  Return in about 1 week (around 03/23/2018) for ROB.  Future Appointments  Date Time Provider Department Center  03/23/2018  1:15 PM Conan Bowens, MD [facility]  03/29/2018  1:00 PM [facility] [place] 3 [facility]  [facility]  03/30/2018  9:15 AM [place], MD [facility]  04/05/2018  9:30 AM [facility] [place] 1 [facility]  04/06/2018  9:15 AM Tamera Stands, DO [facility]    [place], [place]

## 2018-03-17 LAB — CERVICOVAGINAL ANCILLARY ONLY
BACTERIAL VAGINITIS: NEGATIVE
CHLAMYDIA, DNA PROBE: NEGATIVE
Candida vaginitis: POSITIVE — AB
NEISSERIA GONORRHEA: NEGATIVE
Trichomonas: NEGATIVE

## 2018-03-17 LAB — HEMOGLOBIN A1C
Est. average glucose Bld gHb Est-mCnc: 209 mg/dL
Hgb A1c MFr Bld: 8.9 % — ABNORMAL HIGH (ref 4.8–5.6)

## 2018-03-18 MED ORDER — TERCONAZOLE 0.4 % VA CREA: 1.0000 | TOPICAL_CREAM | Freq: Every day | VAGINAL | 1 refills | Status: DC

## 2018-03-18 NOTE — Addendum Note (Signed)
Addended by: Dorathy Kinsman on: 03/18/2018 11:19 AM   Modules accepted: Orders

## 2018-03-22 ENCOUNTER — Encounter: Payer: Self-pay | Admitting: Advanced Practice Midwife

## 2018-03-22 ENCOUNTER — Ambulatory Visit (HOSPITAL_COMMUNITY): Payer: Medicaid Other

## 2018-03-22 DIAGNOSIS — O35BXX0 Maternal care for other (suspected) fetal abnormality and damage, fetal cardiac anomalies, not applicable or unspecified: Secondary | ICD-10-CM | POA: Insufficient documentation

## 2018-03-22 DIAGNOSIS — O358XX0 Maternal care for other (suspected) fetal abnormality and damage, not applicable or unspecified: Secondary | ICD-10-CM | POA: Insufficient documentation

## 2018-03-23 ENCOUNTER — Ambulatory Visit (INDEPENDENT_AMBULATORY_CARE_PROVIDER_SITE_OTHER): Payer: Medicaid Other | Admitting: Obstetrics and Gynecology

## 2018-03-23 ENCOUNTER — Encounter: Payer: Self-pay | Admitting: Obstetrics and Gynecology

## 2018-03-23 VITALS — BP 119/88 | HR 88 | Wt 218.6 lb

## 2018-03-23 DIAGNOSIS — O358XX0 Maternal care for other (suspected) fetal abnormality and damage, not applicable or unspecified: Secondary | ICD-10-CM | POA: Diagnosis not present

## 2018-03-23 DIAGNOSIS — O24912 Unspecified diabetes mellitus in pregnancy, second trimester: Secondary | ICD-10-CM

## 2018-03-23 DIAGNOSIS — R8271 Bacteriuria: Secondary | ICD-10-CM

## 2018-03-23 DIAGNOSIS — O10919 Unspecified pre-existing hypertension complicating pregnancy, unspecified trimester: Secondary | ICD-10-CM

## 2018-03-23 DIAGNOSIS — Z23 Encounter for immunization: Secondary | ICD-10-CM | POA: Diagnosis not present

## 2018-03-23 DIAGNOSIS — O10913 Unspecified pre-existing hypertension complicating pregnancy, third trimester: Secondary | ICD-10-CM

## 2018-03-23 DIAGNOSIS — O0990 Supervision of high risk pregnancy, unspecified, unspecified trimester: Secondary | ICD-10-CM

## 2018-03-23 DIAGNOSIS — O24913 Unspecified diabetes mellitus in pregnancy, third trimester: Secondary | ICD-10-CM

## 2018-03-23 DIAGNOSIS — O0993 Supervision of high risk pregnancy, unspecified, third trimester: Secondary | ICD-10-CM | POA: Diagnosis present

## 2018-03-23 DIAGNOSIS — O35BXX0 Maternal care for other (suspected) fetal abnormality and damage, fetal cardiac anomalies, not applicable or unspecified: Secondary | ICD-10-CM

## 2018-03-23 LAB — POCT URINALYSIS DIP (DEVICE)
BILIRUBIN URINE: NEGATIVE
Glucose, UA: 500 mg/dL — AB
KETONES UR: NEGATIVE mg/dL
Nitrite: POSITIVE — AB
PH: 7 (ref 5.0–8.0)
Protein, ur: NEGATIVE mg/dL
SPECIFIC GRAVITY, URINE: 1.015 (ref 1.005–1.030)
Urobilinogen, UA: 0.2 mg/dL (ref 0.0–1.0)

## 2018-03-23 MED ORDER — INSULIN NPH (HUMAN) (ISOPHANE) 100 UNIT/ML ~~LOC~~ SUSP: 38.0000 [IU] | Freq: Two times a day (BID) | SUBCUTANEOUS | 5 refills | Status: DC

## 2018-03-23 NOTE — Progress Notes (Signed)
   PRENATAL VISIT NOTE  Subjective:  Cheryl Curry is a 32 y.o. G3P2002 at [redacted]w[redacted]d being seen today for ongoing prenatal care.  She is currently monitored for the following issues for this high-risk pregnancy and has DM2 in pregnancy; Supervision of high risk pregnancy, antepartum; Medication exposure during first trimester of pregnancy; Chronic hypertension during pregnancy, antepartum; GBS bacteriuria; Obesity in pregnancy; and Fetal cardiac anomaly affecting pregnancy, antepartum on their problem list.  Patient reports occasional contractions.  Contractions: Not present. Vag. Bleeding: None.  Movement: Present. Denies leaking of fluid.   The following portions of the patient's history were reviewed and updated as appropriate: allergies, current medications, past family history, past medical history, past social history, past surgical history and problem list. Problem list updated.  Objective:   Vitals:   03/23/18 1339 03/23/18 1345  BP: (!) 147/96 119/88  Pulse: (!) 101 88  Weight: 218 lb 9.6 oz (99.2 kg)    Fetal Status: Fetal Heart Rate (bpm): 147   Movement: Present     General:  Alert, oriented and cooperative. Patient is in no acute distress.  Skin: Skin is warm and dry. No rash noted.   Cardiovascular: Normal heart rate noted  Respiratory: Normal respiratory effort, no problems with respiration noted  Abdomen: Soft, gravid, appropriate for gestational age.  Pain/Pressure: Present     Pelvic: Cervical exam deferred        Extremities: Normal range of motion.  Edema: Trace  Mental Status: Normal mood and affect. Normal behavior. Normal judgment and thought content.   Assessment and Plan:  Pregnancy: G3P2002 at [redacted]w[redacted]d  1. Supervision of high risk pregnancy, antepartum  2. Diabetes mellitus affecting pregnancy in third trimester lantus 34 units BID novolog 20 units TID Fg: all > 130 PP: 160-200+ Will increase lantus to 38 units BID, keep novolog the same To start  testing next week  3. GBS bacteriuria ppx in labor  4. 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 Chronic hypertension during pregnancy, antepartum - Labetalol 600 mg TID, has not taken labetalol since last week, reviewed importance of taking, patient states she will pick it up - Not taking baby ASA, has not taken in a while, states she cannot afford To start testing next week  Preterm labor symptoms and general obstetric precautions including but not limited to vaginal bleeding, contractions, leaking of fluid and fetal movement were reviewed in detail with the patient. Please refer to After Visit Summary for other counseling recommendations.  Return in about 2 weeks (around 04/06/2018) for OB visit (MD), BPP, NST.  Future Appointments  Date Time Provider Department Center  03/29/2018  1:00 PM [facility]-M[facility] [place] 3 WH-MFCUS MFC-US  03/30/2018  9:15 AM Plaza Bing, MD [facility]  04/05/2018  9:30 AM [facility] [place] 1 WH-MFCUS MFC-US  04/06/2018  9:15 AM Tamera Stands, DO [facility]    Conan Bowens, MD

## 2018-03-29 ENCOUNTER — Ambulatory Visit (HOSPITAL_COMMUNITY)
Admission: RE | Admit: 2018-03-29 | Discharge: 2018-03-29 | Disposition: A | Discharge status: Home / Self Care | Payer: Medicaid Other | Source: Ambulatory Visit | Attending: Obstetrics & Gynecology | Admitting: Obstetrics & Gynecology

## 2018-03-29 ENCOUNTER — Encounter (HOSPITAL_COMMUNITY): Payer: Self-pay

## 2018-03-29 DIAGNOSIS — Z3A31 31 weeks gestation of pregnancy: Secondary | ICD-10-CM | POA: Diagnosis not present

## 2018-03-29 DIAGNOSIS — Z794 Long term (current) use of insulin: Secondary | ICD-10-CM | POA: Insufficient documentation

## 2018-03-29 DIAGNOSIS — O24313 Unspecified pre-existing diabetes mellitus in pregnancy, third trimester: Secondary | ICD-10-CM

## 2018-03-29 DIAGNOSIS — O10013 Pre-existing essential hypertension complicating pregnancy, third trimester: Secondary | ICD-10-CM | POA: Insufficient documentation

## 2018-03-29 DIAGNOSIS — O35BXX0 Maternal care for other (suspected) fetal abnormality and damage, fetal cardiac anomalies, not applicable or unspecified: Secondary | ICD-10-CM

## 2018-03-29 DIAGNOSIS — O0990 Supervision of high risk pregnancy, unspecified, unspecified trimester: Secondary | ICD-10-CM

## 2018-03-29 DIAGNOSIS — IMO0001 Reserved for inherently not codable concepts without codable children: Secondary | ICD-10-CM

## 2018-03-29 DIAGNOSIS — O24113 Pre-existing diabetes mellitus, type 2, in pregnancy, third trimester: Secondary | ICD-10-CM | POA: Insufficient documentation

## 2018-03-29 DIAGNOSIS — O99210 Obesity complicating pregnancy, unspecified trimester: Secondary | ICD-10-CM

## 2018-03-29 DIAGNOSIS — O99213 Obesity complicating pregnancy, third trimester: Secondary | ICD-10-CM

## 2018-03-29 DIAGNOSIS — O358XX0 Maternal care for other (suspected) fetal abnormality and damage, not applicable or unspecified: Secondary | ICD-10-CM

## 2018-03-29 DIAGNOSIS — O2413 Pre-existing diabetes mellitus, type 2, in the puerperium: Secondary | ICD-10-CM | POA: Diagnosis not present

## 2018-03-30 ENCOUNTER — Ambulatory Visit (INDEPENDENT_AMBULATORY_CARE_PROVIDER_SITE_OTHER): Payer: Medicaid Other | Admitting: Obstetrics & Gynecology

## 2018-03-30 VITALS — BP 127/85 | HR 96 | Wt 225.0 lb

## 2018-03-30 DIAGNOSIS — O2340 Unspecified infection of urinary tract in pregnancy, unspecified trimester: Secondary | ICD-10-CM

## 2018-03-30 DIAGNOSIS — O0990 Supervision of high risk pregnancy, unspecified, unspecified trimester: Secondary | ICD-10-CM

## 2018-03-30 DIAGNOSIS — O358XX0 Maternal care for other (suspected) fetal abnormality and damage, not applicable or unspecified: Secondary | ICD-10-CM

## 2018-03-30 DIAGNOSIS — O24913 Unspecified diabetes mellitus in pregnancy, third trimester: Secondary | ICD-10-CM

## 2018-03-30 DIAGNOSIS — O35BXX0 Maternal care for other (suspected) fetal abnormality and damage, fetal cardiac anomalies, not applicable or unspecified: Secondary | ICD-10-CM

## 2018-03-30 LAB — POCT URINALYSIS DIP (DEVICE)
BILIRUBIN URINE: NEGATIVE
Glucose, UA: NEGATIVE mg/dL
Hgb urine dipstick: NEGATIVE
Ketones, ur: NEGATIVE mg/dL
NITRITE: POSITIVE — AB
PH: 7 (ref 5.0–8.0)
Protein, ur: NEGATIVE mg/dL
Specific Gravity, Urine: 1.02 (ref 1.005–1.030)
Urobilinogen, UA: 0.2 mg/dL (ref 0.0–1.0)

## 2018-03-30 MED ORDER — INSULIN NPH (HUMAN) (ISOPHANE) 100 UNIT/ML ~~LOC~~ SUSP: SUBCUTANEOUS | 5 refills | Status: DC

## 2018-03-30 MED ORDER — INSULIN ASPART 100 UNIT/ML ~~LOC~~ SOLN: SUBCUTANEOUS | 12 refills | Status: DC

## 2018-03-30 NOTE — Progress Notes (Signed)
   PRENATAL VISIT NOTE  Subjective:  Cheryl Curry is a 32 y.o. G3P2002 at [redacted]w[redacted]d being seen today for ongoing prenatal care.  She is currently monitored for the following issues for this high-risk pregnancy and has DM2 in pregnancy; Supervision of high risk pregnancy, antepartum; Medication exposure during first trimester of pregnancy; Chronic hypertension during pregnancy, antepartum; GBS bacteriuria; Obesity in pregnancy; and Fetal cardiac anomaly affecting pregnancy, antepartum on their problem list.  Patient reports no complaints.  Contractions: Irritability. Vag. Bleeding: None.  Movement: Present. Denies leaking of fluid.   The following portions of the patient's history were reviewed and updated as appropriate: allergies, current medications, past family history, past medical history, past social history, past surgical history and problem list. Problem list updated.  Objective:   Vitals:   03/30/18 0950  BP: 127/85  Pulse: 96  Weight: 225 lb (102.1 kg)    Fetal Status: Fetal Heart Rate (bpm): 147   Movement: Present     General:  Alert, oriented and cooperative. Patient is in no acute distress.  Skin: Skin is warm and dry. No rash noted.   Cardiovascular: Normal heart rate noted  Respiratory: Normal respiratory effort, no problems with respiration noted  Abdomen: Soft, gravid, appropriate for gestational age.  Pain/Pressure: Present     Pelvic: Cervical exam deferred        Extremities: Normal range of motion.  Edema: Trace  Mental Status: Normal mood and affect. Normal behavior. Normal judgment and thought content.   Assessment and Plan:  Pregnancy: G3P2002 at [redacted]w[redacted]d  1. Diabetes mellitus affecting pregnancy in third trimester   Blood sugars reviewed. Only normal at lunchtime.  Other insulin dosages adjusted.  Patient refused Metformin, had GI upset with this in the past.  - POCT urinalysis dip (device) - insulin NPH Human (HUMULIN N,NOVOLIN N) 100 UNIT/ML  injection; 38 units in the morning and 42 units at bedtime  Dispense: 10 mL; Refill: 5 - insulin aspart (NOVOLOG) 100 UNIT/ML injection; 25 units with breakfast; 20 units with lunch and 25 units with dinner  Dispense: 10 mL; Refill: 12  2. Anomaly of heart of fetus affecting pregnancy, antepartum, single or unspecified fetus Biventricular hypertrophy, follow up 04/01/18  3. Urinary tract infection in mother during pregnancy, antepartum Had E.coli UTi two months ago, never had TOC. Will be done today. - Culture, OB Urine  4. Supervision of high risk pregnancy, antepartum Preterm labor symptoms and general obstetric precautions including but not limited to vaginal bleeding, contractions, leaking of fluid and fetal movement were reviewed in detail with the patient. Please refer to After Visit Summary for other counseling recommendations.  Return in about 1 week (around 04/06/2018) for OB Visit (HOB).  Future Appointments  Date Time Provider Department Center  04/05/2018  9:30 AM [facility] [place] 1 WH-M[facility]US MFC-US  04/06/2018  9:15 AM Tamera Stands, DO [facility]  04/06/2018 11:15 AM [facility] NST [facility]    Jaynie Collins, MD

## 2018-03-30 NOTE — Patient Instructions (Signed)
Return to clinic for any scheduled appointments or obstetric concerns, or go to [facility] for evaluation  

## 2018-04-01 ENCOUNTER — Telehealth: Payer: Self-pay | Admitting: General Practice

## 2018-04-01 NOTE — Telephone Encounter (Signed)
Patient called & left message requesting UTI results. Per [facility], specimen couldn't be ran due to insufficient sample. Called and explained to patient, discussed she can come by the office and leave a new sample for culture. Discussed without that, we don't know what medication she needs for treatment. Discussed she can use OTC Azo and drink lots of water over the weekend in the meantime and sometimes that helps. Patient verbalized understanding to all & had no questions.

## 2018-04-05 ENCOUNTER — Ambulatory Visit (HOSPITAL_COMMUNITY): Admission: RE | Admit: 2018-04-05 | Payer: Medicaid Other | Source: Ambulatory Visit

## 2018-04-06 ENCOUNTER — Other Ambulatory Visit: Payer: Self-pay

## 2018-04-06 ENCOUNTER — Telehealth: Payer: Self-pay | Admitting: *Deleted

## 2018-04-06 ENCOUNTER — Ambulatory Visit (HOSPITAL_COMMUNITY): Payer: Medicaid Other | Attending: Obstetrics & Gynecology

## 2018-04-06 ENCOUNTER — Encounter: Payer: Self-pay | Admitting: Family Medicine

## 2018-04-06 NOTE — Progress Notes (Deleted)
   PRENATAL VISIT NOTE  Subjective:  Cheryl Curry is a 32 y.o. G3P2002 at [redacted]w[redacted]d being seen today for ongoing prenatal care.  She is currently monitored for the following issues for this {Blank single:19197::"high-risk","low-risk"} pregnancy and has DM2 in pregnancy; Supervision of high risk pregnancy, antepartum; Medication exposure during first trimester of pregnancy; Chronic hypertension during pregnancy, antepartum; GBS bacteriuria; Obesity in pregnancy; and Fetal cardiac anomaly affecting pregnancy, antepartum on their problem list.  - DM - was on NPH 38/42 + Novolog 25/20/25 - needs repeat fetal ECHO (supposed to be 10/25)  - *place induction orders*** - labs for cHTN [facility]  Patient reports {sx:[id number]}.   .  .   . Denies leaking of fluid.   The following portions of the patient's history were reviewed and updated as appropriate: allergies, current medications, past family history, past medical history, past social history, past surgical history and problem list. Problem list updated.  Objective:  There were no vitals filed for this visit.  Fetal Status:           General:  Alert, oriented and cooperative. Patient is in no acute distress.  Skin: Skin is warm and dry. No rash noted.   Cardiovascular: Normal heart rate noted  Respiratory: Normal respiratory effort, no problems with respiration noted  Abdomen: Soft, gravid, appropriate for gestational age.        Pelvic: {Blank single:19197::"Cervical exam performed","Cervical exam deferred"}        Extremities: Normal range of motion.     Mental Status: Normal mood and affect. Normal behavior. Normal judgment and thought content.   Assessment and Plan:  Pregnancy: G3P2002 at [redacted]w[redacted]d  1. Supervision of high risk pregnancy, antepartum ***  2. Obesity in pregnancy ***  3. Diabetes mellitus affecting pregnancy in third trimester ***  4. Chronic hypertension during pregnancy, antepartum ***  5. History of E. Coli  UTI: Insufficient sample at last visit to run results. -- OB urine culture   {Blank single:19197::"Term","Preterm"} labor symptoms and general obstetric precautions including but not limited to vaginal bleeding, contractions, leaking of fluid and fetal movement were reviewed in detail with the patient. Please refer to After Visit Summary for other counseling recommendations.  No follow-ups on file.  Future Appointments  Date Time Provider Department Center  04/06/2018  9:15 AM Heron Nay [facility]  04/06/2018 11:15 AM [facility] NST [facility]OC  04/06/2018 12:30 PM WH-MFC [place] 1 WH-MFCUS MFC-US  04/11/2018  9:15 AM [facility]OC[facility] NST [facility]  04/18/2018  2:15 PM [facility]OC[facility]A NST [facility]OC  04/18/2018  3:15 PM Allie Bossier, MD [facility]OC    Tamera Stands, DO

## 2018-04-06 NOTE — Telephone Encounter (Signed)
Called pt regarding her missed appointments in our office this morning. She stated that her ride was detained and she was not able to come to the hospital at the scheduled time. She is still planning to keep her [place] appt today @ 1230. Pt reports having 2 episodes of mucousy vaginal discharge yesterday as well as occasional contractions and some back pain. She denied painful urination. I informed her that these sx are normal. She should go to [facility] for regular, strong UC's, fluid leaking from vagina or worsening back pain.  Pt will keep appt in office as scheduled on 11/4 @ 0915.

## 2018-04-11 ENCOUNTER — Ambulatory Visit (INDEPENDENT_AMBULATORY_CARE_PROVIDER_SITE_OTHER): Payer: Medicaid Other | Admitting: Family Medicine

## 2018-04-11 ENCOUNTER — Ambulatory Visit (INDEPENDENT_AMBULATORY_CARE_PROVIDER_SITE_OTHER): Payer: Medicaid Other | Admitting: *Deleted

## 2018-04-11 ENCOUNTER — Ambulatory Visit: Payer: Self-pay

## 2018-04-11 VITALS — BP 124/84 | HR 93 | Wt 227.0 lb

## 2018-04-11 DIAGNOSIS — O24913 Unspecified diabetes mellitus in pregnancy, third trimester: Secondary | ICD-10-CM

## 2018-04-11 DIAGNOSIS — O10919 Unspecified pre-existing hypertension complicating pregnancy, unspecified trimester: Secondary | ICD-10-CM

## 2018-04-11 DIAGNOSIS — O2343 Unspecified infection of urinary tract in pregnancy, third trimester: Secondary | ICD-10-CM

## 2018-04-11 DIAGNOSIS — O35BXX0 Maternal care for other (suspected) fetal abnormality and damage, fetal cardiac anomalies, not applicable or unspecified: Secondary | ICD-10-CM

## 2018-04-11 DIAGNOSIS — O0990 Supervision of high risk pregnancy, unspecified, unspecified trimester: Secondary | ICD-10-CM

## 2018-04-11 DIAGNOSIS — O358XX0 Maternal care for other (suspected) fetal abnormality and damage, not applicable or unspecified: Secondary | ICD-10-CM

## 2018-04-11 DIAGNOSIS — R8271 Bacteriuria: Secondary | ICD-10-CM

## 2018-04-11 LAB — POCT URINALYSIS DIP (DEVICE)
Bilirubin Urine: NEGATIVE
Glucose, UA: 100 mg/dL — AB
Nitrite: POSITIVE — AB
PH: 7 (ref 5.0–8.0)
PROTEIN: NEGATIVE mg/dL
Specific Gravity, Urine: 1.015 (ref 1.005–1.030)
Urobilinogen, UA: 0.2 mg/dL (ref 0.0–1.0)

## 2018-04-11 MED ORDER — METFORMIN HCL 500 MG PO TABS: 500.0000 mg | ORAL_TABLET | Freq: Two times a day (BID) | ORAL | 5 refills | Status: DC

## 2018-04-11 MED ORDER — FLUCONAZOLE 150 MG PO TABS: 150.0000 mg | ORAL_TABLET | Freq: Once | ORAL | 0 refills | Status: AC

## 2018-04-11 MED ORDER — CEPHALEXIN 500 MG PO CAPS: 500.0000 mg | ORAL_CAPSULE | Freq: Four times a day (QID) | ORAL | 2 refills | Status: DC

## 2018-04-11 NOTE — Progress Notes (Signed)
Pt is not taking all meds as prescribed. She reports having greenish vaginal d/c last week - now mucousy. Pt also thinks she may have UTI - urine has strong odor. She denies painful urination.

## 2018-04-11 NOTE — Addendum Note (Signed)
Addended by: Levie Heritage on: 04/11/2018 02:40 PM   Modules accepted: Orders

## 2018-04-11 NOTE — Progress Notes (Signed)
Subjective:  Cheryl Curry is a 33 y.o. G3P2002 at [redacted]w[redacted]d being seen today for ongoing prenatal care.  She is currently monitored for the following issues for this high-risk pregnancy and has DM2 in pregnancy; Supervision of high risk pregnancy, antepartum; Medication exposure during first trimester of pregnancy; Chronic hypertension during pregnancy, antepartum; GBS bacteriuria; Obesity in pregnancy; and Fetal cardiac anomaly affecting pregnancy, antepartum on their problem list.  GDM: Patient taking NPH 42/42, Novolog 25/20/25.  Reports no hypoglycemic episodes.  Tolerating medication well Fasting: 102-143 2hr PP: 106-223  Patient reports dysuria.  Contractions: Irregular. Vag. Bleeding: None.  Movement: Present. Denies leaking of fluid.   The following portions of the patient's history were reviewed and updated as appropriate: allergies, current medications, past family history, past medical history, past social history, past surgical history and problem list. Problem list updated.  Objective:   Vitals:   04/11/18 1019  BP: 124/84  Pulse: 93  Weight: 227 lb (103 kg)    Fetal Status: Fetal Heart Rate (bpm): NST   Movement: Present     General:  Alert, oriented and cooperative. Patient is in no acute distress.  Skin: Skin is warm and dry. No rash noted.   Cardiovascular: Normal heart rate noted  Respiratory: Normal respiratory effort, no problems with respiration noted  Abdomen: Soft, gravid, appropriate for gestational age. Pain/Pressure: Present     Pelvic: Vag. Bleeding: None Vag D/C Character: Mucous   Cervical exam deferred        Extremities: Normal range of motion.     Mental Status: Normal mood and affect. Normal behavior. Normal judgment and thought content.   Urinalysis:      Assessment and Plan:  Pregnancy: G3P2002 at [redacted]w[redacted]d  1. Supervision of high risk pregnancy, antepartum FHT and FH normal  2. Diabetes mellitus affecting pregnancy in third  trimester Start metformin 500mg  BID BPP:  NST reactive  3. Chronic hypertension during pregnancy, antepartum BP normal  4. GBS bacteriuria Intrapartum PPx  5. Anomaly of heart of fetus affecting pregnancy, antepartum, single or unspecified fetus Biventricular hypertrophy resolved  6. UTI in pregnancy Keflex Urine cx  Preterm labor symptoms and general obstetric precautions including but not limited to vaginal bleeding, contractions, leaking of fluid and fetal movement were reviewed in detail with the patient. Please refer to After Visit Summary for other counseling recommendations.  Return in about 1 week (around 04/18/2018) for as scheduled.   Levie Heritage, DO

## 2018-04-11 NOTE — Progress Notes (Signed)

## 2018-04-12 ENCOUNTER — Telehealth: Payer: Self-pay

## 2018-04-12 NOTE — Telephone Encounter (Signed)
Pt calling because she is having mucous like discharge and was wondering if she needed to be seen about it, Pt stated she had a recent appointment but did not get her cervix checked. Requesting a call back on what to do.

## 2018-04-12 NOTE — Telephone Encounter (Signed)
Called pt and informed her that the discharge she is having is normal. She should come to the hospital if she has thin, clear watery discharge. She will have routine pelvic exam @ 36 weeks or sooner if indicated by possible preterm labor sx.  Pt voiced understanding.

## 2018-04-13 LAB — URINE CULTURE, OB REFLEX

## 2018-04-13 LAB — CULTURE, OB URINE

## 2018-04-14 ENCOUNTER — Other Ambulatory Visit (HOSPITAL_COMMUNITY): Payer: Medicaid Other

## 2018-04-18 ENCOUNTER — Ambulatory Visit: Payer: Self-pay

## 2018-04-18 ENCOUNTER — Ambulatory Visit (INDEPENDENT_AMBULATORY_CARE_PROVIDER_SITE_OTHER): Payer: Medicaid Other | Admitting: *Deleted

## 2018-04-18 ENCOUNTER — Encounter: Payer: Self-pay | Admitting: *Deleted

## 2018-04-18 ENCOUNTER — Ambulatory Visit (INDEPENDENT_AMBULATORY_CARE_PROVIDER_SITE_OTHER): Payer: Medicaid Other | Admitting: Family Medicine

## 2018-04-18 VITALS — BP 152/102 | HR 90 | Wt 229.9 lb

## 2018-04-18 DIAGNOSIS — O24913 Unspecified diabetes mellitus in pregnancy, third trimester: Secondary | ICD-10-CM | POA: Diagnosis present

## 2018-04-18 DIAGNOSIS — O10913 Unspecified pre-existing hypertension complicating pregnancy, third trimester: Secondary | ICD-10-CM

## 2018-04-18 DIAGNOSIS — O10919 Unspecified pre-existing hypertension complicating pregnancy, unspecified trimester: Secondary | ICD-10-CM

## 2018-04-18 DIAGNOSIS — R8271 Bacteriuria: Secondary | ICD-10-CM

## 2018-04-18 DIAGNOSIS — O0990 Supervision of high risk pregnancy, unspecified, unspecified trimester: Secondary | ICD-10-CM

## 2018-04-18 DIAGNOSIS — O0993 Supervision of high risk pregnancy, unspecified, third trimester: Secondary | ICD-10-CM

## 2018-04-18 LAB — POCT URINALYSIS DIP (DEVICE)
Bilirubin Urine: NEGATIVE
GLUCOSE, UA: 250 mg/dL — AB
Hgb urine dipstick: NEGATIVE
Ketones, ur: NEGATIVE mg/dL
LEUKOCYTES UA: NEGATIVE
NITRITE: NEGATIVE
PROTEIN: NEGATIVE mg/dL
SPECIFIC GRAVITY, URINE: 1.015 (ref 1.005–1.030)
UROBILINOGEN UA: 0.2 mg/dL (ref 0.0–1.0)
pH: 7 (ref 5.0–8.0)

## 2018-04-18 MED ORDER — INSULIN ASPART 100 UNIT/ML ~~LOC~~ SOLN: SUBCUTANEOUS | 12 refills | Status: DC

## 2018-04-18 MED ORDER — INSULIN NPH (HUMAN) (ISOPHANE) 100 UNIT/ML ~~LOC~~ SUSP: SUBCUTANEOUS | 5 refills | Status: DC

## 2018-04-18 NOTE — Progress Notes (Signed)
   PRENATAL VISIT NOTE  Subjective:  Cheryl Curry is a 32 y.o. G3P2002 at [redacted]w[redacted]d being seen today for ongoing prenatal care.  She is currently monitored for the following issues for this high-risk pregnancy and has DM2 in pregnancy; Supervision of high risk pregnancy, antepartum; Medication exposure during first trimester of pregnancy; Chronic hypertension during pregnancy, antepartum; GBS bacteriuria; Obesity in pregnancy; and Fetal cardiac anomaly affecting pregnancy, antepartum on their problem list.  Patient reports no complaints.  Contractions: Irregular. Vag. Bleeding: None.  Movement: Present. Denies leaking of fluid.   The following portions of the patient's history were reviewed and updated as appropriate: allergies, current medications, past family history, past medical history, past social history, past surgical history and problem list. Problem list updated.  Objective:   Vitals:   04/18/18 1447 04/18/18 1532  BP: (!) 146/99 (!) 152/102  Pulse: 90   Weight: 229 lb 14.4 oz (104.3 kg)     Fetal Status: Fetal Heart Rate (bpm): NST   Movement: Present     General:  Alert, oriented and cooperative. Patient is in no acute distress.  Skin: Skin is warm and dry. No rash noted.   Cardiovascular: Normal heart rate noted  Respiratory: Normal respiratory effort, no problems with respiration noted  Abdomen: Soft, gravid, appropriate for gestational age.  Pain/Pressure: Present     Pelvic: Cervical exam deferred        Extremities: Normal range of motion.     Mental Status: Normal mood and affect. Normal behavior. Normal judgment and thought content.   NST:  Baseline: 140 bpm, Variability: Good {> 6 bpm), Accelerations: Reactive and Decelerations: Absent  Assessment and Plan:  Pregnancy: G3P2002 at [redacted]w[redacted]d  1. Chronic hypertension during pregnancy, antepartum BP is up, taking meds Continue Labetalol (not taking as directed taking 200 tid) rx says 600 tid and ASA Check  labs - CBC - Comprehensive metabolic panel - Protein / creatinine ratio, urine  2. Diabetes mellitus affecting pregnancy in third trimester FBS 73-142 (1 in range--though possibly not true fastings) 2 hour pp 223 (20 of 26 out of range) CBGs are mostly all out of range--significant insulin adjustments made--except lunch which is in range always. - insulin NPH Human (HUMULIN N,NOVOLIN N) 100 UNIT/ML injection; 46 units in the morning and 46 units at bedtime  Dispense: 10 mL; Refill: 5 - insulin aspart (NOVOLOG) 100 UNIT/ML injection; 30 units with breakfast; 20 units with lunch and 30 units with dinner  Dispense: 10 mL; Refill: 12  3. Supervision of high risk pregnancy, antepartum Up to date  4. GBS bacteriuria Will need treatment in labor  Preterm labor symptoms and general obstetric precautions including but not limited to vaginal bleeding, contractions, leaking of fluid and fetal movement were reviewed in detail with the patient. Please refer to After Visit Summary for other counseling recommendations.  Return in 1 week (on 04/25/2018) for as scheduled.  Future Appointments  Date Time Provider Department Center  04/25/2018 12:45 PM WH-MFC [place] 2 WH-MFCUS MFC-US  04/25/2018  2:15 PM WOC-W[facility]A NST [facility] WOC  04/25/2018  3:15 PM Adam Phenix, MD [facility] WOC    Reva Bores, MD

## 2018-04-18 NOTE — Progress Notes (Signed)

## 2018-04-18 NOTE — Progress Notes (Signed)
Pt denies H/A or visual disturbances.  [place] for growth and BPP scheduled on 11/18.

## 2018-04-18 NOTE — Patient Instructions (Signed)

## 2018-04-19 LAB — COMPREHENSIVE METABOLIC PANEL
ALBUMIN: 3.5 g/dL (ref 3.5–5.5)
ALK PHOS: 124 IU/L — AB (ref 39–117)
ALT: 15 IU/L (ref 0–32)
AST: 21 IU/L (ref 0–40)
Albumin/Globulin Ratio: 1.6 (ref 1.2–2.2)
BILIRUBIN TOTAL: 0.4 mg/dL (ref 0.0–1.2)
BUN/Creatinine Ratio: 8 — ABNORMAL LOW (ref 9–23)
BUN: 5 mg/dL — AB (ref 6–20)
CHLORIDE: 105 mmol/L (ref 96–106)
CO2: 17 mmol/L — ABNORMAL LOW (ref 20–29)
CREATININE: 0.66 mg/dL (ref 0.57–1.00)
Calcium: 9 mg/dL (ref 8.7–10.2)
GFR calc Af Amer: 134 mL/min/{1.73_m2} (ref >59)
GFR calc non Af Amer: 116 mL/min/{1.73_m2} (ref >59)
GLUCOSE: 124 mg/dL — AB (ref 65–99)
Globulin, Total: 2.2 g/dL (ref 1.5–4.5)
Potassium: 4 mmol/L (ref 3.5–5.2)
Sodium: 136 mmol/L (ref 134–144)
Total Protein: 5.7 g/dL — ABNORMAL LOW (ref 6.0–8.5)

## 2018-04-19 LAB — CBC
HEMOGLOBIN: 11.3 g/dL (ref 11.1–15.9)
Hematocrit: 31.3 % — ABNORMAL LOW (ref 34.0–46.6)
MCH: 31.4 pg (ref 26.6–33.0)
MCHC: 36.1 g/dL — AB (ref 31.5–35.7)
MCV: 87 fL (ref 79–97)
PLATELETS: 212 10*3/uL (ref 150–450)
RBC: 3.6 x10E6/uL — AB (ref 3.77–5.28)
RDW: 13 % (ref 12.3–15.4)
WBC: 7.7 10*3/uL (ref 3.4–10.8)

## 2018-04-19 LAB — PROTEIN / CREATININE RATIO, URINE
CREATININE, UR: 77.7 mg/dL
PROTEIN UR: 12.5 mg/dL
PROTEIN/CREAT RATIO: 161 mg/g{creat} (ref 0–200)

## 2018-04-20 ENCOUNTER — Encounter (HOSPITAL_COMMUNITY): Payer: Self-pay

## 2018-04-20 ENCOUNTER — Other Ambulatory Visit: Payer: Self-pay

## 2018-04-20 ENCOUNTER — Telehealth: Payer: Self-pay | Admitting: General Practice

## 2018-04-20 ENCOUNTER — Inpatient Hospital Stay (HOSPITAL_COMMUNITY)
Admission: AD | Admit: 2018-04-20 | Discharge: 2018-04-20 | Disposition: A | Discharge status: Home / Self Care | Payer: Medicaid Other | Source: Ambulatory Visit | Attending: Obstetrics & Gynecology | Admitting: Obstetrics & Gynecology

## 2018-04-20 DIAGNOSIS — O10013 Pre-existing essential hypertension complicating pregnancy, third trimester: Secondary | ICD-10-CM | POA: Insufficient documentation

## 2018-04-20 DIAGNOSIS — Z3A35 35 weeks gestation of pregnancy: Secondary | ICD-10-CM | POA: Diagnosis not present

## 2018-04-20 DIAGNOSIS — R030 Elevated blood-pressure reading, without diagnosis of hypertension: Secondary | ICD-10-CM | POA: Diagnosis present

## 2018-04-20 DIAGNOSIS — O10913 Unspecified pre-existing hypertension complicating pregnancy, third trimester: Secondary | ICD-10-CM

## 2018-04-20 LAB — COMPREHENSIVE METABOLIC PANEL
ALT: 17 U/L (ref 0–44)
AST: 24 U/L (ref 15–41)
Albumin: 3.1 g/dL — ABNORMAL LOW (ref 3.5–5.0)
Alkaline Phosphatase: 106 U/L (ref 38–126)
Anion gap: 9 (ref 5–15)
BILIRUBIN TOTAL: 0.5 mg/dL (ref 0.3–1.2)
BUN: 5 mg/dL — ABNORMAL LOW (ref 6–20)
CO2: 19 mmol/L — ABNORMAL LOW (ref 22–32)
CREATININE: 0.69 mg/dL (ref 0.44–1.00)
Calcium: 8.6 mg/dL — ABNORMAL LOW (ref 8.9–10.3)
Chloride: 106 mmol/L (ref 98–111)
Glucose, Bld: 170 mg/dL — ABNORMAL HIGH (ref 70–99)
Potassium: 3.8 mmol/L (ref 3.5–5.1)
Sodium: 134 mmol/L — ABNORMAL LOW (ref 135–145)
TOTAL PROTEIN: 6.7 g/dL (ref 6.5–8.1)

## 2018-04-20 LAB — CBC
HEMATOCRIT: 35.8 % — AB (ref 36.0–46.0)
Hemoglobin: 12 g/dL (ref 12.0–15.0)
MCH: 30.5 pg (ref 26.0–34.0)
MCHC: 33.5 g/dL (ref 30.0–36.0)
MCV: 91.1 fL (ref 80.0–100.0)
Platelets: 215 10*3/uL (ref 150–400)
RBC: 3.93 MIL/uL (ref 3.87–5.11)
RDW: 13.5 % (ref 11.5–15.5)
WBC: 7 10*3/uL (ref 4.0–10.5)
nRBC: 0 % (ref 0.0–0.2)

## 2018-04-20 LAB — URINALYSIS, ROUTINE W REFLEX MICROSCOPIC
BILIRUBIN URINE: NEGATIVE
GLUCOSE, UA: 50 mg/dL — AB
HGB URINE DIPSTICK: NEGATIVE
Ketones, ur: NEGATIVE mg/dL
Leukocytes, UA: NEGATIVE
Nitrite: NEGATIVE
PH: 7 (ref 5.0–8.0)
Protein, ur: NEGATIVE mg/dL
SPECIFIC GRAVITY, URINE: 1.009 (ref 1.005–1.030)

## 2018-04-20 LAB — WET PREP, GENITAL
Clue Cells Wet Prep HPF POC: NONE SEEN
SPERM: NONE SEEN
TRICH WET PREP: NONE SEEN
Yeast Wet Prep HPF POC: NONE SEEN

## 2018-04-20 LAB — PROTEIN / CREATININE RATIO, URINE
Creatinine, Urine: 84 mg/dL
Protein Creatinine Ratio: 0.19 mg/mg{Cre} — ABNORMAL HIGH (ref 0.00–0.15)
TOTAL PROTEIN, URINE: 16 mg/dL

## 2018-04-20 MED ORDER — LABETALOL HCL 5 MG/ML IV SOLN
20.0000 mg | INTRAVENOUS | Status: DC | PRN
  Administered 2018-04-20: 20 mg via INTRAVENOUS
  Administered 2018-04-20: 40 mg via INTRAVENOUS
  Filled 2018-04-20: qty 4

## 2018-04-20 MED ORDER — LABETALOL HCL 5 MG/ML IV SOLN
80.0000 mg | INTRAVENOUS | Status: DC | PRN
  Administered 2018-04-20: 80 mg via INTRAVENOUS
  Filled 2018-04-20: qty 16

## 2018-04-20 MED ORDER — HYDRALAZINE HCL 20 MG/ML IJ SOLN: 10.0000 mg | INTRAMUSCULAR | Status: DC | PRN

## 2018-04-20 MED ORDER — LABETALOL HCL 5 MG/ML IV SOLN
40.0000 mg | INTRAVENOUS | Status: DC | PRN
  Filled 2018-04-20: qty 8

## 2018-04-20 MED ORDER — LABETALOL HCL 200 MG PO TABS: 400.0000 mg | ORAL_TABLET | Freq: Three times a day (TID) | ORAL | 0 refills | Status: DC

## 2018-04-20 MED ORDER — BUTALBITAL-APAP-CAFFEINE 50-325-40 MG PO TABS
2.0000 | ORAL_TABLET | Freq: Once | ORAL | Status: AC
  Administered 2018-04-20: 2 via ORAL
  Filled 2018-04-20: qty 2

## 2018-04-20 NOTE — MAU Provider Note (Signed)
Chief Complaint:  Hypertension   First Provider Initiated Contact with Patient 04/20/18 1517      HPI: Cheryl Curry is a 32 y.o. G3P2002 at [redacted]w[redacted]d who presents to maternity admissions reporting elevated BP and new onset h/a yesterday. Her pain is frontal, constant, dull, not associated with photophobia and with no n/v.  She has not tried any treatments. She is also having irregular cramping/contractions, intermittent in her low abdomen, sometimes 4-5 times per hour, sometimes less.  There are no other associated symptoms. She denies visual disturbances or epigastric pain.   She reports good fetal movement.  HPI  Past Medical History: Past Medical History:  Diagnosis Date  . Diabetes mellitus without complication (HCC)   . Hypertension     Past obstetric history: OB History  Gravida Para Term Preterm AB Living  3 2 2    0 2  SAB TAB Ectopic Multiple Live Births          2    # Outcome Date GA Lbr Len/2nd Weight Sex Delivery Anes PTL Lv  3 Current           2 Term 11/23/06 [redacted]w[redacted]d  3175 g F Vag-Spont None N LIV  1 Term 04/18/03 [redacted]w[redacted]d  3062 g F Vag-Spont EPI  LIV    Past Surgical History: History reviewed. No pertinent surgical history.  Family History: History reviewed. No pertinent family history.  Social History: Social History   Tobacco Use  . Smoking status: Never Smoker  . Smokeless tobacco: Never Used  Substance Use Topics  . Alcohol use: Not Currently    Comment: occassional  . Drug use: No    Allergies: No Known Allergies  Meds:  No medications prior to admission.    ROS:  Review of Systems  Constitutional: Negative for chills, fatigue and fever.  Eyes: Negative for visual disturbance.  Respiratory: Negative for shortness of breath.   Cardiovascular: Negative for chest pain.  Gastrointestinal: Positive for abdominal pain. Negative for nausea and vomiting.  Genitourinary: Negative for difficulty urinating, dysuria, flank pain, pelvic pain,  vaginal bleeding, vaginal discharge and vaginal pain.  Musculoskeletal: Positive for back pain.  Neurological: Positive for headaches. Negative for dizziness.  Psychiatric/Behavioral: Negative.      I have reviewed patient's Past Medical Hx, Surgical Hx, Family Hx, Social Hx, medications and allergies.   Physical Exam   Patient Vitals for the past 24 hrs:  BP Temp Pulse Resp SpO2 Height Weight  04/20/18 2056 (!) 155/94 - 84 - - - -  04/20/18 2015 (!) 155/94 - 84 - - - -  04/20/18 2001 (!) 152/94 - 84 - - - -  04/20/18 1945 (!) 151/96 - 90 - - - -  04/20/18 1930 (!) 149/102 - 93 - - - -  04/20/18 1915 (!) 158/101 - 92 - - - -  04/20/18 1900 (!) 155/98 - 83 - - - -  04/20/18 1845 (!) 149/101 - 87 - - - -  04/20/18 1831 (!) 161/96 - 78 - - - -  04/20/18 1829 (!) 162/99 - 82 - - - -  04/20/18 1815 (!) 150/103 - 80 - - - -  04/20/18 1807 (!) 157/102 - 73 - - - -  04/20/18 1745 (!) 166/105 - 80 - - - -  04/20/18 1731 (!) 158/95 - 80 - - - -  04/20/18 1716 (!) 166/97 - 84 - - - -  04/20/18 1700 (!) 160/100 - 79 - - - -  04/20/18 1645 Marland Kitchen)  145/98 - 91 - - - -  04/20/18 1631 (!) 159/99 - 83 - - - -  04/20/18 1627 (!) 152/103 - 87 - - - -  04/20/18 1546 (!) 161/105 - 81 - - - -  04/20/18 1530 (!) 153/95 - 80 - - - -  04/20/18 1516 (!) 151/90 - 83 - - - -  04/20/18 1500 (!) 147/92 - 88 - - - -  04/20/18 1446 (!) 136/95 - 88 - - - -  04/20/18 1441 (!) 151/98 - 88 - - - -  04/20/18 1420 (!) 169/105 98 F (36.7 C) 90 16 100 % 5\' 6"  (1.676 m) 104.3 kg   Constitutional: Well-developed, well-nourished female in no acute distress.  HEART: normal rate, heart sounds, regular rhythm RESP: normal effort, lung sounds clear and equal bilaterally GI: Abd soft, non-tender, gravid appropriate for gestational age.  MS: Extremities nontender, no edema, normal ROM Neurologic: Alert and oriented x 4.  GU: Neg CVAT.  Dilation: Closed Effacement (%): Thick Cervical Position: Posterior Exam by::  Arbie Cookey, CNM   FHT:  Baseline 140 , moderate variability, accelerations present, no decelerations Contractions: q 3-15 mins, mild to palpation   Labs: Results for orders placed or performed during the hospital encounter of 04/20/18 (from the past 24 hour(s))  Urinalysis, Routine w reflex microscopic     Status: Abnormal   Collection Time: 04/20/18  2:20 PM  Result Value Ref Range   Color, Urine YELLOW YELLOW   APPearance CLEAR CLEAR   Specific Gravity, Urine 1.009 1.005 - 1.030   pH 7.0 5.0 - 8.0   Glucose, UA 50 (A) NEGATIVE mg/dL   Hgb urine dipstick NEGATIVE NEGATIVE   Bilirubin Urine NEGATIVE NEGATIVE   Ketones, ur NEGATIVE NEGATIVE mg/dL   Protein, ur NEGATIVE NEGATIVE mg/dL   Nitrite NEGATIVE NEGATIVE   Leukocytes, UA NEGATIVE NEGATIVE  Protein / creatinine ratio, urine     Status: Abnormal   Collection Time: 04/20/18  2:20 PM  Result Value Ref Range   Creatinine, Urine 84.00 mg/dL   Total Protein, Urine 16 mg/dL   Protein Creatinine Ratio 0.19 (H) 0.00 - 0.15 mg/mg[Cre]  CBC     Status: Abnormal   Collection Time: 04/20/18  3:09 PM  Result Value Ref Range   WBC 7.0 4.0 - 10.5 K/uL   RBC 3.93 3.87 - 5.11 MIL/uL   Hemoglobin 12.0 12.0 - 15.0 g/dL   HCT 96.2 (L) 95.2 - 84.1 %   MCV 91.1 80.0 - 100.0 fL   MCH 30.5 26.0 - 34.0 pg   MCHC 33.5 30.0 - 36.0 g/dL   RDW 32.4 40.1 - 02.7 %   Platelets 215 150 - 400 K/uL   nRBC 0.0 0.0 - 0.2 %  Comprehensive metabolic panel     Status: Abnormal   Collection Time: 04/20/18  3:09 PM  Result Value Ref Range   Sodium 134 (L) 135 - 145 mmol/L   Potassium 3.8 3.5 - 5.1 mmol/L   Chloride 106 98 - 111 mmol/L   CO2 19 (L) 22 - 32 mmol/L   Glucose, Bld 170 (H) 70 - 99 mg/dL   BUN 5 (L) 6 - 20 mg/dL   Creatinine, Ser 2.53 0.44 - 1.00 mg/dL   Calcium 8.6 (L) 8.9 - 10.3 mg/dL   Total Protein 6.7 6.5 - 8.1 g/dL   Albumin 3.1 (L) 3.5 - 5.0 g/dL   AST 24 15 - 41 U/L   ALT 17 0 - 44 U/L  Alkaline Phosphatase 106 38 - 126  U/L   Total Bilirubin 0.5 0.3 - 1.2 mg/dL   GFR calc non Af Amer >60 >60 mL/min   GFR calc Af Amer >60 >60 mL/min   Anion gap 9 5 - 15  Wet prep, genital     Status: Abnormal   Collection Time: 04/20/18  6:44 PM  Result Value Ref Range   Yeast Wet Prep HPF POC NONE SEEN NONE SEEN   Trich, Wet Prep NONE SEEN NONE SEEN   Clue Cells Wet Prep HPF POC NONE SEEN NONE SEEN   WBC, Wet Prep HPF POC MANY (A) NONE SEEN   Sperm NONE SEEN    O/Positive/-- (05/23 1005)  Imaging:    MAU Course/MDM: NST reviewed and reactive Pt h/a completely resolved with Fioricet x 2 in MAU Pt BP 150s/90s after labetalol doses, no hydralazine given Consult Dr Despina HiddenEure with presentation, exam findings and test results.  CHTN without evidence of PEC D/C home, BP check in 2 days in office Increase labetalol to 400 TID  Pt discharge with strict preeclampsia precautions.   Assessment: 1. Maternal chronic hypertension in third trimester     Plan: Discharge home Labor precautions and fetal kick counts Follow-up Information    [facility]   Specialty:  Obstetrics and Gynecology Why:  On Friday, 04/22/18 for blood pressure check.  Contact information: [address] [place] [id number] [phone]           Sharen CounterLisa Leftwich-Kirby Certified Nurse-Midwife 04/20/2018 11:30 PM

## 2018-04-20 NOTE — MAU Note (Signed)
Pt states she has been taking her BP at home and it was elevated so she called her physician and was told to come in a be evaluated

## 2018-04-20 NOTE — Telephone Encounter (Signed)
Patient called and left message on nurse voicemail line requesting results. Per chart review, patient is currently in MAU having labs repeated.

## 2018-04-22 ENCOUNTER — Ambulatory Visit: Payer: Self-pay

## 2018-04-25 ENCOUNTER — Ambulatory Visit (HOSPITAL_COMMUNITY)
Admission: RE | Admit: 2018-04-25 | Discharge: 2018-04-25 | Disposition: A | Discharge status: Home / Self Care | Payer: Medicaid Other | Source: Ambulatory Visit | Attending: Obstetrics & Gynecology | Admitting: Obstetrics & Gynecology

## 2018-04-25 ENCOUNTER — Inpatient Hospital Stay (HOSPITAL_COMMUNITY)
Admission: AD | Admit: 2018-04-25 | Discharge: 2018-04-25 | Disposition: A | Discharge status: Home / Self Care | Payer: Medicaid Other | Source: Ambulatory Visit | Attending: Obstetrics & Gynecology | Admitting: Obstetrics & Gynecology

## 2018-04-25 ENCOUNTER — Encounter (HOSPITAL_COMMUNITY): Payer: Self-pay | Admitting: *Deleted

## 2018-04-25 ENCOUNTER — Encounter (HOSPITAL_COMMUNITY): Payer: Self-pay

## 2018-04-25 ENCOUNTER — Ambulatory Visit (INDEPENDENT_AMBULATORY_CARE_PROVIDER_SITE_OTHER): Payer: Medicaid Other | Admitting: Obstetrics & Gynecology

## 2018-04-25 ENCOUNTER — Ambulatory Visit: Payer: Medicaid Other | Admitting: *Deleted

## 2018-04-25 VITALS — BP 160/100 | HR 84 | Wt 233.0 lb

## 2018-04-25 DIAGNOSIS — Z3A35 35 weeks gestation of pregnancy: Secondary | ICD-10-CM | POA: Diagnosis not present

## 2018-04-25 DIAGNOSIS — O10913 Unspecified pre-existing hypertension complicating pregnancy, third trimester: Secondary | ICD-10-CM

## 2018-04-25 DIAGNOSIS — Z3689 Encounter for other specified antenatal screening: Secondary | ICD-10-CM

## 2018-04-25 DIAGNOSIS — O24113 Pre-existing diabetes mellitus, type 2, in pregnancy, third trimester: Secondary | ICD-10-CM | POA: Insufficient documentation

## 2018-04-25 DIAGNOSIS — O0990 Supervision of high risk pregnancy, unspecified, unspecified trimester: Secondary | ICD-10-CM | POA: Diagnosis not present

## 2018-04-25 DIAGNOSIS — R51 Headache: Secondary | ICD-10-CM | POA: Diagnosis present

## 2018-04-25 DIAGNOSIS — O3660X0 Maternal care for excessive fetal growth, unspecified trimester, not applicable or unspecified: Secondary | ICD-10-CM | POA: Diagnosis present

## 2018-04-25 DIAGNOSIS — IMO0001 Reserved for inherently not codable concepts without codable children: Secondary | ICD-10-CM

## 2018-04-25 DIAGNOSIS — O24913 Unspecified diabetes mellitus in pregnancy, third trimester: Secondary | ICD-10-CM

## 2018-04-25 DIAGNOSIS — O99210 Obesity complicating pregnancy, unspecified trimester: Secondary | ICD-10-CM | POA: Diagnosis not present

## 2018-04-25 DIAGNOSIS — Z794 Long term (current) use of insulin: Secondary | ICD-10-CM | POA: Diagnosis not present

## 2018-04-25 DIAGNOSIS — O10919 Unspecified pre-existing hypertension complicating pregnancy, unspecified trimester: Secondary | ICD-10-CM

## 2018-04-25 DIAGNOSIS — I10 Essential (primary) hypertension: Secondary | ICD-10-CM | POA: Diagnosis present

## 2018-04-25 DIAGNOSIS — O0993 Supervision of high risk pregnancy, unspecified, third trimester: Secondary | ICD-10-CM

## 2018-04-25 DIAGNOSIS — Z7982 Long term (current) use of aspirin: Secondary | ICD-10-CM | POA: Insufficient documentation

## 2018-04-25 DIAGNOSIS — E119 Type 2 diabetes mellitus without complications: Secondary | ICD-10-CM | POA: Diagnosis not present

## 2018-04-25 DIAGNOSIS — O24313 Unspecified pre-existing diabetes mellitus in pregnancy, third trimester: Secondary | ICD-10-CM

## 2018-04-25 LAB — COMPREHENSIVE METABOLIC PANEL
ALBUMIN: 3.2 g/dL — AB (ref 3.5–5.0)
ALT: 18 U/L (ref 0–44)
AST: 26 U/L (ref 15–41)
Alkaline Phosphatase: 123 U/L (ref 38–126)
Anion gap: 8 (ref 5–15)
BILIRUBIN TOTAL: 0.9 mg/dL (ref 0.3–1.2)
BUN: 9 mg/dL (ref 6–20)
CALCIUM: 8.9 mg/dL (ref 8.9–10.3)
CHLORIDE: 109 mmol/L (ref 98–111)
CO2: 19 mmol/L — AB (ref 22–32)
CREATININE: 0.81 mg/dL (ref 0.44–1.00)
GFR calc non Af Amer: >60 mL/min (ref >60)
Glucose, Bld: 98 mg/dL (ref 70–99)
Potassium: 4 mmol/L (ref 3.5–5.1)
SODIUM: 136 mmol/L (ref 135–145)
Total Protein: 6.4 g/dL — ABNORMAL LOW (ref 6.5–8.1)

## 2018-04-25 LAB — CBC
HCT: 36 % (ref 36.0–46.0)
Hemoglobin: 12 g/dL (ref 12.0–15.0)
MCH: 30.5 pg (ref 26.0–34.0)
MCHC: 33.3 g/dL (ref 30.0–36.0)
MCV: 91.6 fL (ref 80.0–100.0)
Platelets: 209 10*3/uL (ref 150–400)
RBC: 3.93 MIL/uL (ref 3.87–5.11)
RDW: 13.7 % (ref 11.5–15.5)
WBC: 7.7 10*3/uL (ref 4.0–10.5)
nRBC: 0 % (ref 0.0–0.2)

## 2018-04-25 LAB — PROTEIN / CREATININE RATIO, URINE: CREATININE, URINE: 51 mg/dL

## 2018-04-25 MED ORDER — LABETALOL HCL 5 MG/ML IV SOLN: 40.0000 mg | INTRAVENOUS | Status: DC | PRN

## 2018-04-25 MED ORDER — HYDRALAZINE HCL 20 MG/ML IJ SOLN
5.0000 mg | INTRAMUSCULAR | Status: DC | PRN
  Administered 2018-04-25: 5 mg via INTRAVENOUS
  Filled 2018-04-25: qty 1

## 2018-04-25 MED ORDER — HYDRALAZINE HCL 20 MG/ML IJ SOLN: 10.0000 mg | INTRAMUSCULAR | Status: DC | PRN

## 2018-04-25 MED ORDER — LABETALOL HCL 200 MG PO TABS
400.0000 mg | ORAL_TABLET | Freq: Three times a day (TID) | ORAL | Status: DC
  Administered 2018-04-25: 400 mg via ORAL
  Filled 2018-04-25 (×3): qty 2

## 2018-04-25 MED ORDER — LABETALOL HCL 5 MG/ML IV SOLN: 20.0000 mg | INTRAVENOUS | Status: DC | PRN

## 2018-04-25 MED ORDER — ACETAMINOPHEN 500 MG PO TABS: 1000.0000 mg | ORAL_TABLET | Freq: Four times a day (QID) | ORAL | Status: DC | PRN

## 2018-04-25 MED ORDER — LABETALOL HCL 200 MG PO TABS: 600.0000 mg | ORAL_TABLET | Freq: Three times a day (TID) | ORAL | 0 refills | Status: DC

## 2018-04-25 NOTE — Progress Notes (Signed)
US for growth and BPP done @ [facility] today. Pt states she has a H/A - pain scale 5. Pt denies visual disturbances or RUQ pain. Pt states she is having a new type of pelvic pain since yesterday.

## 2018-04-25 NOTE — MAU Note (Signed)
Pt sent up from clinic with elevated bp, headache.

## 2018-04-25 NOTE — MAU Provider Note (Signed)
History     CSN: [id number]  Arrival date and time: 04/25/18 1543   Chief Complaint  Patient presents with  . Headache  . Hypertension   G3P2002 @35 .5 wks sent from clinic for severe range BPs. Reports onset of frontal HA since arrival. Rates 3/10. Denies visual disturbances and RUQ pain. Reports good FM. No VB, LOF, ctx. Pt is taking Labetalol 400 mg TID and had 1 dose today.   OB History    Gravida  3   Para  2   Term  2   Preterm      AB  0   Living  2     SAB      TAB      Ectopic      Multiple      Live Births  2           Past Medical History:  Diagnosis Date  . Diabetes mellitus without complication (HCC)   . Hypertension     Past Surgical History:  Procedure Laterality Date  . NO PAST SURGERIES      History reviewed. No pertinent family history.  Social History   Tobacco Use  . Smoking status: Never Smoker  . Smokeless tobacco: Never Used  Substance Use Topics  . Alcohol use: Not Currently    Comment: occassional  . Drug use: No    Allergies: No Known Allergies  Medications Prior to Admission  Medication Sig Dispense Refill Last Dose  . ACCU-CHEK FASTCLIX LANCETS MISC 1 Device by Percutaneous route 4 (four) times daily. 100 each 12 Taking  . acetaminophen (TYLENOL) 500 MG tablet Take 1,000 mg by mouth every 6 (six) hours as needed for mild pain.   Not Taking  . aspirin EC 81 MG tablet Take 1 tablet (81 mg total) by mouth daily. 60 tablet 2 Taking  . folic acid (FOLVITE) 1 MG tablet Take 1 tablet (1 mg total) by mouth daily. 60 tablet 3 Taking  . glucose blood test strip Use as instructed 100 each 12 Taking  . insulin aspart (NOVOLOG) 100 UNIT/ML injection 30 units with breakfast; 20 units with lunch and 30 units with dinner (Patient not taking: Reported on 04/25/2018) 10 mL 12 Not Taking  . insulin NPH Human (HUMULIN N,NOVOLIN N) 100 UNIT/ML injection 46 units in the morning and 46 units at bedtime 10 mL 5 Taking  . Insulin  Syringe-Needle U-100 (INSULIN SYRINGE 1CC/31GX5/16") 31G X 5/16" 1 ML MISC 1 Units by Does not apply route as directed. 100 each 3 Taking  . metFORMIN (GLUCOPHAGE) 500 MG tablet Take 1 tablet (500 mg total) by mouth 2 (two) times daily with a meal. (Patient not taking: Reported on 04/25/2018) 60 tablet 5 Not Taking  . Prenatal Vit-Fe Fumarate-FA (MULTIVITAMIN-PRENATAL) 27-0.8 MG TABS tablet Take 1 tablet by mouth daily at 12 noon. 180 each 3 Taking  . [DISCONTINUED] labetalol (NORMODYNE) 200 MG tablet Take 2 tablets (400 mg total) by mouth 3 (three) times daily. 180 tablet 0 Taking    Review of Systems  Eyes: Negative for visual disturbance.  Gastrointestinal: Negative for abdominal pain.  Neurological: Positive for headaches.   Physical Exam   Blood pressure 128/85, pulse 88, last menstrual period 08/08/2017. Patient Vitals for the past 24 hrs:  BP Pulse  04/25/18 1846 128/85 88  04/25/18 1832 132/80 94  04/25/18 1817 132/84 93  04/25/18 1801 (!) 141/89 87  04/25/18 1746 132/90 76  04/25/18 1732 (!) 131/92 84  04/25/18 1717 (!)  156/97 74  04/25/18 1714 (!) 172/105 83  04/25/18 1702 (!) 155/98 80  04/25/18 1647 (!) 168/102 79  04/25/18 1634 (!) 199/109 91    Physical Exam  Constitutional: She appears well-developed and well-nourished. No distress.  HENT:  Head: Normocephalic and atraumatic.  Neck: Normal range of motion.  Respiratory: Effort normal. No respiratory distress.  Musculoskeletal: Normal range of motion.  Psychiatric: She has a normal mood and affect.  EFM: 130 bpm, mod variability, + accels, no decels Toco: irregular  Results for orders placed or performed during the hospital encounter of 04/25/18 (from the past 24 hour(s))  CBC     Status: None   Collection Time: 04/25/18  4:06 PM  Result Value Ref Range   WBC 7.7 4.0 - 10.5 K/uL   RBC 3.93 3.87 - 5.11 MIL/uL   Hemoglobin 12.0 12.0 - 15.0 g/dL   HCT 16.136.0 09.636.0 - 04.546.0 %   MCV 91.6 80.0 - 100.0 fL   MCH 30.5  26.0 - 34.0 pg   MCHC 33.3 30.0 - 36.0 g/dL   RDW 40.913.7 81.111.5 - 91.415.5 %   Platelets 209 150 - 400 K/uL   nRBC 0.0 0.0 - 0.2 %  Comprehensive metabolic panel     Status: Abnormal   Collection Time: 04/25/18  4:06 PM  Result Value Ref Range   Sodium 136 135 - 145 mmol/L   Potassium 4.0 3.5 - 5.1 mmol/L   Chloride 109 98 - 111 mmol/L   CO2 19 (L) 22 - 32 mmol/L   Glucose, Bld 98 70 - 99 mg/dL   BUN 9 6 - 20 mg/dL   Creatinine, Ser 7.820.81 0.44 - 1.00 mg/dL   Calcium 8.9 8.9 - 95.610.3 mg/dL   Total Protein 6.4 (L) 6.5 - 8.1 g/dL   Albumin 3.2 (L) 3.5 - 5.0 g/dL   AST 26 15 - 41 U/L   ALT 18 0 - 44 U/L   Alkaline Phosphatase 123 38 - 126 U/L   Total Bilirubin 0.9 0.3 - 1.2 mg/dL   GFR calc non Af Amer >60 >60 mL/min   GFR calc Af Amer >60 >60 mL/min   Anion gap 8 5 - 15  Protein / creatinine ratio, urine     Status: None   Collection Time: 04/25/18  4:23 PM  Result Value Ref Range   Creatinine, Urine 51.00 mg/dL   Total Protein, Urine <6 mg/dL   Protein Creatinine Ratio        0.00 - 0.15 mg/mg[Cre]   MAU Course  Procedures Hydralazine 5mg   MDM Labs ordered and reviewed. Declines Tylenol for HA, now reports no HA. No evidence of si PEC. Consult with Dr. Vergie LivingPickens, recommends increase in Labetalol to 600 mg TID.   Assessment and Plan  [redacted] weeks gestation Reactive NST Chronic HTN in pregnancy Discharge home Follow up in [facility] in 2 days for BP check PEC precautions  Allergies as of 04/25/2018   No Known Allergies     Medication List    TAKE these medications   ACCU-CHEK FASTCLIX LANCETS Misc 1 Device by Percutaneous route 4 (four) times daily.   acetaminophen 500 MG tablet Commonly known as:  TYLENOL Take 1,000 mg by mouth every 6 (six) hours as needed for mild pain.   aspirin EC 81 MG tablet Take 1 tablet (81 mg total) by mouth daily.   folic acid 1 MG tablet Commonly known as:  FOLVITE Take 1 tablet (1 mg total) by mouth daily.  glucose blood test strip Use as  instructed   insulin aspart 100 UNIT/ML injection Commonly known as:  novoLOG 30 units with breakfast; 20 units with lunch and 30 units with dinner   insulin NPH Human 100 UNIT/ML injection Commonly known as:  HUMULIN N,NOVOLIN N 46 units in the morning and 46 units at bedtime   INSULIN SYRINGE 1CC/31GX5/16" 31G X 5/16" 1 ML Misc 1 Units by Does not apply route as directed.   labetalol 200 MG tablet Commonly known as:  NORMODYNE Take 3 tablets (600 mg total) by mouth 3 (three) times daily. What changed:  how much to take   metFORMIN 500 MG tablet Commonly known as:  GLUCOPHAGE Take 1 tablet (500 mg total) by mouth 2 (two) times daily with a meal.   multivitamin-prenatal 27-0.8 MG Tabs tablet Take 1 tablet by mouth daily at 12 noon.      Donette Larry, CNM 04/25/2018, 7:09 PM

## 2018-04-25 NOTE — Patient Instructions (Signed)
Preeclampsia and Eclampsia °Preeclampsia is a serious condition that develops only during pregnancy. It is also called toxemia of pregnancy. This condition causes high blood pressure along with other symptoms, such as swelling and headaches. These symptoms may develop as the condition gets worse. Preeclampsia may occur at 20 weeks of pregnancy or later. °Diagnosing and treating preeclampsia early is very important. If not treated early, it can cause serious problems for you and your baby. One problem it can lead to is eclampsia, which is a condition that causes muscle jerking or shaking (convulsions or seizures) in the mother. Delivering your baby is the best treatment for preeclampsia or eclampsia. Preeclampsia and eclampsia symptoms usually go away after your baby is born. °What are the causes? °The cause of preeclampsia is not known. °What increases the risk? °The following risk factors make you more likely to develop preeclampsia: °· Being pregnant for the first time. °· Having had preeclampsia during a past pregnancy. °· Having a family history of preeclampsia. °· Having high blood pressure. °· Being pregnant with twins or triplets. °· Being 35 or older. °· Being African-American. °· Having kidney disease or diabetes. °· Having medical conditions such as lupus or blood diseases. °· Being very overweight (obese). ° °What are the signs or symptoms? °The earliest signs of preeclampsia are: °· High blood pressure. °· Increased protein in your urine. Your health care provider will check for this at every visit before you give birth (prenatal visit). ° °Other symptoms that may develop as the condition gets worse include: °· Severe headaches. °· Sudden weight gain. °· Swelling of the hands, face, legs, and feet. °· Nausea and vomiting. °· Vision problems, such as blurred or double vision. °· Numbness in the face, arms, legs, and feet. °· Urinating less than usual. °· Dizziness. °· Slurred speech. °· Abdominal pain,  especially upper abdominal pain. °· Convulsions or seizures. ° °Symptoms generally go away after giving birth. °How is this diagnosed? °There are no screening tests for preeclampsia. Your health care provider will ask you about symptoms and check for signs of preeclampsia during your prenatal visits. You may also have tests that include: °· Urine tests. °· Blood tests. °· Checking your blood pressure. °· Monitoring your baby’s heart rate. °· Ultrasound. ° °How is this treated? °You and your health care provider will determine the treatment approach that is best for you. Treatment may include: °· Having more frequent prenatal exams to check for signs of preeclampsia, if you have an increased risk for preeclampsia. °· Bed rest. °· Reducing how much salt (sodium) you eat. °· Medicine to lower your blood pressure. °· Staying in the hospital, if your condition is severe. There, treatment will focus on controlling your blood pressure and the amount of fluids in your body (fluid retention). °· You may need to take medicine (magnesium sulfate) to prevent seizures. This medicine may be given as an injection or through an IV tube. °· Delivering your baby early, if your condition gets worse. You may have your labor started with medicine (induced), or you may have a cesarean delivery. ° °Follow these instructions at home: °Eating and drinking ° °· Drink enough fluid to keep your urine clear or pale yellow. °· Eat a healthy diet that is low in sodium. Do not add salt to your food. Check nutrition labels to see how much sodium a food or beverage contains. °· Avoid caffeine. °Lifestyle °· Do not use any products that contain nicotine or tobacco, such as cigarettes   and e-cigarettes. If you need help quitting, ask your health care provider. °· Do not use alcohol or drugs. °· Avoid stress as much as possible. Rest and get plenty of sleep. °General instructions °· Take over-the-counter and prescription medicines only as told by your  health care provider. °· When lying down, lie on your side. This keeps pressure off of your baby. °· When sitting or lying down, raise (elevate) your feet. Try putting some pillows underneath your lower legs. °· Exercise regularly. Ask your health care provider what kinds of exercise are best for you. °· Keep all follow-up and prenatal visits as told by your health care provider. This is important. °How is this prevented? °To prevent preeclampsia or eclampsia from developing during another pregnancy: °· Get proper medical care during pregnancy. Your health care provider may be able to prevent preeclampsia or diagnose and treat it early. °· Your health care provider may have you take a low-dose aspirin or a calcium supplement during your next pregnancy. °· You may have tests of your blood pressure and kidney function after giving birth. °· Maintain a healthy weight. Ask your health care provider for help managing weight gain during pregnancy. °· Work with your health care provider to manage any long-term (chronic) health conditions you have, such as diabetes or kidney problems. ° °Contact a health care provider if: °· You gain more weight than expected. °· You have headaches. °· You have nausea or vomiting. °· You have abdominal pain. °· You feel dizzy or light-headed. °Get help right away if: °· You develop sudden or severe swelling anywhere in your body. This usually happens in the legs. °· You gain 5 lbs (2.3 kg) or more during one week. °· You have severe: °? Abdominal pain. °? Headaches. °? Dizziness. °? Vision problems. °? Confusion. °? Nausea or vomiting. °· You have a seizure. °· You have trouble moving any part of your body. °· You develop numbness in any part of your body. °· You have trouble speaking. °· You have any abnormal bleeding. °· You pass out. °This information is not intended to replace advice given to you by your health care provider. Make sure you discuss any questions you have with your health  care provider. °Document Released: 05/22/2000 Document Revised: 01/21/2016 Document Reviewed: 12/30/2015 °Elsevier Interactive Patient Education © 2018 [employer] ° °

## 2018-04-25 NOTE — Discharge Instructions (Signed)

## 2018-04-25 NOTE — Progress Notes (Signed)
   PRENATAL VISIT NOTE  Subjective:  Cheryl Curry is a 32 y.o. G3P2002 at 5470w5d being seen today for ongoing prenatal care.  She is currently monitored for the following issues for this high-risk pregnancy and has DM2 in pregnancy; Supervision of high risk pregnancy, antepartum; Medication exposure during first trimester of pregnancy; Chronic hypertension during pregnancy, antepartum; GBS bacteriuria; Obesity in pregnancy; and Fetal cardiac anomaly affecting pregnancy, antepartum on their problem list.  Patient reports headache.  Contractions: Irregular. Vag. Bleeding: None.  Movement: Present. Denies leaking of fluid.   The following portions of the patient's history were reviewed and updated as appropriate: allergies, current medications, past family history, past medical history, past social history, past surgical history and problem list. Problem list updated.  Objective:   Vitals:   04/25/18 1444  BP: (!) 160/100  Pulse: 84  Weight: 233 lb (105.7 kg)    Fetal Status: Fetal Heart Rate (bpm): NST   Movement: Present     General:  Alert, oriented and cooperative. Patient is in no acute distress.  Skin: Skin is warm and dry. No rash noted.   Cardiovascular: Normal heart rate noted  Respiratory: Normal respiratory effort, no problems with respiration noted  Abdomen: Soft, gravid, appropriate for gestational age.  Pain/Pressure: Present     Pelvic: Cervical exam deferred        Extremities: Normal range of motion.     Mental Status: Normal mood and affect. Normal behavior. Normal judgment and thought content.   Assessment and Plan:  Pregnancy: G3P2002 at 3470w5d  1. Supervision of high risk pregnancy, antepartum   2. Diabetes mellitus affecting pregnancy in third trimester Poor control we need to stress compliance US today may be developing macrosomia 3. Chronic hypertension during pregnancy, antepartum Elevated BP possible SIPEC, patient notes headache  Preterm  labor symptoms and general obstetric precautions including but not limited to vaginal bleeding, contractions, leaking of fluid and fetal movement were reviewed in detail with the patient. Please refer to After Visit Summary for other counseling recommendations.  Return in about 1 week (around 05/02/2018) for as scheduled.  Future Appointments  Date Time Provider Department Center  05/02/2018 10:15 AM [facility]  05/02/2018 11:15 AM Adam PhenixArnold, Sharlize Hoar G, MD [facility]  05/09/2018  1:15 PM [facility]ST [facility]  05/09/2018  2:15 PM Allie Bossierove, Myra C, MD [facility]  To MAU for evaluation [facility] CNM notified  Scheryl DarterJames Shaketha Jeon, MD

## 2018-04-26 ENCOUNTER — Telehealth: Payer: Self-pay

## 2018-04-26 NOTE — Telephone Encounter (Signed)
Pt called and stated that she was just discharged yesterday.  Pt states that she took her BP this am and it was 100's/100's.  Pt also stated that she just took her medication as well.  Unable to LM due to VM box full.  MyChart message sent.

## 2018-04-27 ENCOUNTER — Encounter (HOSPITAL_COMMUNITY): Payer: Self-pay | Admitting: *Deleted

## 2018-04-27 ENCOUNTER — Other Ambulatory Visit: Payer: Self-pay

## 2018-04-27 ENCOUNTER — Inpatient Hospital Stay (HOSPITAL_COMMUNITY)
Admission: AD | Admit: 2018-04-27 | Discharge: 2018-05-02 | DRG: 807 | Discharge status: Against Medical Advice | Payer: Medicaid Other | Attending: Obstetrics and Gynecology | Admitting: Obstetrics and Gynecology

## 2018-04-27 ENCOUNTER — Ambulatory Visit (INDEPENDENT_AMBULATORY_CARE_PROVIDER_SITE_OTHER): Payer: Medicaid Other | Admitting: *Deleted

## 2018-04-27 VITALS — BP 135/87 | HR 85 | Wt 236.8 lb

## 2018-04-27 DIAGNOSIS — E1165 Type 2 diabetes mellitus with hyperglycemia: Secondary | ICD-10-CM | POA: Diagnosis present

## 2018-04-27 DIAGNOSIS — O35BXX0 Maternal care for other (suspected) fetal abnormality and damage, fetal cardiac anomalies, not applicable or unspecified: Secondary | ICD-10-CM | POA: Diagnosis present

## 2018-04-27 DIAGNOSIS — O24919 Unspecified diabetes mellitus in pregnancy, unspecified trimester: Secondary | ICD-10-CM | POA: Diagnosis present

## 2018-04-27 DIAGNOSIS — O114 Pre-existing hypertension with pre-eclampsia, complicating childbirth: Principal | ICD-10-CM | POA: Diagnosis present

## 2018-04-27 DIAGNOSIS — O3663X0 Maternal care for excessive fetal growth, third trimester, not applicable or unspecified: Secondary | ICD-10-CM | POA: Diagnosis present

## 2018-04-27 DIAGNOSIS — O119 Pre-existing hypertension with pre-eclampsia, unspecified trimester: Secondary | ICD-10-CM | POA: Diagnosis present

## 2018-04-27 DIAGNOSIS — O99824 Streptococcus B carrier state complicating childbirth: Secondary | ICD-10-CM | POA: Diagnosis present

## 2018-04-27 DIAGNOSIS — O99214 Obesity complicating childbirth: Secondary | ICD-10-CM | POA: Diagnosis present

## 2018-04-27 DIAGNOSIS — O358XX0 Maternal care for other (suspected) fetal abnormality and damage, not applicable or unspecified: Secondary | ICD-10-CM | POA: Diagnosis present

## 2018-04-27 DIAGNOSIS — O1002 Pre-existing essential hypertension complicating childbirth: Secondary | ICD-10-CM | POA: Diagnosis present

## 2018-04-27 DIAGNOSIS — O2412 Pre-existing diabetes mellitus, type 2, in childbirth: Secondary | ICD-10-CM | POA: Diagnosis present

## 2018-04-27 DIAGNOSIS — Z3A36 36 weeks gestation of pregnancy: Secondary | ICD-10-CM | POA: Diagnosis not present

## 2018-04-27 DIAGNOSIS — Z794 Long term (current) use of insulin: Secondary | ICD-10-CM | POA: Diagnosis not present

## 2018-04-27 DIAGNOSIS — O10919 Unspecified pre-existing hypertension complicating pregnancy, unspecified trimester: Secondary | ICD-10-CM | POA: Diagnosis present

## 2018-04-27 DIAGNOSIS — O1414 Severe pre-eclampsia complicating childbirth: Secondary | ICD-10-CM | POA: Diagnosis not present

## 2018-04-27 DIAGNOSIS — O0990 Supervision of high risk pregnancy, unspecified, unspecified trimester: Secondary | ICD-10-CM

## 2018-04-27 DIAGNOSIS — O99210 Obesity complicating pregnancy, unspecified trimester: Secondary | ICD-10-CM

## 2018-04-27 DIAGNOSIS — R8271 Bacteriuria: Secondary | ICD-10-CM | POA: Diagnosis present

## 2018-04-27 DIAGNOSIS — O163 Unspecified maternal hypertension, third trimester: Secondary | ICD-10-CM

## 2018-04-27 HISTORY — DX: Gestational (pregnancy-induced) hypertension without significant proteinuria, unspecified trimester: O13.9

## 2018-04-27 LAB — PROTEIN / CREATININE RATIO, URINE: CREATININE, URINE: 24 mg/dL

## 2018-04-27 LAB — CBC
HCT: 35.3 % — ABNORMAL LOW (ref 36.0–46.0)
HEMOGLOBIN: 12 g/dL (ref 12.0–15.0)
MCH: 31.1 pg (ref 26.0–34.0)
MCHC: 34 g/dL (ref 30.0–36.0)
MCV: 91.5 fL (ref 80.0–100.0)
PLATELETS: 206 10*3/uL (ref 150–400)
RBC: 3.86 MIL/uL — ABNORMAL LOW (ref 3.87–5.11)
RDW: 13.9 % (ref 11.5–15.5)
WBC: 6.5 10*3/uL (ref 4.0–10.5)
nRBC: 0 % (ref 0.0–0.2)

## 2018-04-27 LAB — TYPE AND SCREEN
ABO/RH(D): O POS
ANTIBODY SCREEN: NEGATIVE

## 2018-04-27 LAB — COMPREHENSIVE METABOLIC PANEL
ALBUMIN: 2.9 g/dL — AB (ref 3.5–5.0)
ALK PHOS: 118 U/L (ref 38–126)
ALT: 19 U/L (ref 0–44)
ANION GAP: 9 (ref 5–15)
AST: 29 U/L (ref 15–41)
BILIRUBIN TOTAL: 0.5 mg/dL (ref 0.3–1.2)
BUN: 7 mg/dL (ref 6–20)
CALCIUM: 8.6 mg/dL — AB (ref 8.9–10.3)
CO2: 16 mmol/L — ABNORMAL LOW (ref 22–32)
Chloride: 108 mmol/L (ref 98–111)
Creatinine, Ser: 0.66 mg/dL (ref 0.44–1.00)
GLUCOSE: 204 mg/dL — AB (ref 70–99)
POTASSIUM: 4 mmol/L (ref 3.5–5.1)
Sodium: 133 mmol/L — ABNORMAL LOW (ref 135–145)
TOTAL PROTEIN: 6.1 g/dL — AB (ref 6.5–8.1)

## 2018-04-27 LAB — GLUCOSE, CAPILLARY
GLUCOSE-CAPILLARY: 75 mg/dL (ref 70–99)
Glucose-Capillary: 86 mg/dL (ref 70–99)
Glucose-Capillary: 186 mg/dL — ABNORMAL HIGH (ref 70–99)

## 2018-04-27 MED ORDER — LABETALOL HCL 5 MG/ML IV SOLN
40.0000 mg | INTRAVENOUS | Status: DC | PRN
  Administered 2018-04-27 – 2018-04-28 (×2): 40 mg via INTRAVENOUS
  Filled 2018-04-27 (×2): qty 8

## 2018-04-27 MED ORDER — LACTATED RINGERS IV SOLN
INTRAVENOUS | Status: DC
  Administered 2018-04-27: 15:00:00 via INTRAVENOUS
  Administered 2018-04-27: 125 mL/h via INTRAVENOUS

## 2018-04-27 MED ORDER — MAGNESIUM SULFATE 40 G IN LACTATED RINGERS - SIMPLE
2.0000 g/h | INTRAVENOUS | Status: AC
  Administered 2018-04-27 – 2018-04-29 (×3): 2 g/h via INTRAVENOUS
  Filled 2018-04-27 (×3): qty 500

## 2018-04-27 MED ORDER — LABETALOL HCL 200 MG PO TABS
600.0000 mg | ORAL_TABLET | Freq: Three times a day (TID) | ORAL | Status: DC
  Administered 2018-04-27 – 2018-04-29 (×6): 600 mg via ORAL
  Filled 2018-04-27 (×6): qty 3

## 2018-04-27 MED ORDER — INSULIN ASPART 100 UNIT/ML ~~LOC~~ SOLN: 0.0000 [IU] | Freq: Three times a day (TID) | SUBCUTANEOUS | Status: DC

## 2018-04-27 MED ORDER — LIDOCAINE HCL (PF) 1 % IJ SOLN
30.0000 mL | INTRAMUSCULAR | Status: DC | PRN
  Filled 2018-04-27: qty 30

## 2018-04-27 MED ORDER — HYDRALAZINE HCL 20 MG/ML IJ SOLN: 10.0000 mg | INTRAMUSCULAR | Status: DC | PRN

## 2018-04-27 MED ORDER — ZOLPIDEM TARTRATE 5 MG PO TABS
5.0000 mg | ORAL_TABLET | Freq: Every evening | ORAL | Status: DC | PRN
  Administered 2018-04-27: 5 mg via ORAL
  Filled 2018-04-27: qty 1

## 2018-04-27 MED ORDER — MISOPROSTOL 50MCG HALF TABLET
50.0000 ug | ORAL_TABLET | ORAL | Status: DC | PRN
  Administered 2018-04-27 – 2018-04-28 (×3): 50 ug via BUCCAL
  Filled 2018-04-27 (×6): qty 1

## 2018-04-27 MED ORDER — ACETAMINOPHEN 500 MG PO TABS
1000.0000 mg | ORAL_TABLET | Freq: Once | ORAL | Status: AC
  Administered 2018-04-27: 1000 mg via ORAL
  Filled 2018-04-27: qty 2

## 2018-04-27 MED ORDER — FENTANYL CITRATE (PF) 100 MCG/2ML IJ SOLN
100.0000 ug | INTRAMUSCULAR | Status: DC | PRN
  Administered 2018-04-28 – 2018-04-29 (×4): 100 ug via INTRAVENOUS
  Filled 2018-04-27 (×5): qty 2

## 2018-04-27 MED ORDER — INSULIN ASPART 100 UNIT/ML ~~LOC~~ SOLN
0.0000 [IU] | SUBCUTANEOUS | Status: DC | PRN
  Administered 2018-04-27 – 2018-04-28 (×3): 3 [IU] via SUBCUTANEOUS
  Administered 2018-04-28: 1 [IU] via SUBCUTANEOUS
  Administered 2018-04-28: 2 [IU] via SUBCUTANEOUS
  Administered 2018-04-28: 1 [IU] via SUBCUTANEOUS
  Administered 2018-04-29 (×2): 3 [IU] via SUBCUTANEOUS
  Filled 2018-04-27 (×8): qty 0.14

## 2018-04-27 MED ORDER — MAGNESIUM SULFATE BOLUS VIA INFUSION
4.0000 g | Freq: Once | INTRAVENOUS | Status: AC
  Administered 2018-04-27: 4 g via INTRAVENOUS
  Filled 2018-04-27: qty 500

## 2018-04-27 MED ORDER — OXYTOCIN BOLUS FROM INFUSION
500.0000 mL | Freq: Once | INTRAVENOUS | Status: AC
  Administered 2018-04-29: 500 mL via INTRAVENOUS

## 2018-04-27 MED ORDER — LACTATED RINGERS IV SOLN
500.0000 mL | INTRAVENOUS | Status: DC | PRN
  Administered 2018-04-28 – 2018-04-29 (×2): 500 mL via INTRAVENOUS

## 2018-04-27 MED ORDER — SODIUM CHLORIDE 0.9 % IV SOLN
5.0000 10*6.[IU] | Freq: Once | INTRAVENOUS | Status: AC
  Administered 2018-04-27: 5 10*6.[IU] via INTRAVENOUS
  Filled 2018-04-27: qty 5

## 2018-04-27 MED ORDER — BETAMETHASONE SOD PHOS & ACET 6 (3-3) MG/ML IJ SUSP
12.0000 mg | INTRAMUSCULAR | Status: AC
  Administered 2018-04-27 – 2018-04-28 (×2): 12 mg via INTRAMUSCULAR
  Filled 2018-04-27 (×2): qty 2

## 2018-04-27 MED ORDER — FLEET ENEMA 7-19 GM/118ML RE ENEM: 1.0000 | ENEMA | RECTAL | Status: DC | PRN

## 2018-04-27 MED ORDER — INSULIN ASPART 100 UNIT/ML ~~LOC~~ SOLN: 20.0000 [IU] | Freq: Three times a day (TID) | SUBCUTANEOUS | Status: DC

## 2018-04-27 MED ORDER — HYDRALAZINE HCL 20 MG/ML IJ SOLN
5.0000 mg | INTRAMUSCULAR | Status: DC | PRN
  Administered 2018-04-28: 5 mg via INTRAVENOUS
  Filled 2018-04-27: qty 1

## 2018-04-27 MED ORDER — LABETALOL HCL 5 MG/ML IV SOLN
20.0000 mg | INTRAVENOUS | Status: DC | PRN
  Administered 2018-04-27 – 2018-04-28 (×2): 20 mg via INTRAVENOUS
  Filled 2018-04-27 (×3): qty 4

## 2018-04-27 MED ORDER — INSULIN REGULAR(HUMAN) IN NACL 100-0.9 UT/100ML-% IV SOLN
INTRAVENOUS | Status: DC
  Filled 2018-04-27: qty 100

## 2018-04-27 MED ORDER — SOD CITRATE-CITRIC ACID 500-334 MG/5ML PO SOLN: 30.0000 mL | ORAL | Status: DC | PRN

## 2018-04-27 MED ORDER — TERBUTALINE SULFATE 1 MG/ML IJ SOLN: 0.2500 mg | Freq: Once | INTRAMUSCULAR | Status: DC | PRN

## 2018-04-27 MED ORDER — LACTATED RINGERS IV SOLN
INTRAVENOUS | Status: DC
  Administered 2018-04-27 – 2018-04-29 (×4): via INTRAVENOUS

## 2018-04-27 MED ORDER — OXYTOCIN 40 UNITS IN LACTATED RINGERS INFUSION - SIMPLE MED: 2.5000 [IU]/h | INTRAVENOUS | Status: DC

## 2018-04-27 MED ORDER — PENICILLIN G 3 MILLION UNITS IVPB - SIMPLE MED
3.0000 10*6.[IU] | INTRAVENOUS | Status: DC
  Administered 2018-04-27 – 2018-04-29 (×10): 3 10*6.[IU] via INTRAVENOUS
  Filled 2018-04-27 (×9): qty 100

## 2018-04-27 MED ORDER — DEXTROSE-NACL 5-0.45 % IV SOLN: INTRAVENOUS | Status: DC

## 2018-04-27 MED ORDER — ONDANSETRON HCL 4 MG/2ML IJ SOLN: 4.0000 mg | Freq: Four times a day (QID) | INTRAMUSCULAR | Status: DC | PRN

## 2018-04-27 NOTE — H&P (Signed)
LABOR AND DELIVERY ADMISSION HISTORY AND PHYSICAL NOTE  Cheryl Curry is a 32 y.o. female G42P2002 with IUP at [redacted]w[redacted]d by L/6 s presenting for IOL for superimposed severe preeclampsia by blood pressures and headaches.   She reports positive fetal movement. She denies leakage of fluid or vaginal bleeding. She is not feeling contractions at this time. She states she does not presently have a headache. She denies any vision changes, chest pain, SOB, or swelling.  Prenatal History/Complications: PNC at [facility] Pregnancy complications:  - chronic hypertension - elevated BPs for last few days up to 190-200s systolic  on ACE inhibitor prior to pregnancy, switched to labetalol  poorly controlled for last several weeks - GBS positive in urine - T2DM - A1c 8.9 most recently but 11.4 at beginning of pregnancy, insulin dependent (NPH 46U BID, Lispro 30U BID and metformin)  Past Medical History: Past Medical History:  Diagnosis Date  . Diabetes mellitus without complication (HCC)   . Hypertension   . Pregnancy induced hypertension     Past Surgical History: Past Surgical History:  Procedure Laterality Date  . NO PAST SURGERIES      Obstetrical History: OB History    Gravida  3   Para  2   Term  2   Preterm      AB  0   Living  2     SAB      TAB      Ectopic      Multiple      Live Births  2           Social History: Social History   Socioeconomic History  . Marital status: Married    Spouse name: Not on file  . Number of children: Not on file  . Years of education: Not on file  . Highest education level: Not on file  Occupational History  . Not on file  Social Needs  . Financial resource strain: Not on file  . Food insecurity:    Worry: Not on file    Inability: Not on file  . Transportation needs:    Medical: Not on file    Non-medical: Not on file  Tobacco Use  . Smoking status: Never Smoker  . Smokeless tobacco: Never Used  Substance and  Sexual Activity  . Alcohol use: Not Currently    Comment: occassional  . Drug use: No  . Sexual activity: Yes    Birth control/protection: None  Lifestyle  . Physical activity:    Days per week: Not on file    Minutes per session: Not on file  . Stress: Not on file  Relationships  . Social connections:    Talks on phone: Not on file    Gets together: Not on file    Attends religious service: Not on file    Active member of club or organization: Not on file    Attends meetings of clubs or organizations: Not on file    Relationship status: Not on file  Other Topics Concern  . Not on file  Social History Narrative   ** Merged History Encounter **        Family History: History reviewed. No pertinent family history.  Allergies: No Known Allergies  Medications Prior to Admission  Medication Sig Dispense Refill Last Dose  . ACCU-CHEK FASTCLIX LANCETS MISC 1 Device by Percutaneous route 4 (four) times daily. 100 each 12 Taking  . acetaminophen (TYLENOL) 500 MG tablet Take 1,000 mg by mouth  every 6 (six) hours as needed for mild pain.   Taking  . aspirin EC 81 MG tablet Take 1 tablet (81 mg total) by mouth daily. 60 tablet 2 Taking  . folic acid (FOLVITE) 1 MG tablet Take 1 tablet (1 mg total) by mouth daily. 60 tablet 3 Taking  . glucose blood test strip Use as instructed 100 each 12 Taking  . insulin aspart (NOVOLOG) 100 UNIT/ML injection 30 units with breakfast; 20 units with lunch and 30 units with dinner 10 mL 12 Taking  . insulin NPH Human (HUMULIN N,NOVOLIN N) 100 UNIT/ML injection 46 units in the morning and 46 units at bedtime 10 mL 5 Taking  . Insulin Syringe-Needle U-100 (INSULIN SYRINGE 1CC/31GX5/16") 31G X 5/16" 1 ML MISC 1 Units by Does not apply route as directed. 100 each 3 Taking  . labetalol (NORMODYNE) 200 MG tablet Take 3 tablets (600 mg total) by mouth 3 (three) times daily. 90 tablet 0 Taking  . metFORMIN (GLUCOPHAGE) 500 MG tablet Take 1 tablet (500 mg total)  by mouth 2 (two) times daily with a meal. 60 tablet 5 Taking  . Prenatal Vit-Fe Fumarate-FA (MULTIVITAMIN-PRENATAL) 27-0.8 MG TABS tablet Take 1 tablet by mouth daily at 12 noon. 180 each 3 Taking     Review of Systems  All systems reviewed and negative except as stated in HPI  Physical Exam Height 5\' 6"  (1.676 m), weight 107.8 kg, last menstrual period 08/08/2017. General appearance: alert, oriented, NAD Lungs: normal respiratory effort Heart: regular rate Abdomen: soft, non-tender; gravid, FH appropriate for GA Extremities: No calf swelling or tenderness Presentation: cephalic by BSUS Fetal monitoring: category I 130s/moderate variability/+accelerations, no decelerations - reactive  Uterine activity: mild intermittent contraction- not a lot of history on monitor yet for good tracking    Prenatal labs: ABO, Rh: O/Positive/-- (05/23 1005) Antibody: Negative (05/23 1005) Rubella: 4.72 (05/23 1005) RPR: Non Reactive (09/23 1632)  HBsAg: Negative (05/23 1005)  HIV: Non Reactive (09/23 1632)  GC/Chlamydia: negative GBS:   positive in urine 2-hr GTT: positive Genetic screening:  NIPS: low risk, AFP: negative Anatomy US: neg  Prenatal Transfer Tool  Maternal Diabetes: Yes:  Diabetes Type:  Insulin/Medication controlled Genetic Screening: Normal Maternal Ultrasounds/Referrals: Normal Fetal Ultrasounds or other Referrals:  Fetal ECHO (04/01/18) - resolved biventricular hypertrophy  Maternal Substance Abuse:  No Significant Maternal Medications:  Meds include: Other: insulin, labetolol, lisinopril (stopped on establishing prenatal care)  Significant Maternal Lab Results: None  No results found for this or any previous visit (from the past 24 hour(s)).  Patient Active Problem List   Diagnosis Date Noted  . Chronic hypertension affecting pregnancy 04/27/2018  . LGA (large for gestational age) fetus affecting management of mother 04/25/2018  . Fetal cardiac anomaly affecting  pregnancy, antepartum 03/22/2018  . Obesity in pregnancy 11/08/2017  . GBS bacteriuria 11/04/2017  . Supervision of high risk pregnancy, antepartum 10/28/2017  . Medication exposure during first trimester of pregnancy 10/28/2017  . Chronic hypertension during pregnancy, antepartum 10/28/2017  . DM2 in pregnancy 10/21/2017    Assessment: Darius BumpDanielle Miller-Pinchback is a 32 y.o. G3P2002 at 559w0d here for IOL for severe HTN. 160/100 with headache at prenatal appointment 11/18, 190/126 at home last night, 151/98 on admission today with labetalol dose to 200mg  TID.  #Labor: IOL with cytotec, will placed FB when able and likely transition to pitocin  #Pain: Refuses epidural. Prefers IV or inhaled #FWB: Category I  cephalic by BSUS  EWF 4220g > 78%90%, AC>95% at  35w5 #ID:  GBS positive in urine - penicillin  #MOF: breast and formula #MOC:vasectomy- already performed # Severe SIP  cHTN - continue labetalol 600mg  TID  Mg++, repeat PIH labs  # T2DM - Home regimen NPH 46U BID, Lispro 30U BID - Will monitor CBG q4hrs, if persistently >120 will start glucose stabilizer   Chelsey Anderson DO 04/27/2018, 2:44 PM   Attestation: I have seen this patient and agree with the resident's documentation. I have examined them separately, and we have discussed the plan of care.  Cristal Deer. Earlene Plater, DO OB/GYN Fellow

## 2018-04-27 NOTE — Progress Notes (Signed)
Here for bp check . Was sent to [facility]U 04/25/18 for very elevated bp. Labetolol dose was increased. States headache comes and goes, now =5. Denies edema. C/o feeling bad during the night and checking her bp at home and it is 190/126 last 2 nights. Denies visual disturbances.  Discussed with Dr. Shawnie PonsPratt patients history, complaints today, bp today =128/ 85. Orders given to admit to L&d. Dr. Shawnie PonsPratt called report to L&D charge RN. Dr. Shawnie PonsPratt in to talk to patient. I took patient to admitting .

## 2018-04-27 NOTE — Anesthesia Pain Management Evaluation Note (Signed)
  CRNA Pain Management Visit Note  Patient: Cheryl BumpDanielle Miller-Pinchback, 32 y.o., female  "Hello I am a [facility] the [facility]. We have an anesthesia team available at all times to provide care throughout the hospital, including epidural management and anesthesia for C-section. I don't know your plan for the delivery whether it a natural birth, water birth, IV sedation, nitrous supplementation, doula or epidural, but we want to meet your pain goals."   1.Was your pain managed to your expectations on prior hospitalizations?   Yes   2.What is your expectation for pain management during this hospitalization?     IV pain meds  3.How can we help you reach that goal? unsure  Record the patient's initial score and the patient's pain goal.   Pain: 0  Pain Goal: 10 [facility] wants you to be able to say your pain was always managed very well.  Cephus ShellingBURGER,Ledora Delker 04/27/2018

## 2018-04-27 NOTE — Progress Notes (Signed)
Glucose by CMP 204.  CBG 75.  Glucostabilizer ordered.  Attempt to start new IV unsuccessful by RN (1 attempt) and 3 CRNA's (4 attempts).  Pt requested us to stop attempting.  MD notified of unsuccessful IV start.  Will continue to monitor CBG's every hour.

## 2018-04-27 NOTE — Progress Notes (Signed)
Labor Progress Note  S: Patient seen & examined for progress of labor. Patient comfortable.   O: BP (!) 154/96   Pulse 87   Temp 98.8 F (37.1 C) (Oral)   Resp 18   Ht 5\' 6"ttydiemRcR$  (1.676 m)   Wt 107.8 kg   LMP 08/08/2017 (Exact Date)   BMI 38.35 kg/m   FHT: 135bpm, mod var, +accels, no decels TOCO: q1-302min, patient looks comfortable during contractions  CVE: Dilation: Fingertip Effacement (%): Thick Cervical Position: Posterior Station: Ballotable Presentation: Vertex(by bedside US) Exam by:: Dr. [name]  A&P: 32 y.o. [id number] 3934w0d here for IOL for superimposed severe pre-eclampsia by BP and HA.    Cytotec x1, with inability to place foley bulb due to still being fingertip, will attempt when able. Continue current management Anticipate SVD  [place]il[place] Ala Kratz, DO [facility] Resident PGY-2 04/27/2018 9:31 PM

## 2018-04-27 NOTE — Progress Notes (Signed)
   04/27/18 2315  Provider Notification  Provider Name/Title Dr. Sheran Fava. Wallace  Method of Notification In department  Notification Reason Lab/diagnostic study results  CBG 186, educated pt on starting glucose stabilizer, Pt refused another IV. Pt stated she was "stuck 5 times" Dr. Sheran Fava Wallace notified.

## 2018-04-27 NOTE — Progress Notes (Signed)
Patient seen and assessed by nursing staff.  Agree with documentation and plan. I have discussed patient with Dr. Macon LargeAnyanwu and we agree to delivery her based on severe range BP's and persistent h/a and GA > 34 wks.

## 2018-04-28 LAB — GLUCOSE, CAPILLARY
GLUCOSE-CAPILLARY: 114 mg/dL — AB (ref 70–99)
GLUCOSE-CAPILLARY: 179 mg/dL — AB (ref 70–99)
GLUCOSE-CAPILLARY: 133 mg/dL — AB (ref 70–99)
GLUCOSE-CAPILLARY: 194 mg/dL — AB (ref 70–99)
Glucose-Capillary: 107 mg/dL — ABNORMAL HIGH (ref 70–99)

## 2018-04-28 LAB — RPR: RPR: NONREACTIVE

## 2018-04-28 MED ORDER — LABETALOL HCL 5 MG/ML IV SOLN: 80.0000 mg | INTRAVENOUS | Status: DC | PRN

## 2018-04-28 MED ORDER — MISOPROSTOL 50MCG HALF TABLET
50.0000 ug | ORAL_TABLET | ORAL | Status: DC | PRN
  Administered 2018-04-28: 50 ug via ORAL
  Filled 2018-04-28 (×2): qty 1

## 2018-04-28 MED ORDER — LABETALOL HCL 5 MG/ML IV SOLN: 20.0000 mg | INTRAVENOUS | Status: DC | PRN

## 2018-04-28 MED ORDER — ACETAMINOPHEN 325 MG PO TABS
650.0000 mg | ORAL_TABLET | Freq: Four times a day (QID) | ORAL | Status: DC | PRN
  Administered 2018-04-28: 650 mg via ORAL
  Filled 2018-04-28: qty 2

## 2018-04-28 MED ORDER — LABETALOL HCL 5 MG/ML IV SOLN: 40.0000 mg | INTRAVENOUS | Status: DC | PRN

## 2018-04-28 MED ORDER — OXYTOCIN 40 UNITS IN LACTATED RINGERS INFUSION - SIMPLE MED
1.0000 m[IU]/min | INTRAVENOUS | Status: DC
  Administered 2018-04-28 – 2018-04-29 (×2): 2 m[IU]/min via INTRAVENOUS
  Filled 2018-04-28 (×2): qty 1000

## 2018-04-28 MED ORDER — HYDRALAZINE HCL 20 MG/ML IJ SOLN
10.0000 mg | INTRAMUSCULAR | Status: DC | PRN
  Administered 2018-04-28: 10 mg via INTRAVENOUS

## 2018-04-28 NOTE — Progress Notes (Signed)
Labor Progress Note  S: Patient seen & examined for progress of labor. Patient comfortable.   O: BP (!) 150/93   Pulse 81   Temp 98.2 F (36.8 C) (Oral)   Resp 16   Ht 5\' 6"  (1.676 m)   Wt 107.8 kg   LMP 08/08/2017 (Exact Date)   BMI 38.35 kg/m   FHT: 130bpm, mod var, +accels, no decels TOCO: irregular, patient looks comfortable during contractions  CVE: Dilation: 2.5 Effacement (%): 50 Cervical Position: Posterior Station: Ballotable Presentation: Vertex(by bedside US) Exam by:: [name],   A&P: 32 y.o. [id number] 5281w1d here for IOL for cHTN and SIPE   Currently on pitocin break, had oral cytotec @2030 ; placed foley bulb and confirmed presentation by bedside US Continue current management, will attempt to restart pitocin after foley bulb is out Hopeful for SVD  [place]ilandJordan Abimael Zeiter, DO FM Resident PGY-2 04/28/2018 8:55 PM

## 2018-04-28 NOTE — Progress Notes (Signed)
Cheryl Curry is a 32 y.o. G3P2002 at 5636w1dadmitted for [facility], DM, on insulin.  Subjective:  No complaints at this time. Not feeling a lot of discomfort with contractions at this time.  Objective: BP (!) 164/94   Pulse 90   Temp 97.6 F (36.4 C) (Oral)   Resp 17   Ht 5\' 6"  (1.676 m)   Wt 107.8 kg   LMP 08/08/2017 (Exact Date)   BMI 38.35 kg/m  I/O last 3 completed shifts: In: 5792.7 [P.O.:3182; I.V.:2060.7; IV Piggyback:550] Out: 4700 [Urine:4700] Total I/O In: -  Out: 300 [Urine:300]  FHT:  FHR: 120 bpm, variability: moderate,  accelerations:  Present,  decelerations:  Absent UC:   regular, every 3-4 minutes SVE:   Dilation: 2 Effacement (%): 50 Station: -3 Exam by:: [employer] CNM  Labs: Lab Results  Component Value Date   WBC 6.5 04/27/2018   HGB 12.0 04/27/2018   HCT 35.3 (L) 04/27/2018   MCV 91.5 04/27/2018   PLT 206 04/27/2018    Assessment / Plan: Induction of labor, on pitocin, making progress at this time. Will continue to increase and consider AROM as needed   Labor: Progressing normally Preeclampsia:  on magnesium sulfate and severe range BP at this time. Will give IV labetalol, and PO morning dose.  Fetal Wellbeing:  Category I Pain Control:  Labor support without medications I/D:  n/a Anticipated MOD:  NSVD  Cheryl Curry 04/28/2018, 9:42 AM

## 2018-04-28 NOTE — Progress Notes (Signed)
Cheryl BumpDanielle Curry is a 32 y.o. G3P2002 at 7469w1d admitted for induction of labor [facility].  Subjective: No complaints. Not feeling any pain or contractions at this time.   Patient currently on 5010mu/min of pitocin.   Objective: BP 138/87   Pulse 78   Temp 97.8 F (36.6 C) (Oral)   Resp 18   Ht 5\' 6"  (1.676 m)   Wt 107.8 kg   LMP 08/08/2017 (Exact Date)   BMI 38.35 kg/m  I/O last 3 completed shifts: In: 5792.7 [P.O.:3182; I.V.:2060.7; IV Piggyback:550] Out: 4700 [Urine:4700] Total I/O In: 2420.9 [P.O.:780; I.V.:740.9; IV Piggyback:900] Out: 1400 [Urine:1400]  FHT:  FHR: 130 bpm, variability: moderate,  accelerations:  Present,  decelerations:  Absent UC:   irregular, every 2-10 minutes SVE:   Dilation: 2 Effacement (%): Thick Station: -3 Exam by:: [employer] CNM  Labs: Lab Results  Component Value Date   WBC 6.5 04/27/2018   HGB 12.0 04/27/2018   HCT 35.3 (L) 04/27/2018   MCV 91.5 04/27/2018   PLT 206 04/27/2018    Assessment / Plan: IOL 2/2/ CHTN with SIPE  - No change throughout the day - Unable to establish a regular contraction pattern with pitocin - Too far dilated for FB, will stop pitocin and try another dose of cytotec as the cervix is still very thick at this time.   Labor: Will stop pitocin and try another dose of cytotec at this time  Preeclampsia:  on magnesium sulfate Fetal Wellbeing:  Category I Pain Control:  Labor support without medications I/D:  n/a Anticipated MOD:  NSVD  Cheryl ShellerHeather Lianny Curry 04/28/2018, 3:20 PM

## 2018-04-29 ENCOUNTER — Inpatient Hospital Stay (HOSPITAL_COMMUNITY): Payer: Medicaid Other | Admitting: Anesthesiology

## 2018-04-29 ENCOUNTER — Encounter (HOSPITAL_COMMUNITY): Payer: Self-pay | Admitting: *Deleted

## 2018-04-29 DIAGNOSIS — O1414 Severe pre-eclampsia complicating childbirth: Secondary | ICD-10-CM

## 2018-04-29 DIAGNOSIS — O2412 Pre-existing diabetes mellitus, type 2, in childbirth: Secondary | ICD-10-CM

## 2018-04-29 LAB — CBC
HCT: 36.6 % (ref 36.0–46.0)
HEMATOCRIT: 34.6 % — AB (ref 36.0–46.0)
HEMOGLOBIN: 12.4 g/dL (ref 12.0–15.0)
HEMOGLOBIN: 11.8 g/dL — AB (ref 12.0–15.0)
MCH: 30.5 pg (ref 26.0–34.0)
MCH: 31.3 pg (ref 26.0–34.0)
MCHC: 33.9 g/dL (ref 30.0–36.0)
MCHC: 34.1 g/dL (ref 30.0–36.0)
MCV: 90.1 fL (ref 80.0–100.0)
MCV: 91.8 fL (ref 80.0–100.0)
Platelets: 209 10*3/uL (ref 150–400)
Platelets: 204 10*3/uL (ref 150–400)
RBC: 4.06 MIL/uL (ref 3.87–5.11)
RBC: 3.77 MIL/uL — ABNORMAL LOW (ref 3.87–5.11)
RDW: 14 % (ref 11.5–15.5)
RDW: 14 % (ref 11.5–15.5)
WBC: 11.9 10*3/uL — ABNORMAL HIGH (ref 4.0–10.5)
WBC: 11.9 10*3/uL — ABNORMAL HIGH (ref 4.0–10.5)
nRBC: 0 % (ref 0.0–0.2)
nRBC: 0 % (ref 0.0–0.2)

## 2018-04-29 LAB — GLUCOSE, CAPILLARY
GLUCOSE-CAPILLARY: 180 mg/dL — AB (ref 70–99)
GLUCOSE-CAPILLARY: 241 mg/dL — AB (ref 70–99)
Glucose-Capillary: 161 mg/dL — ABNORMAL HIGH (ref 70–99)
Glucose-Capillary: 167 mg/dL — ABNORMAL HIGH (ref 70–99)
Glucose-Capillary: 212 mg/dL — ABNORMAL HIGH (ref 70–99)

## 2018-04-29 MED ORDER — MISOPROSTOL 200 MCG PO TABS: 800.0000 ug | ORAL_TABLET | Freq: Once | ORAL | Status: DC

## 2018-04-29 MED ORDER — EPHEDRINE 5 MG/ML INJ
10.0000 mg | INTRAVENOUS | Status: DC | PRN
  Filled 2018-04-29: qty 2

## 2018-04-29 MED ORDER — PHENYLEPHRINE 40 MCG/ML (10ML) SYRINGE FOR IV PUSH (FOR BLOOD PRESSURE SUPPORT)
80.0000 ug | PREFILLED_SYRINGE | INTRAVENOUS | Status: DC | PRN
  Filled 2018-04-29: qty 5
  Filled 2018-04-29: qty 10

## 2018-04-29 MED ORDER — ONDANSETRON HCL 4 MG PO TABS: 4.0000 mg | ORAL_TABLET | ORAL | Status: DC | PRN

## 2018-04-29 MED ORDER — EPHEDRINE 5 MG/ML INJ: 10.0000 mg | INTRAVENOUS | Status: DC | PRN

## 2018-04-29 MED ORDER — IBUPROFEN 600 MG PO TABS
600.0000 mg | ORAL_TABLET | Freq: Four times a day (QID) | ORAL | Status: DC
  Administered 2018-04-29 – 2018-05-02 (×13): 600 mg via ORAL
  Filled 2018-04-29 (×13): qty 1

## 2018-04-29 MED ORDER — TRANEXAMIC ACID-NACL 1000-0.7 MG/100ML-% IV SOLN
1000.0000 mg | INTRAVENOUS | Status: AC
  Administered 2018-04-29: 1000 mg via INTRAVENOUS

## 2018-04-29 MED ORDER — ZOLPIDEM TARTRATE 5 MG PO TABS: 5.0000 mg | ORAL_TABLET | Freq: Every evening | ORAL | Status: DC | PRN

## 2018-04-29 MED ORDER — TETANUS-DIPHTH-ACELL PERTUSSIS 5-2.5-18.5 LF-MCG/0.5 IM SUSP: 0.5000 mL | Freq: Once | INTRAMUSCULAR | Status: DC

## 2018-04-29 MED ORDER — BENZOCAINE-MENTHOL 20-0.5 % EX AERO
1.0000 "application " | INHALATION_SPRAY | CUTANEOUS | Status: DC | PRN
  Administered 2018-04-29: 1 via TOPICAL
  Filled 2018-04-29: qty 56

## 2018-04-29 MED ORDER — FENTANYL 2.5 MCG/ML BUPIVACAINE 1/10 % EPIDURAL INFUSION (WH - ANES)
14.0000 mL/h | INTRAMUSCULAR | Status: DC | PRN
  Administered 2018-04-29: 14 mL/h via EPIDURAL
  Filled 2018-04-29: qty 100

## 2018-04-29 MED ORDER — PHENYLEPHRINE 40 MCG/ML (10ML) SYRINGE FOR IV PUSH (FOR BLOOD PRESSURE SUPPORT): 80.0000 ug | PREFILLED_SYRINGE | INTRAVENOUS | Status: DC | PRN

## 2018-04-29 MED ORDER — SENNOSIDES-DOCUSATE SODIUM 8.6-50 MG PO TABS
2.0000 | ORAL_TABLET | ORAL | Status: DC
  Administered 2018-04-29: 2 via ORAL
  Filled 2018-04-29: qty 2

## 2018-04-29 MED ORDER — MISOPROSTOL 200 MCG PO TABS
ORAL_TABLET | ORAL | Status: AC
  Filled 2018-04-29: qty 4

## 2018-04-29 MED ORDER — LACTATED RINGERS IV SOLN
INTRAVENOUS | Status: DC
  Administered 2018-04-29: 16:00:00 via INTRAVENOUS

## 2018-04-29 MED ORDER — DIPHENHYDRAMINE HCL 50 MG/ML IJ SOLN: 12.5000 mg | INTRAMUSCULAR | Status: DC | PRN

## 2018-04-29 MED ORDER — COCONUT OIL OIL
1.0000 "application " | TOPICAL_OIL | Status: DC | PRN
  Administered 2018-04-29: 1 via TOPICAL
  Filled 2018-04-29: qty 120

## 2018-04-29 MED ORDER — TRANEXAMIC ACID-NACL 1000-0.7 MG/100ML-% IV SOLN
INTRAVENOUS | Status: AC
  Administered 2018-04-29: 1000 mg via INTRAVENOUS
  Filled 2018-04-29: qty 100

## 2018-04-29 MED ORDER — SIMETHICONE 80 MG PO CHEW: 80.0000 mg | CHEWABLE_TABLET | ORAL | Status: DC | PRN

## 2018-04-29 MED ORDER — PRENATAL MULTIVITAMIN CH
1.0000 | ORAL_TABLET | Freq: Every day | ORAL | Status: DC
  Administered 2018-04-29 – 2018-05-02 (×4): 1 via ORAL
  Filled 2018-04-29 (×4): qty 1

## 2018-04-29 MED ORDER — LACTATED RINGERS IV SOLN
500.0000 mL | Freq: Once | INTRAVENOUS | Status: AC
  Administered 2018-04-29: 500 mL via INTRAVENOUS

## 2018-04-29 MED ORDER — DIBUCAINE 1 % RE OINT: 1.0000 "application " | TOPICAL_OINTMENT | RECTAL | Status: DC | PRN

## 2018-04-29 MED ORDER — ONDANSETRON HCL 4 MG/2ML IJ SOLN: 4.0000 mg | INTRAMUSCULAR | Status: DC | PRN

## 2018-04-29 MED ORDER — WITCH HAZEL-GLYCERIN EX PADS: 1.0000 "application " | MEDICATED_PAD | CUTANEOUS | Status: DC | PRN

## 2018-04-29 MED ORDER — ACETAMINOPHEN 325 MG PO TABS: 650.0000 mg | ORAL_TABLET | ORAL | Status: DC | PRN

## 2018-04-29 MED ORDER — INSULIN ASPART 100 UNIT/ML ~~LOC~~ SOLN
0.0000 [IU] | Freq: Three times a day (TID) | SUBCUTANEOUS | Status: DC
  Administered 2018-04-29 (×2): 6 [IU] via SUBCUTANEOUS
  Administered 2018-04-29: 3 [IU] via SUBCUTANEOUS
  Administered 2018-04-30: 2 [IU] via SUBCUTANEOUS
  Administered 2018-04-30: 1 [IU] via SUBCUTANEOUS
  Administered 2018-04-30: 2 [IU] via SUBCUTANEOUS
  Administered 2018-05-01: 1 [IU] via SUBCUTANEOUS
  Administered 2018-05-01: 3 [IU] via SUBCUTANEOUS
  Administered 2018-05-02: 1 [IU] via SUBCUTANEOUS

## 2018-04-29 MED ORDER — INSULIN NPH (HUMAN) (ISOPHANE) 100 UNIT/ML ~~LOC~~ SUSP
20.0000 [IU] | Freq: Two times a day (BID) | SUBCUTANEOUS | Status: DC
  Administered 2018-04-29 – 2018-05-02 (×6): 20 [IU] via SUBCUTANEOUS
  Filled 2018-04-29: qty 10

## 2018-04-29 MED ORDER — ENALAPRIL MALEATE 10 MG PO TABS
10.0000 mg | ORAL_TABLET | Freq: Two times a day (BID) | ORAL | Status: DC
  Administered 2018-04-29 – 2018-05-02 (×6): 10 mg via ORAL
  Filled 2018-04-29 (×8): qty 1

## 2018-04-29 MED ORDER — LIDOCAINE HCL (PF) 1 % IJ SOLN
INTRAMUSCULAR | Status: DC | PRN
  Administered 2018-04-29 (×2): 6 mL via EPIDURAL

## 2018-04-29 MED ORDER — DIPHENHYDRAMINE HCL 25 MG PO CAPS: 25.0000 mg | ORAL_CAPSULE | Freq: Four times a day (QID) | ORAL | Status: DC | PRN

## 2018-04-29 NOTE — Anesthesia Preprocedure Evaluation (Addendum)
Anesthesia Evaluation  Patient identified by MRN, date of birth, ID band Patient awake    Reviewed: Allergy & Precautions, NPO status , Patient's Chart, lab work & pertinent test results, reviewed documented beta blocker date and time   History of Anesthesia Complications Negative for: history of anesthetic complications  Airway Mallampati: III  TM Distance: >3 FB Neck ROM: Full    Dental  (+) Dental Advisory Given   Pulmonary neg pulmonary ROS,    breath sounds clear to auscultation       Cardiovascular hypertension (PIH: labetalol), Pt. on medications (-) angina Rhythm:Regular Rate:Normal     Neuro/Psych negative neurological ROS     GI/Hepatic negative GI ROS, Neg liver ROS,   Endo/Other  diabetes (glu 167), Insulin Dependent, Oral Hypoglycemic AgentsMorbid obesity  Renal/GU negative Renal ROS     Musculoskeletal   Abdominal (+) + obese,   Peds  Hematology plt 209k   Anesthesia Other Findings   Reproductive/Obstetrics (+) Pregnancy                            Anesthesia Physical Anesthesia Plan  ASA: III  Anesthesia Plan: Epidural   Post-op Pain Management:    Induction:   PONV Risk Score and Plan: 2 and Treatment may vary due to age or medical condition  Airway Management Planned: Natural Airway  Additional Equipment:   Intra-op Plan:   Post-operative Plan:   Informed Consent: I have reviewed the patients History and Physical, chart, labs and discussed the procedure including the risks, benefits and alternatives for the proposed anesthesia with the patient or authorized representative who has indicated his/her understanding and acceptance.   Dental advisory given  Plan Discussed with:   Anesthesia Plan Comments: (Patient identified. Risks/Benefits/Options discussed with patient including but not limited to bleeding, infection, nerve damage, paralysis, failed block,  incomplete pain control, headache, blood pressure changes, nausea, vomiting, reactions to medication both or allergic, itching and postpartum back pain. Confirmed with bedside nurse the patient's most recent platelet count. Confirmed with patient that they are not currently taking any anticoagulation, have any bleeding history or any family history of bleeding disorders. Patient expressed understanding and wished to proceed. All questions were answered. )       Anesthesia Quick Evaluation

## 2018-04-29 NOTE — Progress Notes (Signed)
Labor Progress Note  S: Strip note, and plan discussed with RN.    O: BP 125/68   Pulse 80   Temp 98.2 F (36.8 C) (Oral)   Resp 16   Ht 5\' 6"  (1.676 m)   Wt 107.8 kg   LMP 08/08/2017 (Exact Date)   BMI 38.35 kg/m   FHT: 130bpm, mod var, +accels, no decels TOCO: irregular, patient looks comfortable during contractions  CVE: Dilation: 2.5 Effacement (%): 50 Cervical Position: Posterior Station: Ballotable Presentation: Vertex(by bedside US) Exam by:: [name],   A&P: 32 y.o. [id number] 6861w2d here for cHTN and SIPE   Foley bulb still in place Continue current management Anticipate SVD  [place]Jordan Fishel Wamble, DO [facility] Resident PGY-2 04/29/2018 12:24 AM

## 2018-04-29 NOTE — Anesthesia Procedure Notes (Signed)
Epidural Patient location during procedure: OB Start time: 04/29/2018 5:04 AM End time: 04/29/2018 5:31 AM  Staffing Anesthesiologist: Jairo BenJackson, Patriciaann Rabanal, MD Performed: anesthesiologist   Preanesthetic Checklist Completed: patient identified, surgical consent, pre-op evaluation, timeout performed, IV checked, risks and benefits discussed and monitors and equipment checked  Epidural Patient position: sitting Prep: site prepped and draped and DuraPrep Patient monitoring: blood pressure, continuous pulse ox and heart rate Approach: midline Location: L2-L3 Injection technique: LOR air  Needle:  Needle type: Tuohy  Needle gauge: 17 G Needle length: 9 cm Needle insertion depth: 5.5 cm Catheter type: closed end flexible Catheter size: 19 Gauge Catheter at skin depth: 11.5 cm Test dose: negative (1% lidocaine)  Assessment Events: blood not aspirated, injection not painful, no injection resistance, negative IV test and no paresthesia  Additional Notes Pt identified in Labor room.  Monitors applied. Working IV access confirmed. Sterile prep, drape lumbar spine.  1% lido local L 2,3.  #17ga Touhy LOR air at 5.5 cm L 2,3, cath in easily to 11.5 cm skin. Test dose OK, cath dosed and infusion begun.  Patient asymptomatic, VSS, no heme aspirated, tolerated well.  Cheryl Curry, MDReason for block:procedure for pain

## 2018-04-29 NOTE — Anesthesia Postprocedure Evaluation (Signed)
Anesthesia Post Note  Patient: Haylen Miller-Pinchback  Procedure(s) Performed: AN AD HOC LABOR EPIDURAL     Patient location during evaluation: Mother Baby Anesthesia Type: Epidural Level of consciousness: awake and alert, oriented and patient cooperative Pain management: pain level controlled Vital Signs Assessment: post-procedure vital signs reviewed and stable Respiratory status: spontaneous breathing Cardiovascular status: stable Postop Assessment: no headache, epidural receding, patient able to bend at knees and no signs of nausea or vomiting Anesthetic complications: no Comments: Pain score 0.    Last Vitals:  Vitals:   04/29/18 1126 04/29/18 1533  BP: (!) 157/92 (!) 150/90  Pulse: 79 71  Resp: 18 18  Temp: 36.6 C 36.6 C  SpO2: 99% 100%    Last Pain:  Vitals:   04/29/18 1533  TempSrc: Oral  PainSc:    Pain Goal: Patients Stated Pain Goal: 3 (04/29/18 1130)               Merrilyn PumaWRINKLE,Sinclair Arrazola

## 2018-04-29 NOTE — Progress Notes (Signed)
Labor Progress Note  S: Patient seen & examined for progress of labor. Patient comfortable with epidural.   O: BP (!) 144/90   Pulse 85   Temp 98.2 F (36.8 C) (Oral)   Resp 16   Ht 5\' 6"  (1.676 m)   Wt 107.8 kg   LMP 08/08/2017 (Exact Date)   BMI 38.35 kg/m   FHT: 125bpm, mod var, +accels, no decels TOCO: q2-787min, patient looks comfortable during contractions  CVE: Dilation: (P) 6.5 Effacement (%): (P) 90 Cervical Position: Middle Station: (P) -3 Presentation: (P) Vertex Exam by:: (P) Cheryl Ditchaniell Wade, Rn  A&P: 32 y.o. [id number] 508w2d here for cHTN and SIPE   Will restart pitocin  Continue current management Anticipate SVD  [place]ilandJordan Mishal Probert, DO FM Resident PGY-2 04/29/2018 5:57 AM

## 2018-04-30 LAB — CBC
HEMATOCRIT: 32 % — AB (ref 36.0–46.0)
Hemoglobin: 10.7 g/dL — ABNORMAL LOW (ref 12.0–15.0)
MCH: 30.6 pg (ref 26.0–34.0)
MCHC: 33.4 g/dL (ref 30.0–36.0)
MCV: 91.4 fL (ref 80.0–100.0)
NRBC: 0 % (ref 0.0–0.2)
Platelets: 199 10*3/uL (ref 150–400)
RBC: 3.5 MIL/uL — ABNORMAL LOW (ref 3.87–5.11)
RDW: 14 % (ref 11.5–15.5)
WBC: 11 10*3/uL — ABNORMAL HIGH (ref 4.0–10.5)

## 2018-04-30 LAB — GLUCOSE, CAPILLARY
GLUCOSE-CAPILLARY: 123 mg/dL — AB (ref 70–99)
Glucose-Capillary: 123 mg/dL — ABNORMAL HIGH (ref 70–99)
Glucose-Capillary: 110 mg/dL — ABNORMAL HIGH (ref 70–99)

## 2018-04-30 LAB — BIRTH TISSUE RECOVERY COLLECTION (PLACENTA DONATION)

## 2018-04-30 MED ORDER — METFORMIN HCL 500 MG PO TABS
500.0000 mg | ORAL_TABLET | Freq: Two times a day (BID) | ORAL | Status: DC
  Administered 2018-04-30 – 2018-05-02 (×4): 500 mg via ORAL
  Filled 2018-04-30 (×6): qty 1

## 2018-04-30 MED ORDER — SENNOSIDES-DOCUSATE SODIUM 8.6-50 MG PO TABS: 2.0000 | ORAL_TABLET | Freq: Every evening | ORAL | Status: DC | PRN

## 2018-04-30 NOTE — Progress Notes (Signed)
Patient"s BP  Needs to be rechecked at this time but patient refused . I just want my blood pressure med.  Explained protocol but still insisted not to be recheck instead give her antihypertensive med.

## 2018-04-30 NOTE — Lactation Note (Signed)
This note was copied from a baby's chart. Lactation Consultation Note  Patient Name: Girl Darius BumpDanielle Miller-Pinchback UJWJX'BToday's Date: 04/30/2018 Reason for consult: Initial assessment;NICU baby;Late-preterm 34-36.6wks P3, at 22 hours female infant in NICU. Mom's hx GTHN, GDM on insulin in pregnancy and on Mag. Per mom, she BF her other two children for 2 weeks they were born at 37 weeks. Per mom, [facility]  [facility] [place] She doesn't have DEBP [facility]. LC asked mom demonstrated hand expression mom hand expressed 3 ml of colostrum labels given and Mom will take to NICU. Mom plans to pump every 3 hours for 15 minutes and do hand expressions with pumping.  LC dicussed I & O. Mom made aware of O/P services, breastfeeding support groups, community resources, and our phone # for post-discharge questions.  Maternal Data    Feeding    LATCH Score                   Interventions Interventions: Breast feeding basics reviewed;Hand express;DEBP  Lactation Tools Discussed/Used     Consult Status Consult Status: Follow-up Date: 04/30/18 Follow-up type: In-patient    Danelle EarthlyRobin Lariyah Shetterly 04/30/2018, 6:54 AM

## 2018-04-30 NOTE — Progress Notes (Signed)
Post Partum Day 1, stopping magnesium this am Subjective: no complaints  Objective: Blood pressure (!) 149/91, pulse 66, temperature 98.5 F (36.9 C), temperature source Oral, resp. rate 20, height 5\' 6"  (1.676 m), weight 107.8 kg, last menstrual period 08/08/2017, SpO2 96 %, unknown if currently breastfeeding.  Vitals:   04/29/18 2200 04/29/18 2332 04/30/18 0414 04/30/18 0814  BP:  112/70 123/80 (!) 149/91  Pulse:  80 68 66  Resp: 17  18 20   Temp:  98 F (36.7 C) 98.1 F (36.7 C) 98.5 F (36.9 C)  TempSrc:  Oral Oral Oral  SpO2:  96% 96% 96%  Weight:      Height:        Physical Exam:  General: alert, cooperative and no distress Lochia: appropriate Uterine Fundus: firm Incision:  DVT Evaluation: No evidence of DVT seen on physical exam.  Recent Labs    04/29/18 0850 04/30/18 0713  HGB 11.8* 10.7*  HCT 34.6* 32.0*    Assessment/Plan: Plan for discharge tomorrow  Continue BP meds and insulin CBG OK should improve and don't want to make too tight control   LOS: 3 days   Cheryl Curry 04/30/2018, 8:34 AM

## 2018-05-01 LAB — GLUCOSE, CAPILLARY
GLUCOSE-CAPILLARY: 160 mg/dL — AB (ref 70–99)
GLUCOSE-CAPILLARY: 164 mg/dL — AB (ref 70–99)
GLUCOSE-CAPILLARY: 81 mg/dL (ref 70–99)
Glucose-Capillary: 100 mg/dL — ABNORMAL HIGH (ref 70–99)
Glucose-Capillary: 177 mg/dL — ABNORMAL HIGH (ref 70–99)

## 2018-05-01 MED ORDER — HYDRALAZINE HCL 20 MG/ML IJ SOLN
10.0000 mg | Freq: Once | INTRAMUSCULAR | Status: AC
  Administered 2018-05-01: 10 mg via INTRAVENOUS
  Filled 2018-05-01: qty 1

## 2018-05-01 MED ORDER — HYDROCHLOROTHIAZIDE 25 MG PO TABS
25.0000 mg | ORAL_TABLET | Freq: Every day | ORAL | Status: DC
  Administered 2018-05-01 – 2018-05-02 (×2): 25 mg via ORAL
  Filled 2018-05-01 (×2): qty 1

## 2018-05-01 MED ORDER — OXYCODONE HCL 5 MG PO TABS: 10.0000 mg | ORAL_TABLET | Freq: Four times a day (QID) | ORAL | Status: DC | PRN

## 2018-05-01 NOTE — Progress Notes (Signed)
Post Partum Day 2 Subjective: Slight HA this morning. Meeting all PP goals  Objective: Blood pressure (!) 159/87, pulse (!) 58, temperature 98.4 F (36.9 C), temperature source Oral, resp. rate 20, height 5\' 6"  (1.676 m), weight 107.8 kg, last menstrual period 08/08/2017, SpO2 100 %, unknown if currently breastfeeding.  Vitals:   04/30/18 2000 05/01/18 0030 05/01/18 0500 05/01/18 0600  BP: (!) 166/97 (!) 160/91  (!) 159/87  Pulse:  62  (!) 58  Resp:  19 20 20   Temp:  99.1 F (37.3 C)  98.4 F (36.9 C)  TempSrc:  Oral  Oral  SpO2:  99% 100%   Weight:      Height:        Physical Exam:  General: alert, cooperative and no distress Lochia: appropriate Uterine Fundus: firm below the umbilicus, nttp. Abdomen nttp Normal s1 and s2, no mrgs ctab  Current Facility-Administered Medications  Medication Dose Route Frequency Provider Last Rate Last Dose  . acetaminophen (TYLENOL) tablet 650 mg  650 mg Oral Q4H PRN Shirley, [place], DO      . benzocaine-Menthol (DERMOPLAST) 20-0.5 % topical spray 1 application  1 application Topical PRN Shirley, [place], DO   1 application at 04/29/18 1139  . coconut oil  1 application Topical PRN Shirley, [place], DO   1 application at 04/29/18 1139  . witch hazel-glycerin (TUCKS) pad 1 application  1 application Topical PRN Shirley, [place], DO       And  . dibucaine (NUPERCAINAL) 1 % rectal ointment 1 application  1 application Rectal PRN Shirley, [place], DO      . diphenhydrAMINE (BENADRYL) capsule 25 mg  25 mg Oral Q6H PRN Shirley, [place], DO      . enalapril (VASOTEC) tablet 10 mg  10 mg Oral BID Anyanwu, Ugonna A, MD   10 mg at 04/30/18 2000  . hydrochlorothiazide (HYDRODIURIL) tablet 25 mg  25 mg Oral Daily [place] BingPickens, Ashwath Lasch, MD      . ibuprofen (ADVIL,MOTRIN) tablet 600 mg  600 mg Oral Q6H Shirley, [place], DO   600 mg at 05/01/18 0615  . insulin aspart (novoLOG) injection 0-14 Units  0-14 Units Subcutaneous TID WC Shirley, [place], DO   1 Units at  04/30/18 2037  . insulin NPH Human (HUMULIN N,NOVOLIN N) injection 20 Units  20 Units Subcutaneous BID AC & HS Shirley, [place]az[place], DO   20 Units at 04/30/18 2251  . metFORMIN (GLUCOPHAGE) tablet 500 mg  500 mg Oral BID WC [place] BingPickens, Hadyn Azer, MD   500 mg at 04/30/18 2038  . ondansetron (ZOFRAN) tablet 4 mg  4 mg Oral Q4H PRN Shirley, [place], DO       Or  . ondansetron [facility](ZOFRAN) injection 4 mg  4 mg Intravenous Q4H PRN Shirley, [place], DO      . oxyCODONE (Oxy IR/ROXICODONE) immediate release tablet 10 mg  10 mg Oral Q6H PRN [place] BingPickens, Muhannad Bignell, MD      . prenatal multivitamin tablet 1 tablet  1 tablet Oral Q1200 Shirley, [place], DO   1 tablet at 04/30/18 0849  . senna-docusate (Senokot-S) tablet 2 tablet  2 tablet Oral QHS PRN [place] BingPickens, Emory Leaver, MD      . simethicone (MYLICON) chewable tablet 80 mg  80 mg Oral PRN Shirley, [place]az[place], DO      . zolpidem (AMBIEN) tablet 5 mg  5 mg Oral QHS PRN Shirley, [place], DO        Recent Labs    04/29/18 0850 04/30/18 0713  HGB 11.8* 10.7*  HCT 34.6* 32.0*   Results for SHALICIA, CRAGHEAD (MRN [id number]) as of 05/01/2018 07:35  Ref. Range 04/29/2018 20:55 04/30/2018 08:32 04/30/2018 13:49 04/30/2018 20:34 05/01/2018 00:25  Glucose-Capillary Latest Ref Range: 70 - 99 mg/dL 191 (H) 478 (H) 295 (H) 110 (H) 160 (H)   Assessment/Plan: Pt stable  *PP: routine care. Pt just took some motrin for HA. Oxycodone PRN ordered for her and I said if she still has a HA after breakfast to let the RN and try the oxycodone.  *DM2: BS are acceptable. Metformin added back to regimen *cHTN: will add back HCTZ (pt was on this prior to pregnancy). I d/w her that may or may not be able to go today based on her BPs *Dispo: maybe later today   LOS: 4 days   [place] Reliford 05/01/2018, 7:33 AM

## 2018-05-01 NOTE — Lactation Note (Addendum)
This note was copied from a baby's chart. Lactation Consultation Note  Patient Name: Cheryl Curry Date: 05/01/2018 Reason for consult: Follow-up assessment;NICU baby;Late-preterm 34-36.6wks;Maternal endocrine disorder Type of Endocrine Disorder?: Diabetes   Visited with mom of a 29 hours old NICU baby, she was pumping when entering the room. Mom voiced to [facility] that she got 6 ml of colostrum this morning (the most she's had so far), praised her for her efforts. LC noticed that one of the junctures in her pump was loose and adjusted it; let her know that she can take the pump kit with her including the tubing attached to the pump upon her discharge so she can use it when she comes to see her baby in NICU.  She'll be discharged today, reviewed discharge instructions, engorgement prevention and treatment and also treatment and prevention for sore nipples. LC offered a [facility] loaner since mom doesn't have a pump at home but she refused, she's not interested in paying any money, even though she'll get it back when she returns the pump, mom will be getting her pump through the [facility] office. Encouraged mom to keep pumping every 3 hours and at least once at night. She's aware of [place] OP services and will contact PRN.  Maternal Data    Feeding    Interventions Interventions: Breast feeding basics reviewed;DEBP  Lactation Tools Discussed/Used Tools: Pump Breast pump type: Double-Electric Breast Pump   Consult Status Consult Status: PRN Date: 05/01/18 Follow-up type: Call as needed    Cheryl Curry 05/01/2018, 12:04 PM

## 2018-05-02 ENCOUNTER — Encounter: Payer: Self-pay | Admitting: Obstetrics & Gynecology

## 2018-05-02 ENCOUNTER — Telehealth: Payer: Self-pay | Admitting: *Deleted

## 2018-05-02 ENCOUNTER — Other Ambulatory Visit: Payer: Self-pay

## 2018-05-02 ENCOUNTER — Other Ambulatory Visit: Payer: Self-pay | Admitting: Obstetrics and Gynecology

## 2018-05-02 LAB — CBC
HCT: 34.9 % — ABNORMAL LOW (ref 36.0–46.0)
Hemoglobin: 11.5 g/dL — ABNORMAL LOW (ref 12.0–15.0)
MCH: 30.3 pg (ref 26.0–34.0)
MCHC: 33 g/dL (ref 30.0–36.0)
MCV: 92.1 fL (ref 80.0–100.0)
PLATELETS: 220 10*3/uL (ref 150–400)
RBC: 3.79 MIL/uL — AB (ref 3.87–5.11)
RDW: 13.6 % (ref 11.5–15.5)
WBC: 8.7 10*3/uL (ref 4.0–10.5)
nRBC: 0 % (ref 0.0–0.2)

## 2018-05-02 LAB — COMPREHENSIVE METABOLIC PANEL
ALT: 23 U/L (ref 0–44)
ANION GAP: 8 (ref 5–15)
AST: 30 U/L (ref 15–41)
Albumin: 3 g/dL — ABNORMAL LOW (ref 3.5–5.0)
Alkaline Phosphatase: 91 U/L (ref 38–126)
BUN: 9 mg/dL (ref 6–20)
CHLORIDE: 103 mmol/L (ref 98–111)
CO2: 23 mmol/L (ref 22–32)
CREATININE: 0.76 mg/dL (ref 0.44–1.00)
Calcium: 8.6 mg/dL — ABNORMAL LOW (ref 8.9–10.3)
Glucose, Bld: 162 mg/dL — ABNORMAL HIGH (ref 70–99)
Potassium: 3.7 mmol/L (ref 3.5–5.1)
Sodium: 134 mmol/L — ABNORMAL LOW (ref 135–145)
Total Bilirubin: 0.5 mg/dL (ref 0.3–1.2)
Total Protein: 6.4 g/dL — ABNORMAL LOW (ref 6.5–8.1)

## 2018-05-02 LAB — GLUCOSE, CAPILLARY
GLUCOSE-CAPILLARY: 102 mg/dL — AB (ref 70–99)
GLUCOSE-CAPILLARY: 75 mg/dL (ref 70–99)

## 2018-05-02 MED ORDER — HYDROCHLOROTHIAZIDE 25 MG PO TABS: 25.0000 mg | ORAL_TABLET | Freq: Every day | ORAL | Status: DC

## 2018-05-02 MED ORDER — NIFEDIPINE 10 MG PO CAPS
20.0000 mg | ORAL_CAPSULE | Freq: Once | ORAL | Status: AC
  Administered 2018-05-02: 20 mg via ORAL
  Filled 2018-05-02: qty 2

## 2018-05-02 MED ORDER — HYDRALAZINE HCL 20 MG/ML IJ SOLN: 10.0000 mg | INTRAMUSCULAR | Status: DC | PRN

## 2018-05-02 MED ORDER — HYDRALAZINE HCL 20 MG/ML IJ SOLN: 5.0000 mg | INTRAMUSCULAR | Status: DC | PRN

## 2018-05-02 MED ORDER — NIFEDIPINE ER OSMOTIC RELEASE 30 MG PO TB24: 30.0000 mg | ORAL_TABLET | Freq: Two times a day (BID) | ORAL | 0 refills | Status: DC

## 2018-05-02 MED ORDER — NIFEDIPINE ER OSMOTIC RELEASE 30 MG PO TB24: 30.0000 mg | ORAL_TABLET | Freq: Every day | ORAL | Status: DC

## 2018-05-02 MED ORDER — LABETALOL HCL 5 MG/ML IV SOLN: 40.0000 mg | INTRAVENOUS | Status: DC | PRN

## 2018-05-02 MED ORDER — LABETALOL HCL 5 MG/ML IV SOLN: 20.0000 mg | INTRAVENOUS | Status: DC | PRN

## 2018-05-02 NOTE — Lactation Note (Signed)
This note was copied from a baby's chart. Lactation Consultation Note  Patient Name: Girl Darius BumpDanielle Miller-Pinchback NFAOZ'HToday's Date: 05/02/2018  [facility]   Maternal Data    Feeding Feeding Type: Formula Nipple Type: Slow - flow  LATCH Score                   Interventions    Lactation Tools Discussed/Used     Consult Status      Huston FoleyMOULDEN, Cornelious Bartolucci S 05/02/2018, 8:52 AM

## 2018-05-02 NOTE — Progress Notes (Signed)
MD notified of severe range BP and refusing IV BP meds

## 2018-05-02 NOTE — Progress Notes (Signed)
OB Note Patient to leave AMA. D/w her recommend staying. D/w pt to please continue to take hctz daily and to start procardia xl 30mg  bid with first dose tonight at 9 or 10pm. Will send message to clinic for bp check later this week  Cornelia Copaharlie Lezlie Ritchey, Jr MD Attending Center for [facility] (Faculty Practice) 05/02/2018 Time: [phone]

## 2018-05-02 NOTE — Progress Notes (Addendum)
Inpatient Diabetes Program Recommendations  AACE/ADA: New Consensus Statement on Inpatient Glycemic Control (2015)  Target Ranges:  Prepandial:   less than 140 mg/dL      Peak postprandial:   less than 180 mg/dL (1-2 hours)      Critically ill patients:  140 - 180 mg/dL   Lab Results  Component Value Date   GLUCAP 102 (H) 05/02/2018   HGBA1C 8.9 (H) 03/16/2018    Review of Glycemic Control Results for Darius BumpMILLER-PINCHBACK, Sadaf (MRN [id number]) as of 05/02/2018 09:24  Ref. Range 05/01/2018 23:22 05/02/2018 08:33  Glucose-Capillary Latest Ref Range: 70 - 99 mg/dL 409177 (H) 811102 (H)   Diabetes history: Type 2 DM Outpatient Diabetes medications: Novolog 20-30 units TID, NPH 46 units BID Current orders for Inpatient glycemic control: Metformin 500 mg BID, NPH 20 units BID, Novolog 0-14 units TID  Inpatient Diabetes Program Recommendations:    Patient continues to have increased BPs.   If to remain inpatient MD consider:  Noted patient has regular diet ordered, Consider carb modified?  Additionally, patient has pregnant patient order set in. Consider changing to Novolog 0-15 units TID under glycemic control order set.     Thanks, Lujean RaveLauren Tannisha Kennington, MSN, RNC-OB Diabetes Coordinator [phone] (8a-5p)

## 2018-05-02 NOTE — Telephone Encounter (Signed)
Open in error

## 2018-05-02 NOTE — Progress Notes (Addendum)
Pt leaving AMA, MD notified. AMA form signed. Risks of leaving AMA discussed and which symptoms such as blurred vision and severe headache to report back to the MD. MD sent BP meds to Pt's pharmacy. RN discussed with pt on when to take the next dose of medication.

## 2018-05-02 NOTE — Progress Notes (Signed)
MD notified of severe range BP. MD ordered to recheck BP, give schedule BP meds early and recheck BP 20 minutes after

## 2018-05-02 NOTE — Progress Notes (Signed)
Oral Procardia given, will recheck BP

## 2018-05-02 NOTE — Progress Notes (Signed)
Daily Post Partum Note  05/02/2018 Cheryl Curry is a 32 y.o. [id number] PPD#3 s/p  SVD/right sulcal (repaired)  @ [redacted]w[redacted]d for severe pre-x superimposed on poorly controlled cHTN.  Pregnancy c/b poorly controlled cHTN, poorly controlled DM2, poor patient compliance 24hr/overnight events:  Pt just had spike to severe range BP  Subjective:  No s/s of pre-eclampsia. Pt desires d/c to home.   Objective:    Current Vital Signs 24h Vital Sign Ranges  T 98.7 F (37.1 C) Temp  Avg: 98.6 F (37 C)  Min: 98.3 F (36.8 C)  Max: 98.7 F (37.1 C)  BP (!) 155/99 BP  Min: 148/84  Max: 174/114  HR 83 Pulse  Avg: 74.4  Min: 66  Max: 83  RR 18 Resp  Avg: 19  Min: 18  Max: 20  SaO2 98 % Room Air SpO2  Avg: 98.7 %  Min: 97 %  Max: 100 %       24 Hour I/O Current Shift I/O  Time Ins Outs 11/24 0701 - 11/25 0700 In: 600 [P.O.:600] Out: 3000 [Urine:3000] 11/25 0701 - 11/25 1900 In: -  Out: 900 [Urine:900]   Patient Vitals for the past 24 hrs:  BP Temp Temp src Pulse Resp SpO2  05/02/18 0928 (!) 155/99 - - - - -  05/02/18 0854 (!) 157/110 - - - - -  05/02/18 0846 (!) 174/114 - - 83 - -  05/02/18 0821 (!) 165/105 98.7 F (37.1 C) Oral 72 18 98 %  05/02/18 0613 (!) 148/84 98.5 F (36.9 C) Oral 66 18 98 %  05/01/18 2335 (!) 158/95 - - 69 20 -  05/01/18 2321 (!) 160/96 98.7 F (37.1 C) Oral 70 18 100 %  05/01/18 2000 (!) 157/98 - - 72 19 97 %  05/01/18 1623 (!) 154/99 98.6 F (37 C) Oral 82 20 99 %  05/01/18 1223 (!) 150/92 98.3 F (36.8 C) - 81 20 100 %   General: NAD Abdomen: nttp. Firm fundus below umbilicus. Perineum: deferred Skin:  Warm and dry.  Cardiovascular: S1, S2 normal, no murmur, rub or gallop, regular rate and rhythm Respiratory:  Clear to auscultation bilateral. Normal respiratory effort  Medications Current Facility-Administered Medications  Medication Dose Route Frequency Provider Last Rate Last Dose  . acetaminophen (TYLENOL) tablet 650 mg  650 mg Oral Q4H  PRN Shirley, [place], DO      . benzocaine-Menthol (DERMOPLAST) 20-0.5 % topical spray 1 application  1 application Topical PRN Shirley, [place], DO   1 application at 04/29/18 1139  . coconut oil  1 application Topical PRN Shirley, [place], DO   1 application at 04/29/18 1139  . witch hazel-glycerin (TUCKS) pad 1 application  1 application Topical PRN Shirley, [place], DO       And  . dibucaine (NUPERCAINAL) 1 % rectal ointment 1 application  1 application Rectal PRN Shirley, [place], DO      . diphenhydrAMINE (BENADRYL) capsule 25 mg  25 mg Oral Q6H PRN Shirley, [place], DO      . enalapril (VASOTEC) tablet 10 mg  10 mg Oral BID Anyanwu, Jethro Bastos, MD   10 mg at 05/02/18 0854  . hydrochlorothiazide (HYDRODIURIL) tablet 25 mg  25 mg Oral Daily [place] Bing, MD   25 mg at 05/02/18 0856  . ibuprofen (ADVIL,MOTRIN) tablet 600 mg  600 mg Oral Q6H Shirley, [place], DO   600 mg at 05/02/18 0548  . insulin aspart (novoLOG) injection 0-14 Units  0-14 Units Subcutaneous TID  WC [place] ForestShirley, [place], DO   1 Units at 05/02/18 0840  . insulin NPH Human (HUMULIN N,NOVOLIN N) injection 20 Units  20 Units Subcutaneous BID AC & HS Shirley, [place], DO   20 Units at 05/02/18 0840  . metFORMIN (GLUCOPHAGE) tablet 500 mg  500 mg Oral BID WC [place] BingPickens, Amulya Quintin, MD   500 mg at 05/02/18 0841  . ondansetron (ZOFRAN) tablet 4 mg  4 mg Oral Q4H PRN Shirley, [place], DO       Or  . ondansetron [facility](ZOFRAN) injection 4 mg  4 mg Intravenous Q4H PRN Shirley, [place], DO      . oxyCODONE (Oxy IR/ROXICODONE) immediate release tablet 10 mg  10 mg Oral Q6H PRN [place] BingPickens, Sameen Leas, MD      . prenatal multivitamin tablet 1 tablet  1 tablet Oral Q1200 Shirley, [place], DO   1 tablet at 05/01/18 1136  . senna-docusate (Senokot-S) tablet 2 tablet  2 tablet Oral QHS PRN  BingPickens, Dnya Hickle, MD      . simethicone (MYLICON) chewable tablet 80 mg  80 mg Oral PRN Shirley, [place], DO      . zolpidem (AMBIEN) tablet 5 mg  5 mg Oral QHS PRN Shirley, [place], DO         Labs:  Results for Cheryl Curry (MRN [id number]) as of 05/02/2018 09:47  Ref. Range 05/01/2018 08:26 05/01/2018 14:21 05/01/2018 19:56 05/01/2018 23:22 05/02/2018 08:33  Glucose-Capillary Latest Ref Range: 70 - 99 mg/dL 81 147100 (H) 829164 (H) 562177 (H) 102 (H)    Assessment & Plan:  Still with elevated BPs, pt otherwise asymptomatic *Postpartum/postop: routine care. O POS. Pumping. vasectomy *DM2: continue current regimen *cHTN with superimposed pre-x: am meds given early when BP was elevated. Will continue to follow. hctz just added on yesterday and d/w pt may just need more time to work. I told her that if her bps are acceptable today they may be able to go. Will also rpt cbc, cmp to make sure no changes. *Dispo: maybe late afternoon today  Cornelia Copaharlie Michon Kaczmarek, Jr. MD Attending Center for [facility](Faculty Practice)

## 2018-05-02 NOTE — Progress Notes (Signed)
OB Note Patient had IV removed b/c she didn't want it in b/c it was causing discomfort. She denies any HA or visual s/s.   Patient Vitals for the past 24 hrs:  BP Temp Temp src Pulse Resp SpO2  05/02/18 1325 (!) 175/113 - - - - -  05/02/18 1306 (!) 178/109 - - 70 18 100 %  05/02/18 0928 (!) 155/99 - - - - -  05/02/18 0854 (!) 157/110 - - - - -  05/02/18 0846 (!) 174/114 - - 83 - -  05/02/18 0821 (!) 165/105 98.7 F (37.1 C) Oral 72 18 98 %  05/02/18 0613 (!) 148/84 98.5 F (36.9 C) Oral 66 18 98 %  05/01/18 2335 (!) 158/95 - - 69 20 -  05/01/18 2321 (!) 160/96 98.7 F (37.1 C) Oral 70 18 100 %  05/01/18 2000 (!) 157/98 - - 72 19 97 %  05/01/18 1623 (!) 154/99 98.6 F (37 C) Oral 82 20 99 %   D/w her rationale for BP control, including MI, stroke, death and that CV/BP is leading cause of maternal death in the [place]. I told her that PO meds are not optimal b/c they are harder to titrate and that IV is recommended. She still refuses IV and she states that she needs to go home b/c no one is at home to take care of her kids. I told her that I do not recommend going home today and that if she desires to leave it would have to be AMA. Pt understands this and will think about staying. Will start with procardia 20mg  PO x 1 and recheck bp and d/c her enalapril and keep the hctz and potentially start procardia xl later today if she responds okay to the PO procardia  Cornelia Copaharlie Alvetta Hidrogo, Jr MD Attending Center for [facility] (Faculty Practice) 05/02/2018 Time: [phone]

## 2018-05-04 ENCOUNTER — Ambulatory Visit: Payer: Self-pay

## 2018-05-09 ENCOUNTER — Other Ambulatory Visit: Payer: Self-pay

## 2018-05-09 ENCOUNTER — Encounter: Payer: Self-pay | Admitting: Obstetrics & Gynecology

## 2018-05-09 ENCOUNTER — Ambulatory Visit: Payer: Self-pay

## 2018-05-23 NOTE — Discharge Summary (Signed)
Obstetrics Discharge Summary Date of Admission: 04/27/2018 Patient left AMA on 05/02/2018  S/p vaginal delivery on 11/22 after admission for IOL for cHTN with superimposed severe pre-eclampsia. Patient also with DM2 on insulin. Patient had uncomplicated vaginal delivery. Her BP medications needed further titration postpartum but patient declined further inpatient stay and signed out postpartum   Patient told to please take her hctz daily and her procardia xl 30mg  bid. Message sent to clinic for 1wk bp check   Cheryl Curry, Jr. MD Attending Center for [facility](Faculty Practice)

## 2018-05-25 ENCOUNTER — Telehealth: Payer: Self-pay | Admitting: *Deleted

## 2018-05-25 NOTE — Telephone Encounter (Signed)
Cheryl HeckDanielle called and left a message this am that she is calling about nexplanon- wants a call back to see if can get that when she comes to her appointment on Friday. I called Cheryl Curry and we discussed that her appointment is for a postpartum visit and we can do a nexplanon at that visit as long as we confirm she is not pregnant and has active insurance. She states she has not resumed sexual activity yet - waiting on postpartum. No other  Questions.

## 2018-05-27 ENCOUNTER — Encounter: Payer: Self-pay | Admitting: Student

## 2018-05-27 ENCOUNTER — Ambulatory Visit (INDEPENDENT_AMBULATORY_CARE_PROVIDER_SITE_OTHER): Payer: Medicaid Other | Admitting: Student

## 2018-05-27 DIAGNOSIS — Z30017 Encounter for initial prescription of implantable subdermal contraceptive: Secondary | ICD-10-CM | POA: Insufficient documentation

## 2018-05-27 DIAGNOSIS — Z3046 Encounter for surveillance of implantable subdermal contraceptive: Secondary | ICD-10-CM

## 2018-05-27 DIAGNOSIS — Z1389 Encounter for screening for other disorder: Secondary | ICD-10-CM

## 2018-05-27 LAB — POCT PREGNANCY, URINE: Preg Test, Ur: NEGATIVE

## 2018-05-27 MED ORDER — NIFEDIPINE ER OSMOTIC RELEASE 30 MG PO TB24: 30.0000 mg | ORAL_TABLET | Freq: Two times a day (BID) | ORAL | 0 refills | Status: DC

## 2018-05-27 NOTE — Patient Instructions (Signed)
Nifedipine capsules What is this medicine? NIFEDIPINE (nye FED i peen) is a calcium-channel blocker. It affects the amount of calcium found in your heart and muscle cells. This relaxes your blood vessels, which can reduce the amount of work the heart has to do. This medicine is used to treat chest pain caused by angina. This medicine may be used for other purposes; ask your health care provider or pharmacist if you have questions. COMMON BRAND NAME(S): Adalat, Procardia What should I tell my health care provider before I take this medicine? They need to know if you have any of these conditions: -heart problems, low blood pressure, slow or irregular heartbeat -kidney disease -liver disease -previous heart attack -an unusual or allergic reaction to nifedipine, other medicines, foods, dyes, or preservatives -pregnant or trying to get pregnant -breast-feeding How should I use this medicine? Take this medicine by mouth with a glass of water. Follow the directions on the prescription label. Swallow whole. Take your doses at regular intervals. Do not take your medicine more often then directed. Do not suddenly stop taking this medicine. Your doctor will tell you how much medicine to take. If your doctor wants you to stop the medicine, the dose will be slowly lowered over time to avoid any side effects. Talk to your pediatrician regarding the use of this medicine in children. Special care may be needed. Overdosage: If you think you have taken too much of this medicine contact a poison control center or emergency room at once. NOTE: This medicine is only for you. Do not share this medicine with others. What if I miss a dose? If you miss a dose, take it as soon as you can. If it is almost time for your next dose, take only that dose. Do not take double or extra doses. What may interact with this medicine? Do not take this medicine with any of the following medications: -certain medicines for seizures  like carbamazepine, phenobarbital, phenytoin -lumacaftor; ivacaftor -rifabutin -rifampin -rifapentine -St. John's Wort This medicine may also interact with the following medications: -antiviral medicines for HIV or AIDS -certain medicines for blood pressure -certain medicines for diabetes -certain medicines for erectile dysfunction -certain medicines for fungal infections like ketoconazole, fluconazole, and itraconazole -certain medicines for irregular heart beat like flecainide and quinidine -certain medicines that treat or prevent blood clots like warfarin -clarithromycin -digoxin -dolasetron -erythromycin -fluoxetine -grapefruit juice -local or general anesthetics -nefazodone -orlistat -quinupristin; dalfopristin -sirolimus -stomach acid blockers like cimetidine, ranitidine, omeprazole, or pantoprazole -tacrolimus -valproic acid This list may not describe all possible interactions. Give your health care provider a list of all the medicines, herbs, non-prescription drugs, or dietary supplements you use. Also tell them if you smoke, drink alcohol, or use illegal drugs. Some items may interact with your medicine. What should I watch for while using this medicine? Visit your doctor or health care professional for regular check ups. Check your blood pressure and pulse rate regularly. Ask your doctor or health care professional what your blood pressure and pulse rate should be and when you should contact him or her. You may get drowsy or dizzy. Do not drive, use machinery, or do anything that needs mental alertness until you know how this medicine affects you. Do not stand or sit up quickly, especially if you are an older patient. This reduces the risk of dizzy or fainting spells. Alcohol may interfere with the effect of this medicine. Avoid alcoholic drinks. What side effects may I notice from receiving this medicine?  Side effects that you should report to your doctor or health care  professional as soon as possible: -blood in the urine -difficulty breathing -fast heartbeat, palpitations, irregular heartbeat, chest pain -redness, blistering, peeling or loosening of the skin, including inside the mouth -reduced amount of urine passed -skin rash -swelling of the legs and ankles Side effects that usually do not require medical attention (report to your doctor or health care professional if they continue or are bothersome): -constipation -facial flushing -headache -weakness or tiredness This list may not describe all possible side effects. Call your doctor for medical advice about side effects. You may report side effects to FDA at 1-800-FDA-1088. Where should I keep my medicine? Keep out of the reach of children. Store at room temperature between 15 and 25 degrees C (59 and 77 degrees F). Protect from light and moisture. Keep container tightly closed. Throw away any unused medicine after the expiration date. NOTE: This sheet is a summary. It may not cover all possible information. If you have questions about this medicine, talk to your doctor, pharmacist, or health care provider.  2019 Elsevier/Gold Standard (2014-07-30 10:24:24)

## 2018-05-27 NOTE — Progress Notes (Signed)
Subjective:     Cheryl Curry is a 32 y.o. female who presents for a postpartum visit. She is 4 weeks postpartum following a spontaneous vaginal delivery. I have fully reviewed the prenatal and intrapartum course. The delivery was at 36 gestational weeks. Outcome: spontaneous vaginal delivery. Anesthesia: epidural. Postpartum course has been uncomplicated as patient has been complaint with antihypertensive medications for [facility]. Baby's course has been uncomplicated. Baby is feeding by both breast and bottle - Daron OfferGerber Goodstart. Bleeding no bleeding. Bowel function is normal. Bladder function is normal. Patient is not sexually active. Contraception method is Nexplanon. Postpartum depression screening: negative.  The following portions of the patient's history were reviewed and updated as appropriate: allergies, current medications, past family history, past medical history, past social history, past surgical history and problem list.  Review of Systems Pertinent items are noted in HPI.   Objective:    Wt 198 lb 14.4 oz (90.2 kg)   LMP 08/08/2017 (Exact Date)   BMI 32.10 kg/m    Physical Exam Vitals signs reviewed. Exam conducted with a chaperone present.  Constitutional:      Appearance: Normal appearance.  HENT:     Head: Normocephalic and atraumatic.  Eyes:     Conjunctiva/sclera: Conjunctivae normal.     Pupils: Pupils are equal, round, and reactive to light.  Neck:     Musculoskeletal: Normal range of motion.  Cardiovascular:     Rate and Rhythm: Normal rate and regular rhythm.     Heart sounds: Normal heart sounds.  Pulmonary:     Effort: Pulmonary effort is normal.     Breath sounds: Normal breath sounds.  Genitourinary:    Comments: Deferred Musculoskeletal: Normal range of motion.  Skin:    General: Skin is warm and dry.  Neurological:     Mental Status: She is alert and oriented to person, place, and time.  Psychiatric:        Mood and Affect: Mood normal.         Thought Content: Thought content normal.      Assessment:  4 week PP Visit Desires Nexplanon CHTN Breastfeeding Supplementing Pap UTD  Plan:   -Okay to return to normal activities and work as desired. -Nexplanon inserted today w/o issues.  -Discussed follow up with PCP for continuation or modification of antihypertensives. -Discussed plan to continue Procardia 30xL for next 30 days or until evaluated by PCP. -Encouraged to take PNV until discontinuation of breastfeeding. -No other q/c today. -Encouraged RTC in one year for annual exam or prn as necessary.   Cherre RobinsJessica L Makaylynn Bonillas MSN, CNM 05/27/2018 11:13 AM

## 2018-06-10 ENCOUNTER — Encounter: Payer: Self-pay | Admitting: General Practice

## 2018-06-22 MED ORDER — ETONOGESTREL 68 MG ~~LOC~~ IMPL
68.0000 mg | DRUG_IMPLANT | Freq: Once | SUBCUTANEOUS | Status: AC
  Administered 2018-05-27: 68 mg via SUBCUTANEOUS

## 2018-06-22 NOTE — Addendum Note (Signed)
Addended by: Gwendlyn Deutscher A on: 06/22/2018 09:15 AM   Modules accepted: Orders

## 2018-08-04 ENCOUNTER — Encounter (HOSPITAL_COMMUNITY): Payer: Self-pay

## 2018-08-04 ENCOUNTER — Ambulatory Visit (HOSPITAL_COMMUNITY)
Admission: EM | Admit: 2018-08-04 | Discharge: 2018-08-04 | Disposition: A | Discharge status: Home / Self Care | Payer: Medicaid Other | Attending: Family Medicine | Admitting: Family Medicine

## 2018-08-04 DIAGNOSIS — E119 Type 2 diabetes mellitus without complications: Secondary | ICD-10-CM

## 2018-08-04 DIAGNOSIS — R350 Frequency of micturition: Secondary | ICD-10-CM | POA: Diagnosis present

## 2018-08-04 DIAGNOSIS — I10 Essential (primary) hypertension: Secondary | ICD-10-CM

## 2018-08-04 DIAGNOSIS — Z3202 Encounter for pregnancy test, result negative: Secondary | ICD-10-CM

## 2018-08-04 LAB — POCT URINALYSIS DIP (DEVICE)
BILIRUBIN URINE: NEGATIVE
Ketones, ur: NEGATIVE mg/dL
LEUKOCYTE UA: NEGATIVE
Nitrite: NEGATIVE
Protein, ur: NEGATIVE mg/dL
SPECIFIC GRAVITY, URINE: 1.015 (ref 1.005–1.030)
UROBILINOGEN UA: 0.2 mg/dL (ref 0.0–1.0)
pH: 6.5 (ref 5.0–8.0)

## 2018-08-04 LAB — POCT PREGNANCY, URINE: Preg Test, Ur: NEGATIVE

## 2018-08-04 MED ORDER — FLUCONAZOLE 150 MG PO TABS: 150.0000 mg | ORAL_TABLET | Freq: Once | ORAL | 0 refills | Status: AC

## 2018-08-04 NOTE — ED Triage Notes (Signed)
Pt present some frequency urinating with some burning afterwards.  Symptoms started over a week ago.

## 2018-08-04 NOTE — ED Provider Notes (Signed)
[facility]    CSN: [id number] Arrival date & time: 08/04/18  1600     History   Chief Complaint Chief Complaint  Patient presents with  . Urinary Tract Infection    HPI Cheryl Curry is a 33 y.o. female history of DM type II, hypertension presenting today for evaluation of urinary frequency.  Patient states that she has urinary frequency off and on, she has noticed her symptoms more prominently over the past week.  She has occasional dysuria.  Denies any hematuria.  Denies vaginal discharge.  Notes that frequently it is more related to her sugar.  She notes that her insurance has not recently been covering her diabetes and high blood pressure medicines and so she has not been on these.  She continues to take metformin daily.  Does have a primary care.  Denies fever, nausea vomiting or abdominal pain.  HPI  Past Medical History:  Diagnosis Date  . Diabetes mellitus without complication (HCC)   . Hypertension   . Pregnancy induced hypertension     Patient Active Problem List   Diagnosis Date Noted  . Routine postpartum follow-up 05/27/2018  . Encounter for insertion subdermal contraceptive 05/27/2018  . Chronic hypertension affecting pregnancy 04/27/2018  . Chronic hypertension with superimposed preeclampsia 04/27/2018  . LGA (large for gestational age) fetus affecting management of mother 04/25/2018  . Fetal cardiac anomaly affecting pregnancy, antepartum 03/22/2018  . Obesity in pregnancy 11/08/2017  . GBS bacteriuria 11/04/2017  . Supervision of high risk pregnancy, antepartum 10/28/2017  . Medication exposure during first trimester of pregnancy 10/28/2017  . Chronic hypertension during pregnancy, antepartum 10/28/2017  . DM2 in pregnancy 10/21/2017    Past Surgical History:  Procedure Laterality Date  . NO PAST SURGERIES      OB History    Gravida  3   Para  3   Term  2   Preterm  1   AB  0   Living  3     SAB      TAB       Ectopic      Multiple  0   Live Births  3            Home Medications    Prior to Admission medications   Medication Sig Start Date End Date Taking? Authorizing Provider  ACCU-CHEK FASTCLIX LANCETS MISC 1 Device by Percutaneous route 4 (four) times daily. Patient not taking: Reported on 05/27/2018 11/08/17   Allie Bossierove, Myra C, MD  acetaminophen (TYLENOL) 500 MG tablet Take 1,000 mg by mouth every 6 (six) hours as needed for mild pain.    [provider]  aspirin EC 81 MG tablet Take 1 tablet (81 mg total) by mouth daily. Patient not taking: Reported on 05/27/2018 11/18/17   Isola BingPickens, Charlie, MD  fluconazole (DIFLUCAN) 150 MG tablet Take 1 tablet (150 mg total) by mouth once for 1 dose. 08/04/18 08/04/18  Monta Maiorana C, PA-C  folic acid (FOLVITE) 1 MG tablet Take 1 tablet (1 mg total) by mouth daily. 10/28/17   [place] BingPickens, Charlie, MD  glucose blood test strip Use as instructed Patient not taking: Reported on 05/27/2018 11/08/17   Allie Bossierove, Myra C, MD  insulin aspart (NOVOLOG) 100 UNIT/ML injection 30 units with breakfast; 20 units with lunch and 30 units with dinner Patient not taking: Reported on 05/27/2018 04/18/18   Reva BoresPratt, Tanya S, MD  insulin NPH Human (HUMULIN N,NOVOLIN N) 100 UNIT/ML injection 46 units in the morning and 46  units at bedtime Patient not taking: Reported on 05/27/2018 04/18/18   Reva Bores, MD  Insulin Syringe-Needle U-100 (INSULIN SYRINGE 1CC/31GX5/16") 31G X 5/16" 1 ML MISC 1 Units by Does not apply route as directed. Patient not taking: Reported on 05/27/2018 11/29/17   Reva Bores, MD  labetalol (NORMODYNE) 200 MG tablet Take 3 tablets (600 mg total) by mouth 3 (three) times daily. Patient not taking: Reported on 05/27/2018 04/25/18   Donette Larry, CNM  metFORMIN (GLUCOPHAGE) 500 MG tablet Take 1 tablet (500 mg total) by mouth 2 (two) times daily with a meal. Patient not taking: Reported on 04/27/2018 04/11/18   Levie Heritage, DO  NIFEdipine  (PROCARDIA-XL/NIFEDICAL-XL) 30 MG 24 hr tablet Take 1 tablet (30 mg total) by mouth 2 (two) times daily. 05/27/18   Gerrit Heck, CNM  Prenatal Vit-Fe Fumarate-FA (MULTIVITAMIN-PRENATAL) 27-0.8 MG TABS tablet Take 1 tablet by mouth daily at 12 noon. 11/29/17   Reva Bores, MD    Family History History reviewed. No pertinent family history.  Social History Social History   Tobacco Use  . Smoking status: Never Smoker  . Smokeless tobacco: Never Used  Substance Use Topics  . Alcohol use: Not Currently    Comment: occassional  . Drug use: No     Allergies   Patient has no known allergies.   Review of Systems Review of Systems  Constitutional: Negative for fever.  Respiratory: Negative for shortness of breath.   Cardiovascular: Negative for chest pain.  Gastrointestinal: Negative for abdominal pain, diarrhea, nausea and vomiting.  Genitourinary: Positive for dysuria and frequency. Negative for flank pain, genital sores, hematuria, menstrual problem, vaginal bleeding, vaginal discharge and vaginal pain.  Musculoskeletal: Negative for back pain.  Skin: Negative for rash.  Neurological: Negative for dizziness, light-headedness and headaches.     Physical Exam Triage Vital Signs ED Triage Vitals  Enc Vitals Group     BP 08/04/18 1731 (!) 147/107     Pulse Rate 08/04/18 1731 98     Resp 08/04/18 1731 16     Temp 08/04/18 1731 98.6 F (37 C)     Temp Source 08/04/18 1731 Oral     SpO2 08/04/18 1731 99 %     Weight --      Height --      Head Circumference --      Peak Flow --      Pain Score 08/04/18 1730 0     Pain Loc --      Pain Edu? --      Excl. in [place]? --    No data found.  Updated Vital Signs BP (!) 147/107 (BP Location: Right Arm)   Pulse 98   Temp 98.6 F (37 C) (Oral)   Resp 16   SpO2 99%   Visual Acuity Right Eye Distance:   Left Eye Distance:   Bilateral Distance:    Right Eye Near:   Left Eye Near:    Bilateral Near:     Physical  Exam Vitals signs and nursing note reviewed.  Constitutional:      Appearance: She is well-developed.     Comments: No acute distress  HENT:     Head: Normocephalic and atraumatic.     Nose: Nose normal.  Eyes:     Conjunctiva/sclera: Conjunctivae normal.  Neck:     Musculoskeletal: Neck supple.  Cardiovascular:     Rate and Rhythm: Normal rate.  Pulmonary:     Effort: Pulmonary effort  is normal. No respiratory distress.  Abdominal:     General: There is no distension.  Musculoskeletal: Normal range of motion.  Skin:    General: Skin is warm and dry.  Neurological:     Mental Status: She is alert and oriented to person, place, and time.      UC Treatments / Results  Labs (all labs ordered are listed, but only abnormal results are displayed) Labs Reviewed  POCT URINALYSIS DIP (DEVICE) - Abnormal; Notable for the following components:      Result Value   Glucose, UA >=1000 (*)    Hgb urine dipstick TRACE (*)    All other components within normal limits  URINE CULTURE  POC URINE PREG, ED  POCT PREGNANCY, URINE    EKG None  Radiology No results found.  Procedures Procedures (including critical care time)  Medications Ordered in UC Medications - No data to display  Initial Impression / Assessment and Plan / UC Course  I have reviewed the triage vital signs and the nursing notes.  Pertinent labs & imaging results that were available during my care of the patient were reviewed by me and considered in my medical decision making (see chart for details).     Urinary frequency likely related to spilling of glucose into urine, greater than 1000.  Likely diabetes uncontrolled.  Patient does not have symptoms of DKA at this time, discussed having follow-up with primary care soon in order to back on medicines, provided good Rx card.  Provided Diflucan to use if having itching and symptoms concerning for yeast.  Continue to monitor her blood sugars and follow-up if  developing signs of dehydration.Discussed strict return precautions. Patient verbalized understanding and is agreeable with plan.  Final Clinical Impressions(s) / UC Diagnoses   Final diagnoses:  Urinary frequency     Discharge Instructions     There is no sign of bacteria in your urine today, the urinary frequency is likely from uncontrolled diabetes as there is a lot of glucose in your urine  Please continue to take the metformin as prescribed, follow-up with your primary care for further management of your diabetes and blood pressure  Please try to attempt to use good Rx card to help with cost of medicines that you have been unable to obtain   ED Prescriptions    Medication Sig Dispense Auth. Provider   fluconazole (DIFLUCAN) 150 MG tablet Take 1 tablet (150 mg total) by mouth once for 1 dose. 2 tablet Jordan Caraveo C, PA-C     Controlled Substance Prescriptions [place] Controlled Substance Registry consulted? Not Applicable   Lew Dawes, [place] 08/04/18 1818

## 2018-08-04 NOTE — Discharge Instructions (Signed)
There is no sign of bacteria in your urine today, the urinary frequency is likely from uncontrolled diabetes as there is a lot of glucose in your urine  Please continue to take the metformin as prescribed, follow-up with your primary care for further management of your diabetes and blood pressure  Please try to attempt to use good Rx card to help with cost of medicines that you have been unable to obtain

## 2018-08-06 ENCOUNTER — Telehealth (HOSPITAL_COMMUNITY): Payer: Self-pay | Admitting: Emergency Medicine

## 2018-08-06 LAB — URINE CULTURE: Culture: >=100000 — AB

## 2018-08-06 MED ORDER — NITROFURANTOIN MONOHYD MACRO 100 MG PO CAPS: 100.0000 mg | ORAL_CAPSULE | Freq: Two times a day (BID) | ORAL | 0 refills | Status: DC

## 2018-08-06 NOTE — Telephone Encounter (Signed)
Not treated. Called patient, will send in nitrofurantoin. All questions answere.

## 2018-08-24 ENCOUNTER — Telehealth: Payer: Self-pay

## 2018-08-24 MED ORDER — MEDROXYPROGESTERONE ACETATE 10 MG PO TABS: 10.0000 mg | ORAL_TABLET | Freq: Every day | ORAL | 0 refills | Status: DC

## 2018-08-24 NOTE — Telephone Encounter (Signed)
Pt called stating that she has a Nexplanon placed and she has been bleeding for long period of time.  Can she have something prescribed to stop the bleeding?  Called pt and confirmed with pt how often she is changing a pad.  Pt reports changing a pad every 4 hours.  Notified Dr. Debroah Loop who recommends pt Provera 10 mg po daily for 30 days. Pt notified of providers recommendation.  Pt verbalizes understanding with no further questions.

## 2019-03-01 ENCOUNTER — Other Ambulatory Visit: Payer: Self-pay

## 2019-03-01 ENCOUNTER — Encounter (HOSPITAL_COMMUNITY): Payer: Self-pay | Admitting: Family Medicine

## 2019-03-01 ENCOUNTER — Ambulatory Visit (HOSPITAL_COMMUNITY)
Admission: EM | Admit: 2019-03-01 | Discharge: 2019-03-01 | Disposition: A | Discharge status: Home / Self Care | Payer: Medicaid Other | Attending: Family Medicine | Admitting: Family Medicine

## 2019-03-01 DIAGNOSIS — I10 Essential (primary) hypertension: Secondary | ICD-10-CM | POA: Diagnosis not present

## 2019-03-01 DIAGNOSIS — E119 Type 2 diabetes mellitus without complications: Secondary | ICD-10-CM

## 2019-03-01 LAB — COMPREHENSIVE METABOLIC PANEL
ALT: 20 U/L (ref 0–44)
AST: 25 U/L (ref 15–41)
Albumin: 4 g/dL (ref 3.5–5.0)
Alkaline Phosphatase: 73 U/L (ref 38–126)
Anion gap: 13 (ref 5–15)
BUN: 9 mg/dL (ref 6–20)
CO2: 22 mmol/L (ref 22–32)
Calcium: 9.4 mg/dL (ref 8.9–10.3)
Chloride: 99 mmol/L (ref 98–111)
Creatinine, Ser: 0.74 mg/dL (ref 0.44–1.00)
GFR calc Af Amer: >60 mL/min (ref >60)
GFR calc non Af Amer: >60 mL/min (ref >60)
Glucose, Bld: 310 mg/dL — ABNORMAL HIGH (ref 70–99)
Potassium: 4.3 mmol/L (ref 3.5–5.1)
Sodium: 134 mmol/L — ABNORMAL LOW (ref 135–145)
Total Bilirubin: 0.7 mg/dL (ref 0.3–1.2)
Total Protein: 7.4 g/dL (ref 6.5–8.1)

## 2019-03-01 LAB — POCT URINALYSIS DIP (DEVICE)
Bilirubin Urine: NEGATIVE
Glucose, UA: 500 mg/dL — AB
Hgb urine dipstick: NEGATIVE
Leukocytes,Ua: NEGATIVE
Nitrite: NEGATIVE
Protein, ur: NEGATIVE mg/dL
Specific Gravity, Urine: 1.02 (ref 1.005–1.030)
Urobilinogen, UA: 0.2 mg/dL (ref 0.0–1.0)
pH: 7 (ref 5.0–8.0)

## 2019-03-01 MED ORDER — LISINOPRIL 20 MG PO TABS: 20.0000 mg | ORAL_TABLET | Freq: Every day | ORAL | 3 refills | Status: DC

## 2019-03-01 MED ORDER — AMLODIPINE BESYLATE 5 MG PO TABS: 5.0000 mg | ORAL_TABLET | Freq: Every day | ORAL | 3 refills | Status: DC

## 2019-03-01 MED ORDER — METFORMIN HCL 500 MG PO TABS: 500.0000 mg | ORAL_TABLET | Freq: Two times a day (BID) | ORAL | 3 refills | Status: DC

## 2019-03-01 NOTE — ED Triage Notes (Addendum)
Pt presents to [facility] for assessment of elevated BP on home machine of 203/140.  Patient states PCP is closed today and unable to see her for medication refills until October.  States she has been out of her medicines x 1 week at this time.  C/o headache, denies weakness, denies dizziness.   Also c/o cloudy urine.

## 2019-03-01 NOTE — ED Provider Notes (Signed)
[facility]    CSN: [id number] Arrival date & time: 03/01/19  1024      History   Chief Complaint Chief Complaint  Patient presents with  . Dysuria  . Hypertension    HPI Cheryl Curry is a 33 y.o. female.   This is an established [facility] urgent care patient who presents with urinary tract infection symptoms.  She ran out of her blood pressure medicine a month ago.  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  Patient has had some intermittent headaches and transient sharp pains in her legs and feet in the last month.  Her graph she is having no shortness of breath or chest pain, nausea, vomiting, visual disturbance.  Patient states that she has no dysuria or frequency but that she does have cloudy urine.     Past Medical History:  Diagnosis Date  . Diabetes mellitus without complication (HCC)   . Hypertension   . Pregnancy induced hypertension     Patient Active Problem List   Diagnosis Date Noted  . Routine postpartum follow-up 05/27/2018  . Encounter for insertion subdermal contraceptive 05/27/2018  . Chronic hypertension affecting pregnancy 04/27/2018  . Chronic hypertension with superimposed preeclampsia 04/27/2018  . LGA (large for gestational age) fetus affecting management of mother 04/25/2018  . Fetal cardiac anomaly affecting pregnancy, antepartum 03/22/2018  . Obesity in pregnancy 11/08/2017  . GBS bacteriuria 11/04/2017  . Supervision of high risk pregnancy, antepartum 10/28/2017  . Medication exposure during first trimester of pregnancy 10/28/2017  . Chronic hypertension during pregnancy, antepartum 10/28/2017  . DM2 in pregnancy 10/21/2017    Past Surgical History:  Procedure Laterality Date  . NO PAST SURGERIES      OB History    Gravida  3   Para  3   Term  2   Preterm  1   AB  0   Living  3     SAB      TAB      Ectopic      Multiple  0   Live Births  3             Home Medications    Prior to Admission medications   Medication Sig Start Date End Date Taking? Authorizing Provider  acetaminophen (TYLENOL) 500 MG tablet Take 1,000 mg by mouth every 6 (six) hours as needed for mild pain.    [provider]  amLODipine (NORVASC) 5 MG tablet Take 1 tablet (5 mg total) by mouth daily. 03/01/19   Elvina Sidle, MD  lisinopril (ZESTRIL) 20 MG tablet Take 1 tablet (20 mg total) by mouth daily. 03/01/19   Elvina Sidle, MD  metFORMIN (GLUCOPHAGE) 500 MG tablet Take 1 tablet (500 mg total) by mouth 2 (two) times daily with a meal. 03/01/19   Elvina Sidle, MD  insulin aspart (NOVOLOG) 100 UNIT/ML injection 30 units with breakfast; 20 units with lunch and 30 units with dinner Patient not taking: Reported on 05/27/2018 04/18/18 03/01/19  Reva Bores, MD  insulin NPH Human (HUMULIN N,NOVOLIN N) 100 UNIT/ML injection 46 units in the morning and 46 units at bedtime Patient not taking: Reported on 05/27/2018 04/18/18 03/01/19  Reva Bores, MD  labetalol (NORMODYNE) 200 MG tablet Take 3 tablets (600 mg total) by mouth 3 (three) times daily. Patient not taking: Reported on 05/27/2018 04/25/18 03/01/19  Donette Larry, CNM  medroxyPROGESTERone (PROVERA) 10 MG tablet Take 1 tablet (10 mg  total) by mouth daily. 08/24/18 03/01/19  Woodroe Mode, MD  NIFEdipine (PROCARDIA-XL/NIFEDICAL-XL) 30 MG 24 hr tablet Take 1 tablet (30 mg total) by mouth 2 (two) times daily. 05/27/18 03/01/19  Gavin Pound, CNM    Family History Family History  Problem Relation Age of Onset  . Hypertension Mother   . Hypertension Father     Social History Social History   Tobacco Use  . Smoking status: Never Smoker  . Smokeless tobacco: Never Used  Substance Use Topics  . Alcohol use: Not Currently    Comment: occassional  . Drug use: No     Allergies   Patient has no known allergies.   Review of Systems Review of Systems  All other systems reviewed and are  negative.    Physical Exam Triage Vital Signs ED Triage Vitals  Enc Vitals Group     BP      Pulse      Resp      Temp      Temp src      SpO2      Weight      Height      Head Circumference      Peak Flow      Pain Score      Pain Loc      Pain Edu?      Excl. in [place]?    No data found.  Updated Vital Signs BP (!) 162/115 (BP Location: Right Arm)   Pulse 74   Temp 98.3 F (36.8 C) (Oral)   Resp 16   SpO2 100%    Physical Exam Vitals signs and nursing note reviewed.  Constitutional:      General: She is not in acute distress.    Appearance: Normal appearance. She is normal weight. She is not ill-appearing.  HENT:     Head: Normocephalic.  Eyes:     Conjunctiva/sclera: Conjunctivae normal.  Neck:     Musculoskeletal: Normal range of motion and neck supple.  Cardiovascular:     Rate and Rhythm: Normal rate and regular rhythm.     Heart sounds: Normal heart sounds.  Pulmonary:     Effort: Pulmonary effort is normal.     Breath sounds: Normal breath sounds.  Musculoskeletal: Normal range of motion.  Skin:    General: Skin is warm.  Neurological:     General: No focal deficit present.     Mental Status: She is alert and oriented to person, place, and time.  Psychiatric:        Mood and Affect: Mood normal.        Behavior: Behavior normal.      UC Treatments / Results  Labs (all labs ordered are listed, but only abnormal results are displayed) Labs Reviewed  POCT URINALYSIS DIP (DEVICE) - Abnormal; Notable for the following components:      Result Value   Glucose, UA 500 (*)    Ketones, ur TRACE (*)    All other components within normal limits  HEMOGLOBIN A1C  COMPREHENSIVE METABOLIC PANEL    EKG   Radiology No results found.  Procedures Procedures (including critical care time)  Medications Ordered in UC Medications - No data to display  Initial Impression / Assessment and Plan / UC Course  I have reviewed the triage vital signs and  the nursing notes.  Pertinent labs & imaging results that were available during my care of the patient were reviewed by me and considered in my  medical decision making (see chart for details).    Final Clinical Impressions(s) / UC Diagnoses   Final diagnoses:  Essential hypertension  Type 2 diabetes mellitus without complication, without long-term current use of insulin (HCC)     Discharge Instructions     The urine test does not show any infection.  I have refilled your medicine for blood pressure and diabetes.    ED Prescriptions    Medication Sig Dispense Auth. Provider   amLODipine (NORVASC) 5 MG tablet Take 1 tablet (5 mg total) by mouth daily. 90 tablet Elvina Sidle, MD   lisinopril (ZESTRIL) 20 MG tablet Take 1 tablet (20 mg total) by mouth daily. 90 tablet Elvina Sidle, MD   metFORMIN (GLUCOPHAGE) 500 MG tablet Take 1 tablet (500 mg total) by mouth 2 (two) times daily with a meal. 180 tablet Elvina Sidle, MD     I have reviewed the PDMP during this encounter.   Elvina Sidle, MD 03/01/19 1128

## 2019-03-01 NOTE — Discharge Instructions (Signed)
The urine test does not show any infection.  I have refilled your medicine for blood pressure and diabetes.

## 2019-03-02 LAB — HEMOGLOBIN A1C
Hgb A1c MFr Bld: 13.1 % — ABNORMAL HIGH (ref 4.8–5.6)
Mean Plasma Glucose: 329 mg/dL

## 2019-03-03 ENCOUNTER — Telehealth (HOSPITAL_COMMUNITY): Payer: Self-pay | Admitting: Emergency Medicine

## 2019-03-03 NOTE — Telephone Encounter (Signed)
Reviewed labs with Augusto Gamble, pt needs follow up with PCP for recheck. Patient contacted and made aware of    Plan and test results, all questions answered

## 2019-03-27 IMAGING — US US MFM OB FOLLOW-UP
1 series · 13 of 28 positions shown · non-contrast
Comparison: none

[Series 1: us mfm ob follow-up · 13 of 55 slices shown]
[im 3/55]
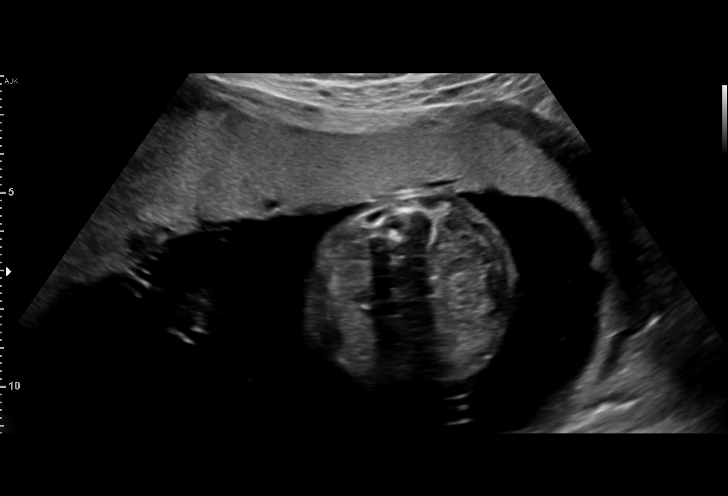
[im 7/55]
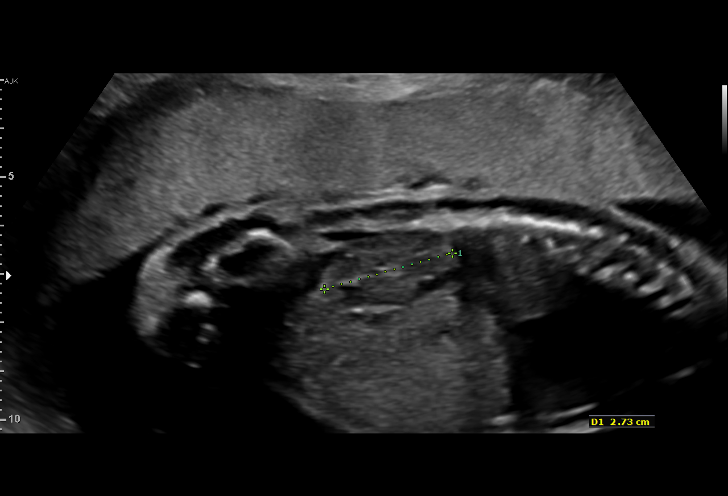
[im 11/55]
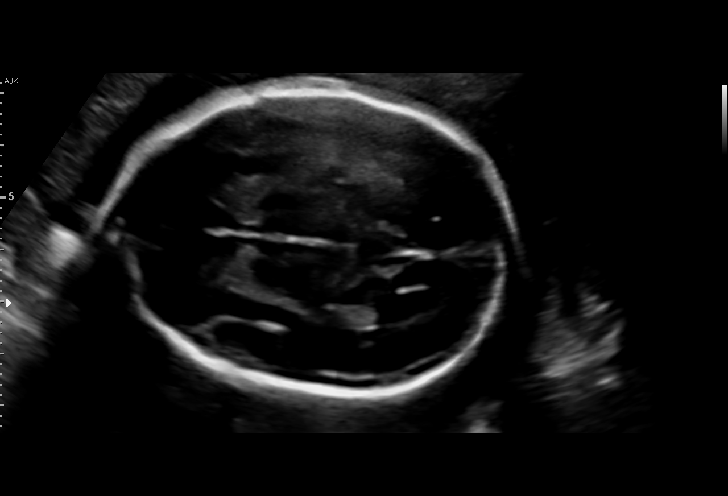
[im 15/55]
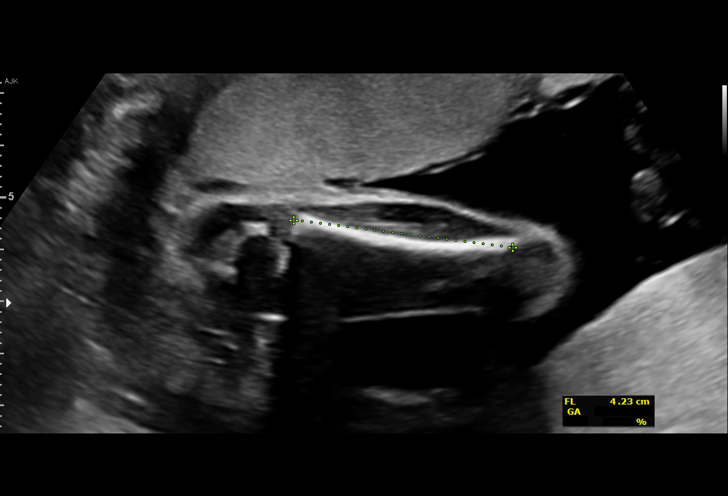
[im 19/55]
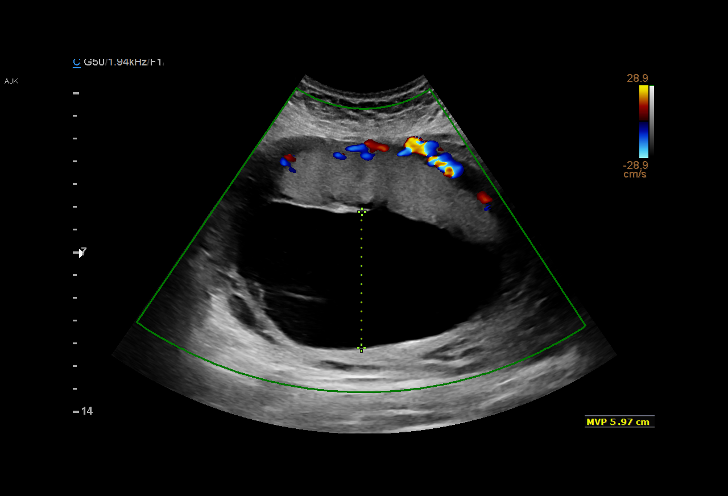
[im 23/55]
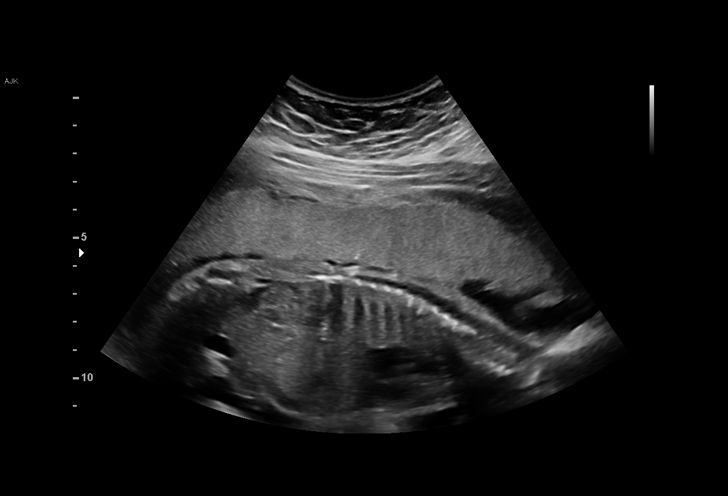
[im 29/55]
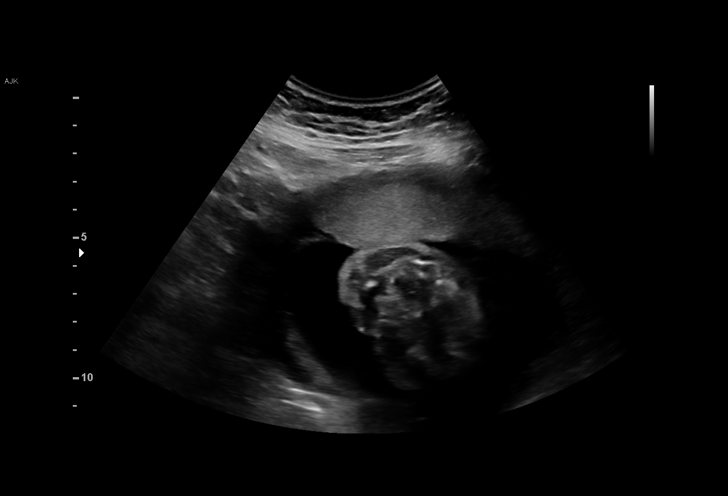
[im 33/55]
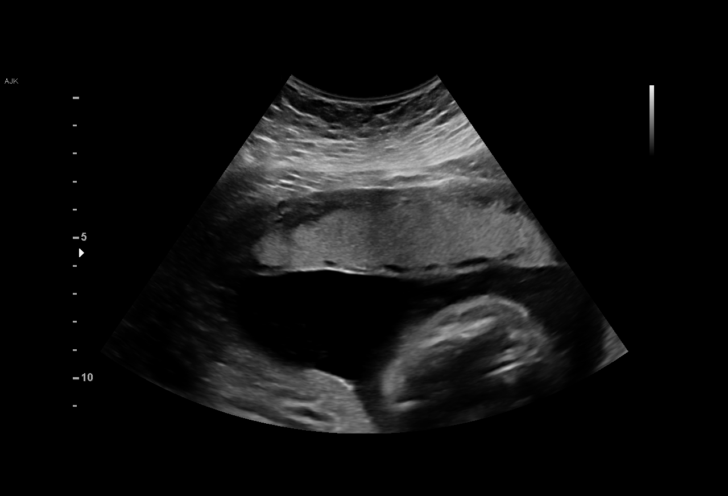
[im 37/55]
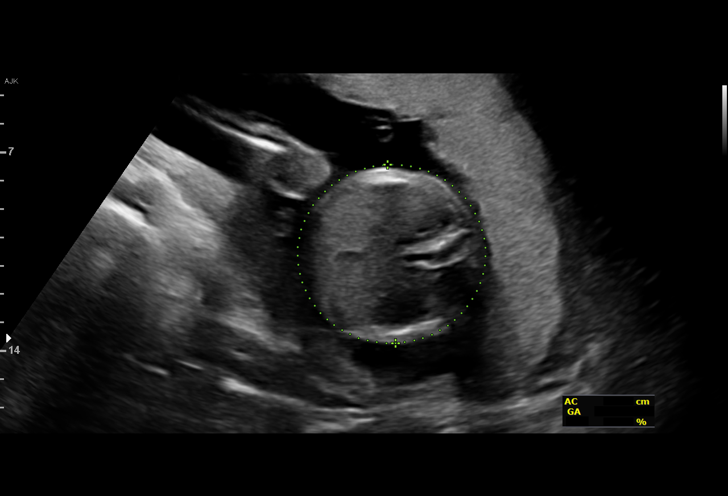
[im 41/55]
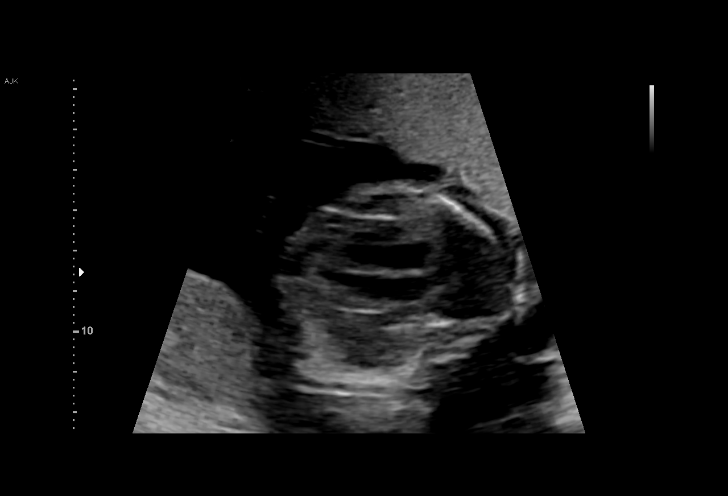
[im 45/55]
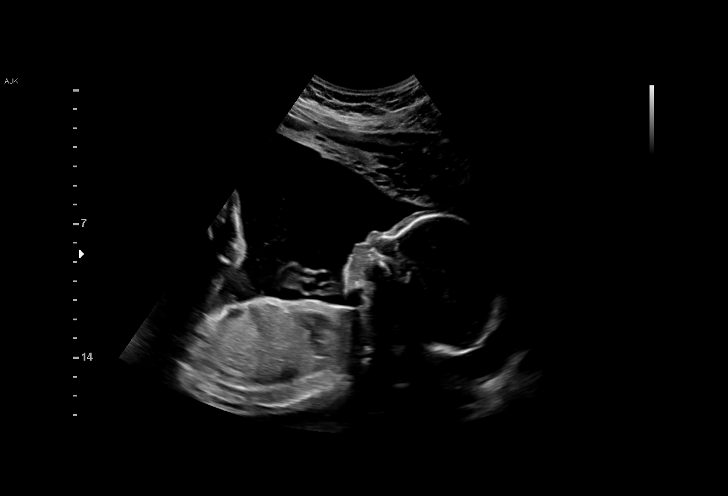
[im 49/55]
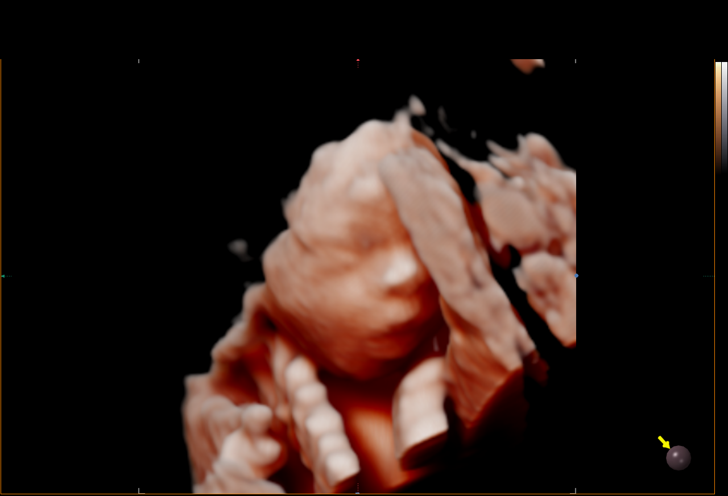
[im 53/55]
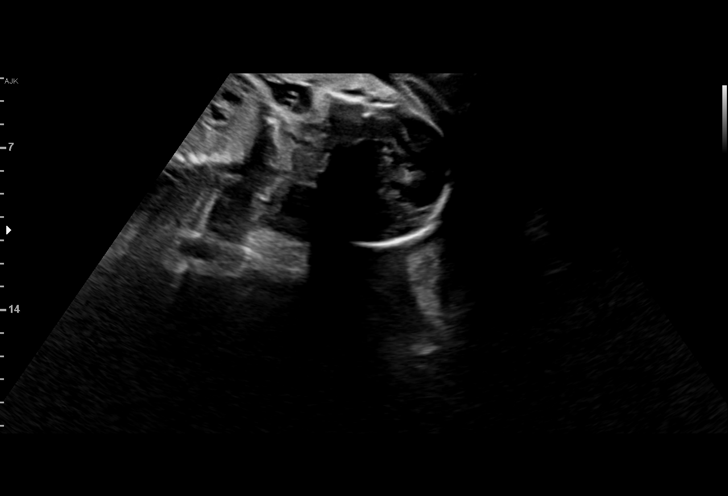

[13 of 28 positions shown; findings below may reference images not displayed]

PINCHBACK


Indications

Pre-existing diabetes, type 2, in pregnancy,
second trimester (insulin- non compliant)
Hypertension - Chronic/Pre-existing (ASA,
labetalol- noncompliant)
Obesity complicating pregnancy, second
trimester (BMI 32.8)
Encounter for other antenatal screening
follow-up
23 weeks gestation of pregnancy
Fetal Evaluation

Num Of Fetuses:         1
Fetal Heart Rate(bpm):  162
Cardiac Activity:       Observed
Presentation:           Cephalic
Placenta:               Anterior
P. Cord Insertion:      Visualized, central

Amniotic Fluid
AFI FV:      Within normal limits

Largest Pocket(cm)
5.97
Biometry

BPD:      56.9  mm     G. Age:  23w 3d         27  %    CI:         71.6   %    70 - 86
FL/HC:      19.9   %    18.7 -
HC:      214.1  mm     G. Age:  23w 4d         20  %    HC/AC:      1.06        1.05 -
AC:      202.2  mm     G. Age:  24w 6d         72  %    FL/BPD:     74.7   %    71 - 87
FL:       42.5  mm     G. Age:  23w 6d         39  %    FL/AC:      21.0   %    20 - 24
HUM:      38.2  mm     G. Age:  23w 4d         34  %

LV:        5.6  mm

Est. FW:     679  gm      1 lb 8 oz     60  %
OB History

Gravidity:    3         Term:   2        Prem:   0        SAB:   0
TOP:          0       Ectopic:  0        Living: 2
Gestational Age

LMP:           25w 2d        Date:  08/08/17                 EDD:   05/15/18
U/S Today:     24w 0d                                        EDD:   05/24/18
Best:          23w 6d     Det. By:  U/S  (12/30/17)          EDD:   05/25/18
Anatomy

Cranium:               Appears normal         Aortic Arch:            Previously seen
Cavum:                 Appears normal         Ductal Arch:            Previously seen
Ventricles:            Appears normal         Diaphragm:              Previously seen
Choroid Plexus:        Previously seen        Stomach:                Appears normal, left
sided
Cerebellum:            Previously seen        Abdomen:                Appears normal
Posterior Fossa:       Previously seen        Abdominal Wall:         Previously seen
Nuchal Fold:           Previously seen        Cord Vessels:           Previously seen
Face:                  Orbits and profile     Kidneys:                Appear normal
previously seen
Lips:                  Previously seen        Bladder:                Appears normal
Thoracic:              Appears normal         Spine:                  Previously seen
Heart:                 Appears normal         Upper Extremities:      Previously seen
(4CH, axis, and situs
RVOT:                  Appears normal         Lower Extremities:      Previously seen
LVOT:                  Previously seen

Other:  Heels and 5th digit previously visualized. Fetus appears to be a
female.
Cervix Uterus Adnexa

Cervix
Length:           4.37  cm.
Normal appearance by transabdominal scan.

Uterus
No abnormality visualized.

Left Ovary
Not visualized.

Right Ovary
Not visualized.

Cul De Sac
No free fluid seen.

Adnexa
No abnormality visualized.
Comments

U/S images reviewed. Findings reviewed with patient.
Appropriate fetal growth is noted.  No fetal abnormalities are
seen.
Patient is not taking her insulin or Labetalol.  BP - Lt arm
145/96 with Rt arm 134/88.   Importance of taking her
precribed medication discussed.
Questions answered.
10 minutes spent face to face.
Recommendations: 1) Patient to see RN in [facility] clinic
today 2) Serial U/S every 4 weeks for fetal growth 3) Weekly
BPP beginning @ 28 weeks 4) Fetal ECHO with Pediatric
Cardiology scheduled for 02/03/18 5) ObBKc to be done by
OB 6) Consider MFM consultation
Recommendations

1) Patient to see RN in [facility] clinic today 2) Serial U/S
every 4 weeks for fetal growth 3) Weekly BPP beginning @
28 weeks 4) Fetal ECHO with Pediatric Cardiology scheduled
for 02/03/18 5) ObBKc to be done by OB 6) Consider MFM
consultation

## 2019-05-22 IMAGING — US US MFM FETAL BPP W/O NON-STRESS
1 series · 13 of 28 positions shown · non-contrast
Comparison: none

[Series 1: us mfm fetal bpp w/o non-stress · 36 acquisitions, 13 frames shown]
[im 2/36]
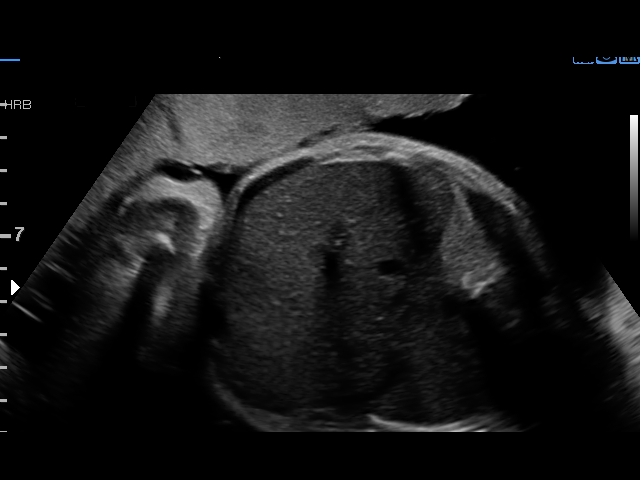
[im 4/36]
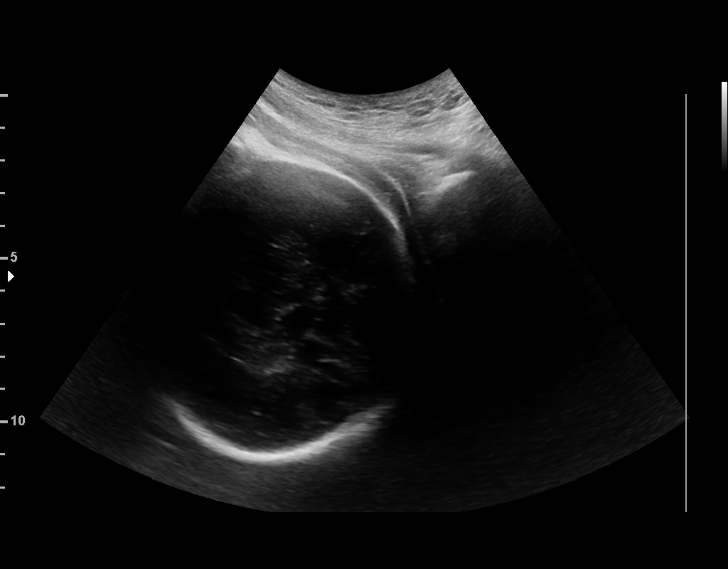
[im 7/36]
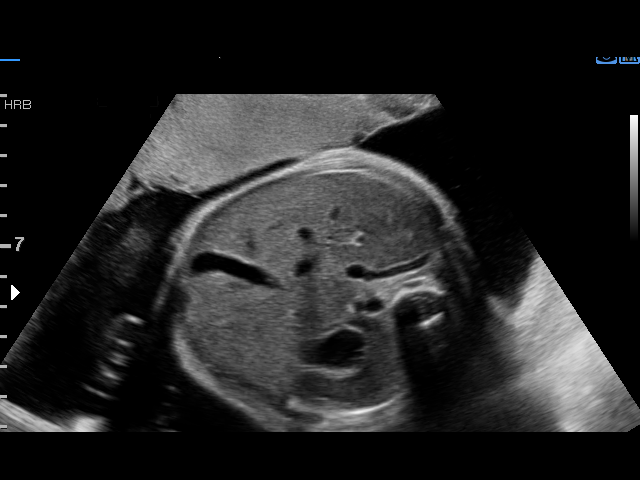
[im 10/36]
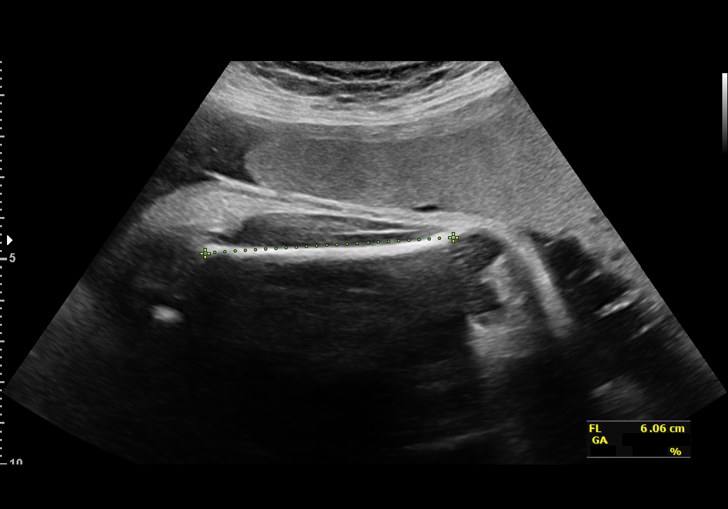
[im 12/36]
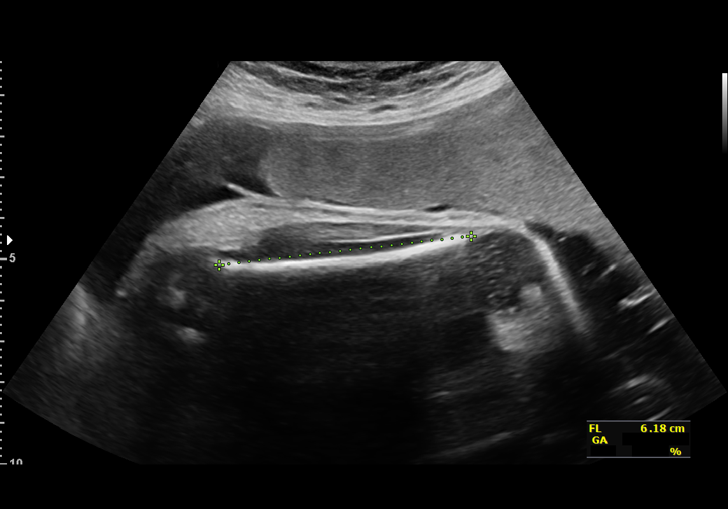
[im 15/36]
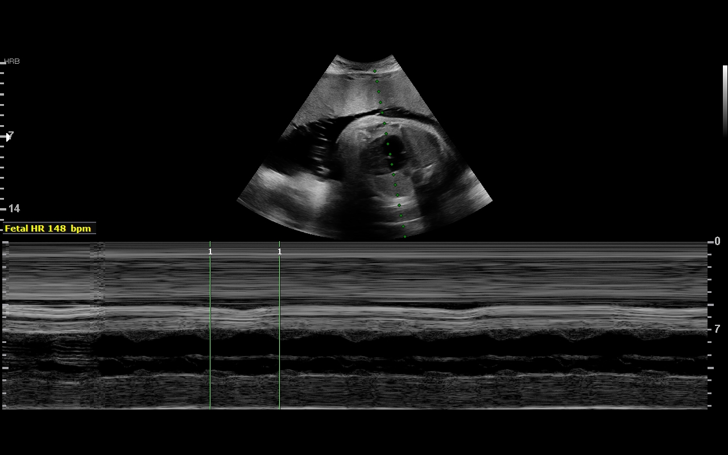
[im 19/36]
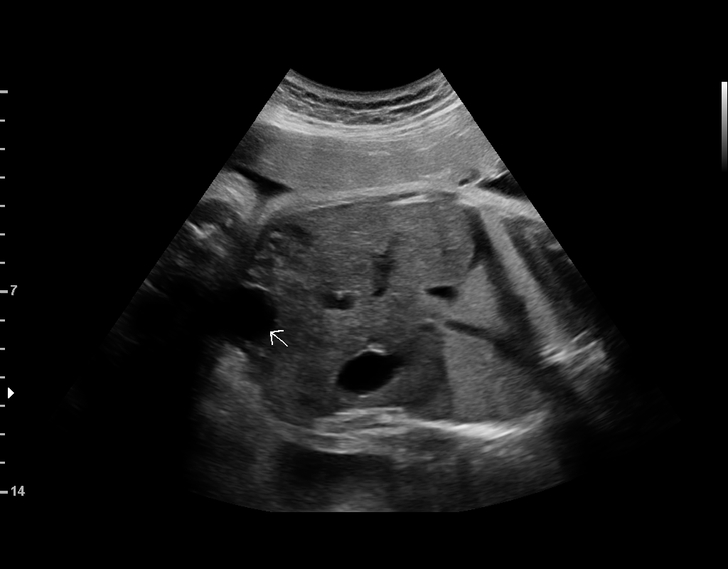
[im 21/36]
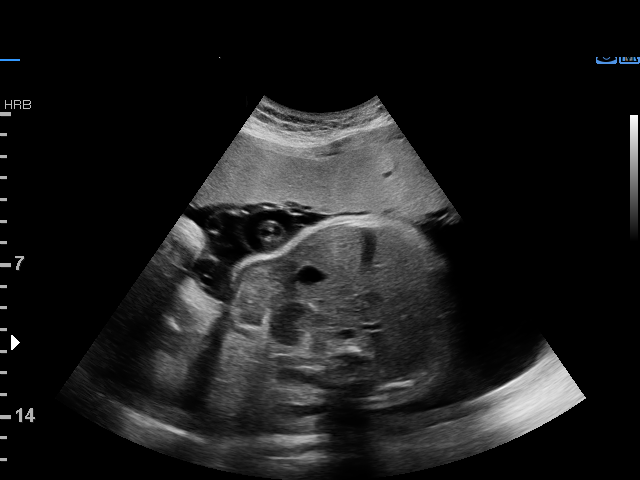
[im 24/36]
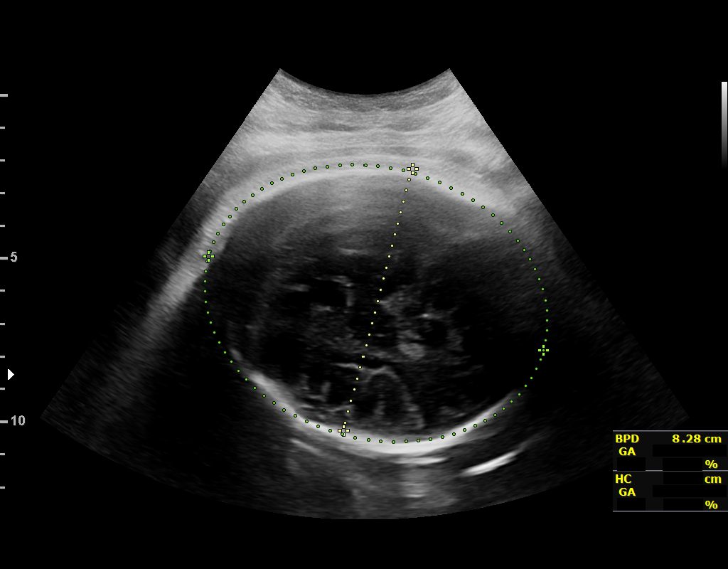
[im 26/36]
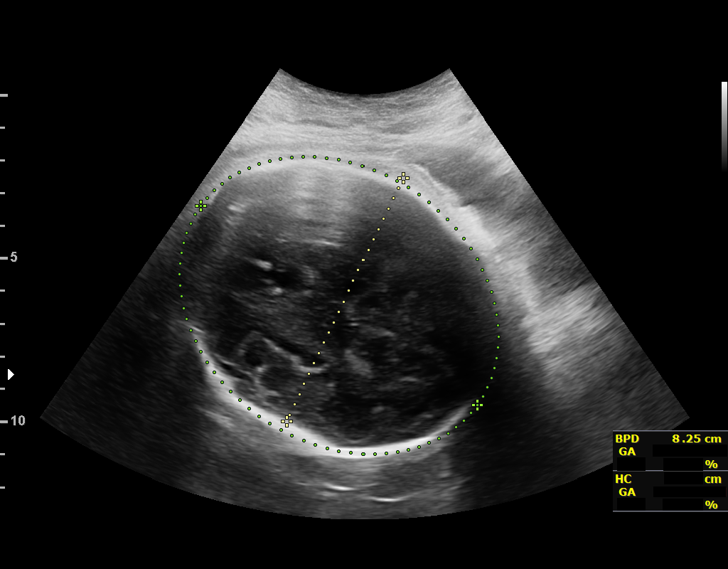
[im 29/36]
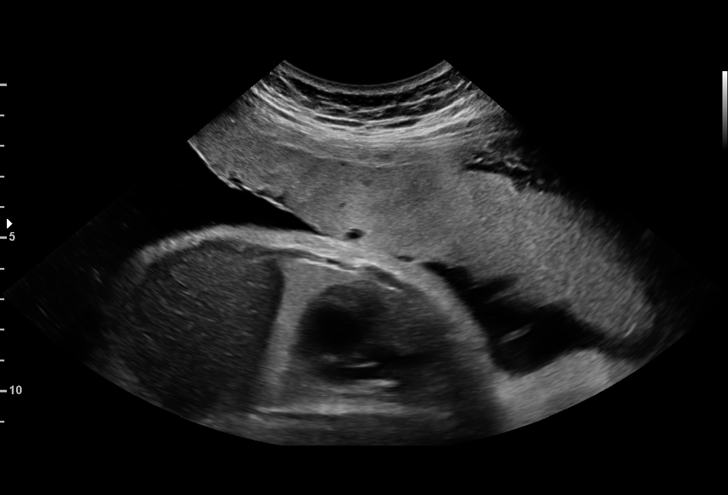
[im 32/36]
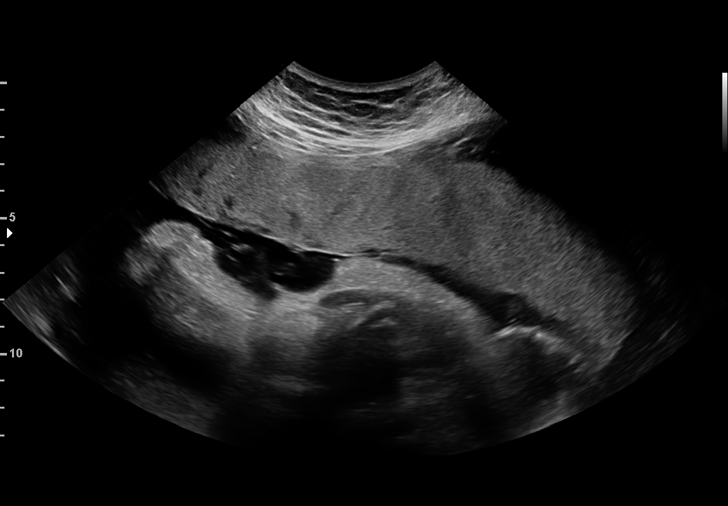
[im 34/36]
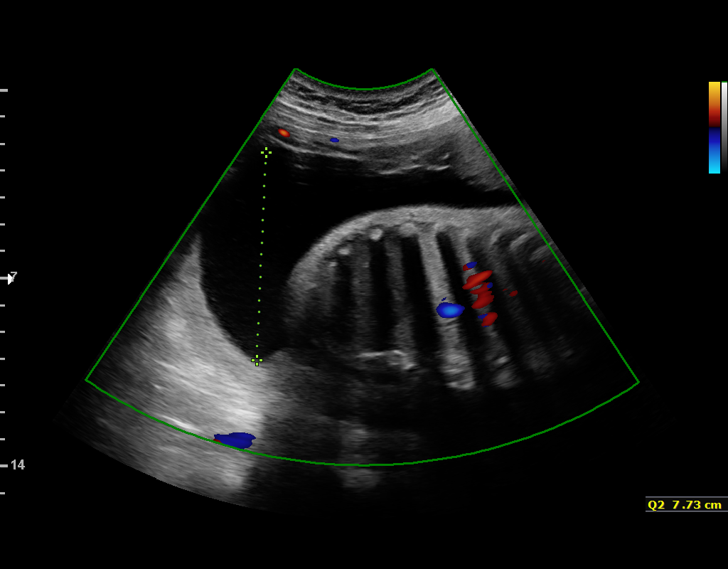

[13 of 28 positions shown; findings below may reference images not displayed]

PINCHBACK


Indications

Hypertension - Chronic/Pre-existing (ASA,
labetalol)
Obesity complicating pregnancy, third
trimester
Pre-existing diabetes, type 2, in pregnancy,
unspecified trimester (insulin)
31 weeks gestation of pregnancy
Vital Signs

BMI:
Fetal Evaluation

Num Of Fetuses:         1
Fetal Heart Rate(bpm):  148
Cardiac Activity:       Observed
Presentation:           Cephalic
Placenta:               Anterior
P. Cord Insertion:      Previously Visualized

Amniotic Fluid
AFI FV:      Within normal limits

AFI Sum(cm)     %Tile       Largest Pocket(cm)
21.99           85
RUQ(cm)       RLQ(cm)       LUQ(cm)        LLQ(cm)
6.53
Biophysical Evaluation

Amniotic F.V:   Within normal limits       F. Tone:        Observed
F. Movement:    Observed                   Score:          [DATE]
F. Breathing:   Observed
Biometry

BPD:      82.5  mm     G. Age:  33w 1d         79  %    CI:         76.7   %    70 - 86
FL/HC:      20.5   %    19.1 -
HC:      298.4  mm     G. Age:  33w 0d         45  %    HC/AC:      0.92        0.96 -
AC:      324.9  mm     G. Age:  36w 3d       > 97  %    FL/BPD:     74.3   %    71 - 87
FL:       61.3  mm     G. Age:  31w 6d         36  %    FL/AC:      18.9   %    20 - 24
HUM:        55  mm     G. Age:  32w 0d         53  %
Est. FW:    3953  gm      5 lb 6 oz     89  %
OB History

Gravidity:    3         Term:   2        Prem:   0        SAB:   0
TOP:          0       Ectopic:  0        Living: 2
Gestational Age

LMP:           33w 2d        Date:  08/08/17                 EDD:   05/15/18
U/S Today:     33w 4d                                        EDD:   05/13/18
Best:          31w 6d     Det. By:  U/S  (12/30/17)          EDD:   05/25/18
Anatomy

Cranium:               Appears normal         Aortic Arch:            Previously seen
Cavum:                 Appears normal         Ductal Arch:            Previously seen
Ventricles:            Previously seen        Diaphragm:              Appears normal
Choroid Plexus:        Previously seen        Stomach:                Appears normal, left
sided
Cerebellum:            Previously seen        Abdomen:                Appears normal
Posterior Fossa:       Previously seen        Abdominal Wall:         Previously seen
Nuchal Fold:           Previously seen        Cord Vessels:           Previously seen
Face:                  Orbits and profile     Kidneys:                Appear normal
previously seen
Lips:                  Previously seen        Bladder:                Appears normal
Thoracic:              Appears normal         Spine:                  Previously seen
Heart:                 Previously seen        Upper Extremities:      Previously seen
RVOT:                  Previously seen        Lower Extremities:      Previously seen
LVOT:                  Previously seen

Other:  Heels and 5th digit previously visualized. Fetus appears to be a
female.
Cervix Uterus Adnexa

Cervix
Not visualized (advanced GA >42wks)
Impression

The estimated fetal weight is at greater than the 90th
percentile. Abdominal circumference measures at greater
than the 95th percentile. Amniotic fluid is normal and good
fetal activity is seen.

Patient had fetal echo that was reported as normal except
mild left ventricular hypertrophy. She has a follow-up ECHO
appt (04/01/18).

Gestational diabetes: Patient reports she takes lantus insulin
38 units twice daily and novolog [DATE] units with meals.
She reports her diabetes is now better controlled.
Hypertension: Patient takes labetalol 600 mg tid. BP at our
office: 128/89 mm Hg.
Recommendations

-Weekly antenatal testing from next week till delivery.

## 2019-06-04 IMAGING — US US FETAL BPP W/ NON-STRESS
1 series · 13 of 14 positions shown · non-contrast
Comparison: none

[Series 1: us fetal bpp w/nonstress · 14 acquisitions, 13 frames shown]
[im 1/14]
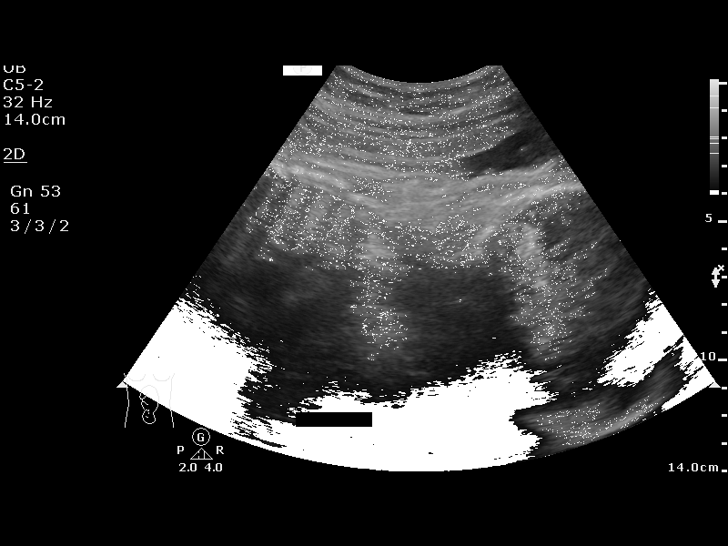
[im 2/14]
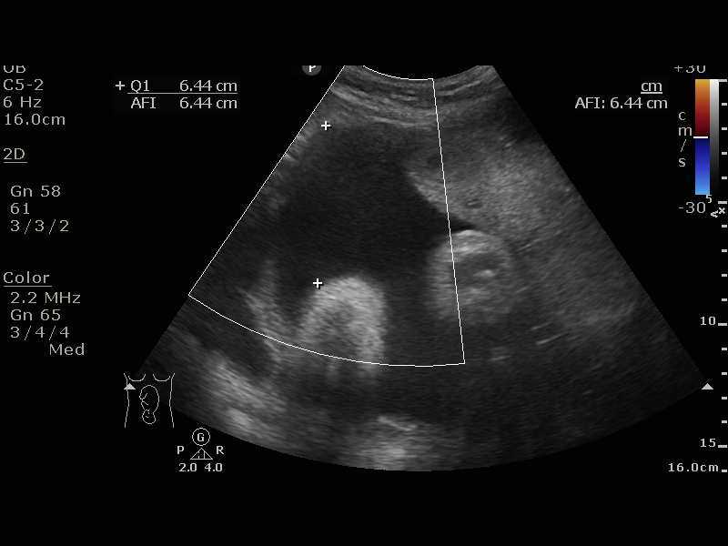
[im 3/14]
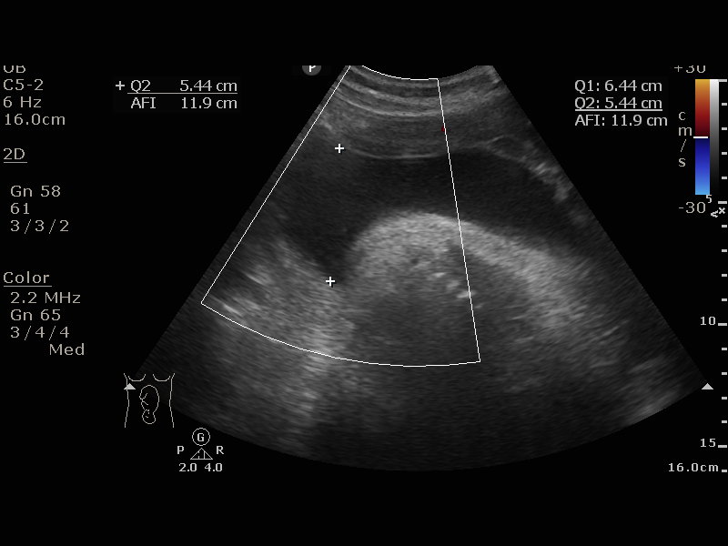
[im 4/14]
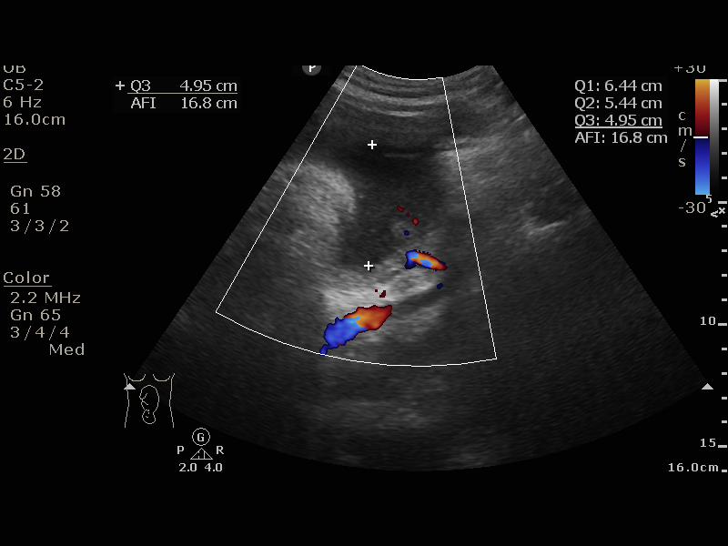
[im 5/14]
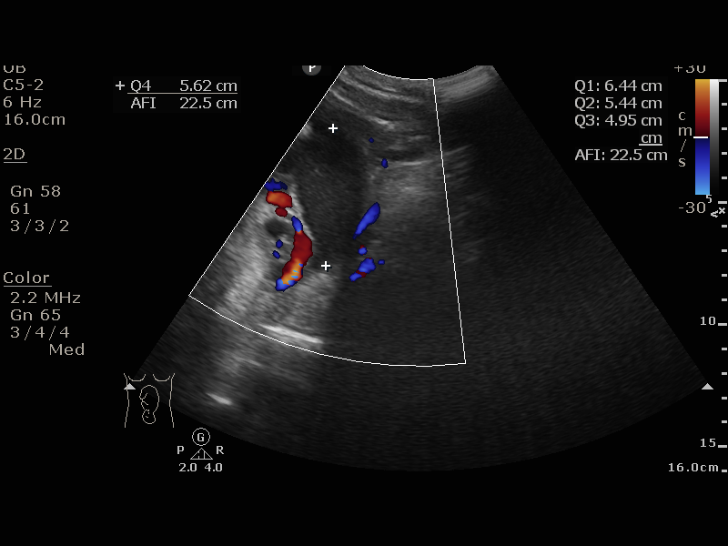
[im 6/14]
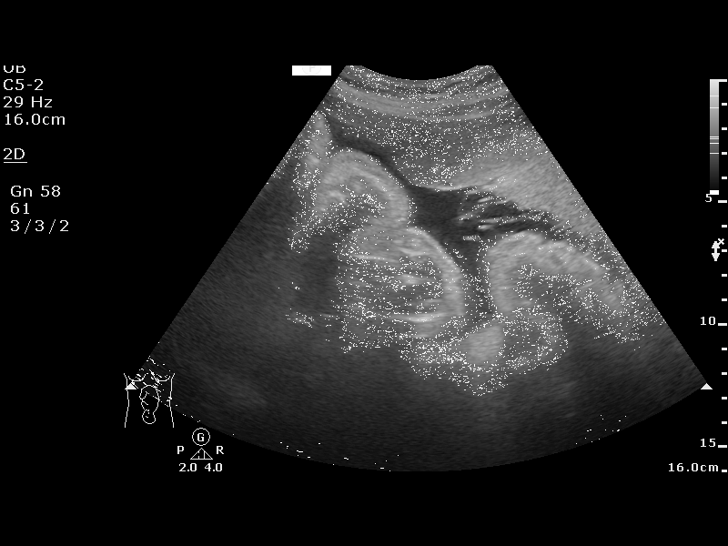
[im 8/14]
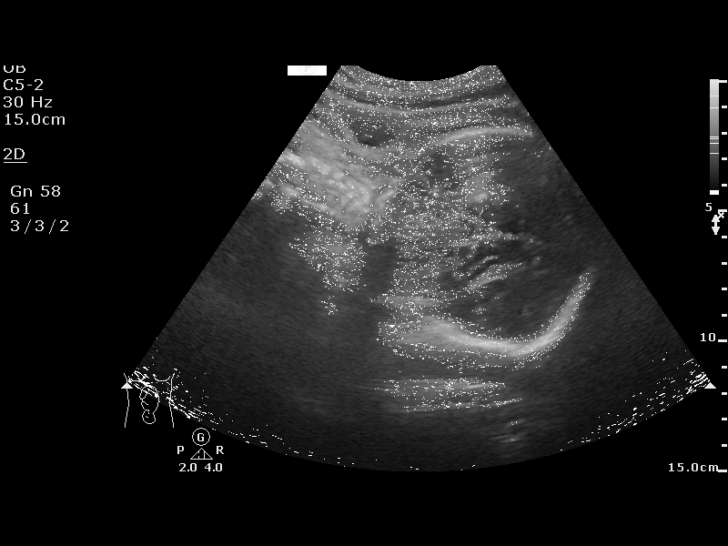
[im 9/14]
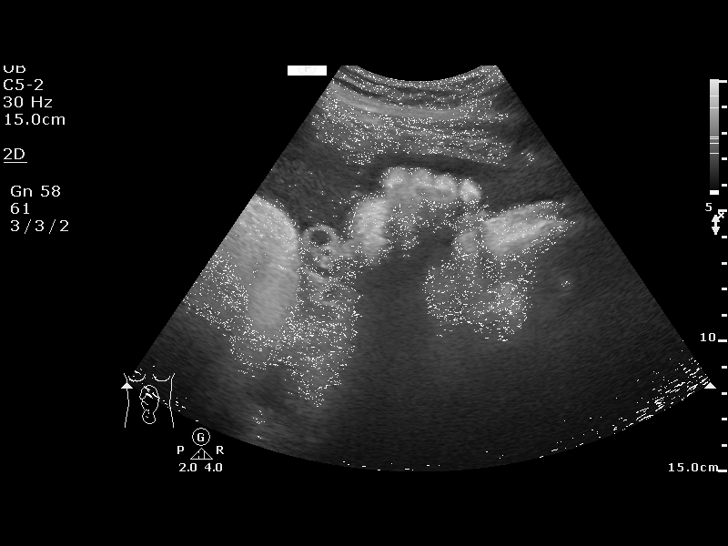
[im 10/14]
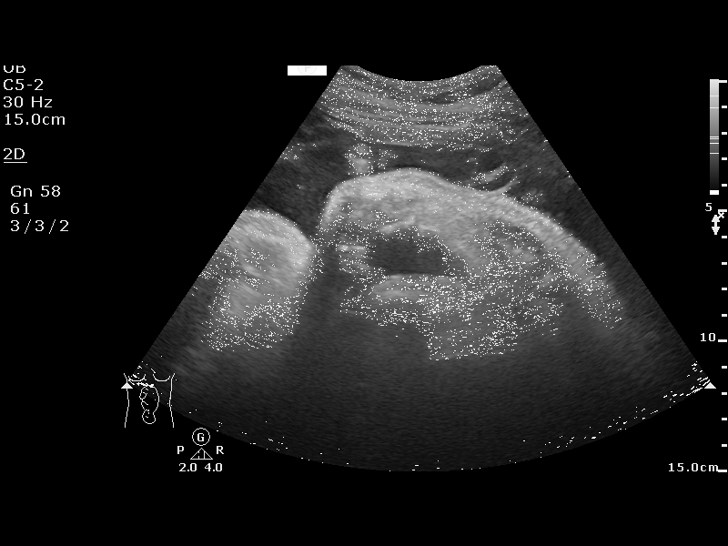
[im 11/14]
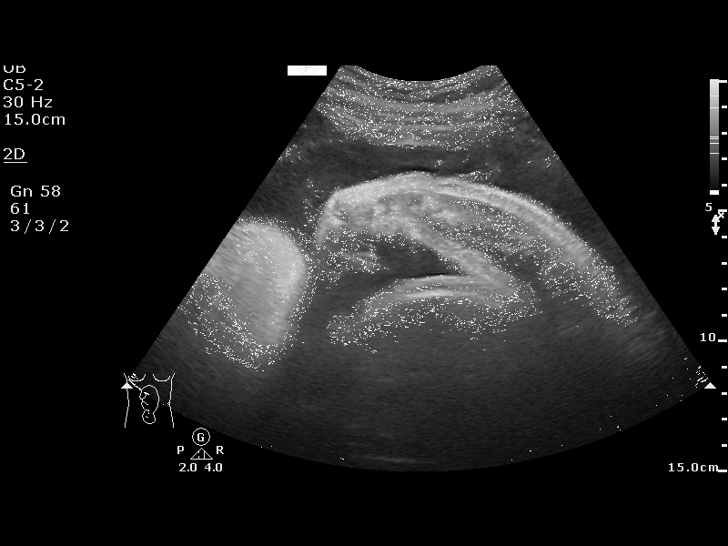
[im 12/14]
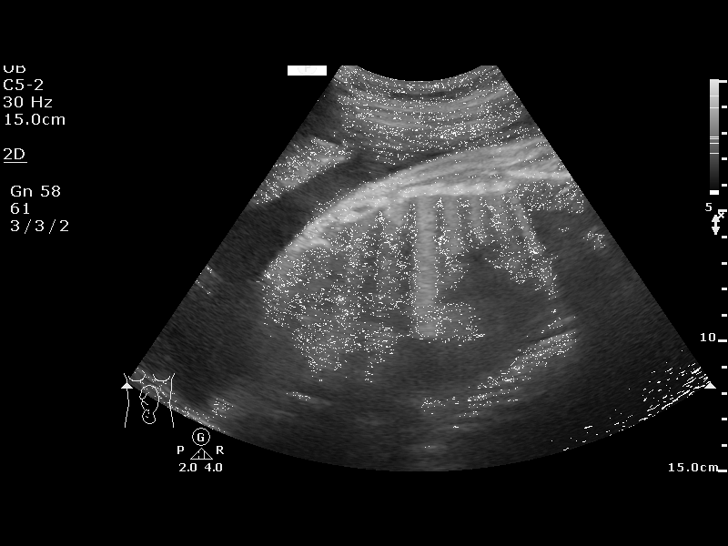
[im 13/14]
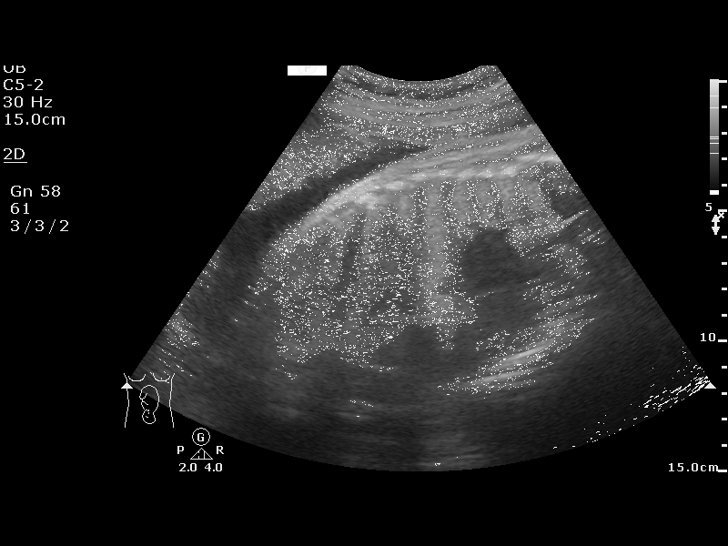
[im 14/14]
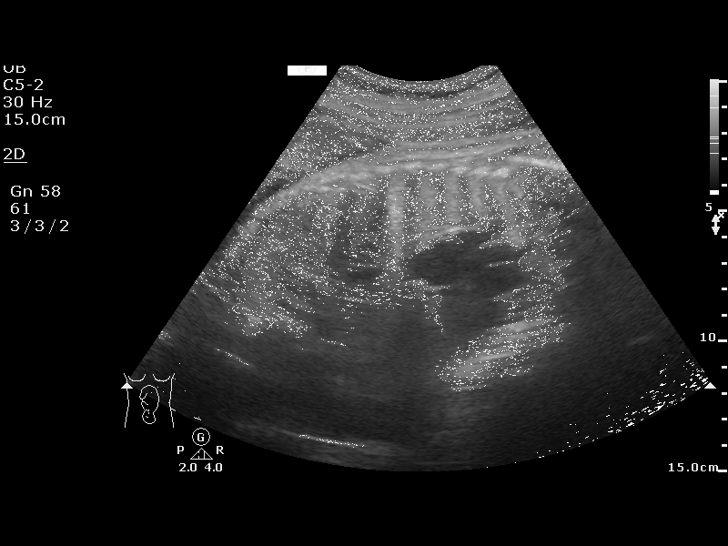

[13 of 14 positions shown; findings below may reference images not displayed]

PINCHBACK

                                                            [facility]yn Clinic
                                                            [facility]
                                                            [REDACTED]

 ----------------------------------------------------------------------

 ----------------------------------------------------------------------
Service(s) Provided

 ----------------------------------------------------------------------
Indications

  33 weeks gestation of pregnancy
  Unspecified pre-existing hypertension
  complicating pregnancy, third trimester
  Pre-existing diabetes, type 2, in pregnancy,
  third trimester
 ----------------------------------------------------------------------
Vital Signs

                                                Height:        5'6"
Fetal Evaluation

 Num Of Fetuses:         1
 Preg. Location:         Intrauterine
 Cardiac Activity:       Observed
 Presentation:           Cephalic

 Amniotic Fluid
 AFI FV:      Subjectively upper-normal

 AFI Sum(cm)     %Tile       Largest Pocket(cm)
 22.45           85
 RUQ(cm)       RLQ(cm)       LUQ(cm)        LLQ(cm)

Biophysical Evaluation

 Amniotic F.V:   Pocket => 2 cm two         F. Tone:        Observed
                 planes
 F. Movement:    Observed                   N.S.T:          Reactive
 F. Breathing:   Observed                   Score:          [DATE]
OB History

 Gravidity:    3         Term:   2        Prem:   0        SAB:   0
 TOP:          0       Ectopic:  0        Living: 2
Gestational Age

 LMP:           35w 1d        Date:  08/08/17                 EDD:   05/15/18
 Best:          33w 5d     Det. By:  U/S  (12/30/17)          EDD:   05/25/18
Impression

Recommendations

 Continue antenatal testing
                 Siwayi Peringanda, DO

## 2019-06-30 ENCOUNTER — Other Ambulatory Visit: Payer: Self-pay

## 2019-06-30 ENCOUNTER — Encounter (HOSPITAL_COMMUNITY): Payer: Self-pay | Admitting: Family Medicine

## 2019-06-30 ENCOUNTER — Ambulatory Visit (HOSPITAL_COMMUNITY)
Admission: EM | Admit: 2019-06-30 | Discharge: 2019-06-30 | Disposition: A | Discharge status: Home / Self Care | Payer: Medicaid Other | Attending: Family Medicine | Admitting: Family Medicine

## 2019-06-30 DIAGNOSIS — R739 Hyperglycemia, unspecified: Secondary | ICD-10-CM

## 2019-06-30 LAB — CBG MONITORING, ED: Glucose-Capillary: 308 mg/dL — ABNORMAL HIGH (ref 70–99)

## 2019-06-30 LAB — GLUCOSE, CAPILLARY: Glucose-Capillary: 308 mg/dL — ABNORMAL HIGH (ref 70–99)

## 2019-06-30 MED ORDER — ONDANSETRON 4 MG PO TBDP: 4.0000 mg | ORAL_TABLET | Freq: Three times a day (TID) | ORAL | 0 refills | Status: DC | PRN

## 2019-06-30 NOTE — ED Triage Notes (Signed)
Pt here requesting CBG check; pt sts checked yesterday was 303; pt sts can not find her meter; pt sts takes metformin and lantus

## 2019-06-30 NOTE — Discharge Instructions (Signed)
Keep checking your blood sugars and drink plenty of water. Watch your diet and rest Follow up with your doctor on Tuesday as planned  If worse through the weekend go to the ER.

## 2019-07-02 NOTE — ED Provider Notes (Signed)
[place]    CSN: [id number] Arrival date & time: 06/30/19  1140      History   Chief Complaint Chief Complaint  Patient presents with  . Hyperglycemia    HPI Cheryl Curry is a 34 y.o. female.   Patient is a 34 year old female with past medical history of diabetes, hypertension.  She presents today for blood sugar check.  Reporting her blood sugars at home of been running in the 300s.  She was concerned that her meter was not working properly.  She has been taking the Metformin twice a day and Lantus at night.  She has had some mild nausea and loss of appetite.  Denies any abdominal pain, vomiting, diarrhea, increased thirst or increased urination.  Was on NovoLog when pregnant.  Denies any dysuria, hematuria or urinary frequency.  Denies any fever, chills, body aches, cough, nasal congestion, rhinorrhea, sore throat or ear pain.  Denies any chest pain or shortness of breath.  ROS per HPI      Past Medical History:  Diagnosis Date  . Diabetes mellitus without complication ([place])   . Hypertension   . Pregnancy induced hypertension     Patient Active Problem List   Diagnosis Date Noted  . Routine postpartum follow-up 05/27/2018  . Encounter for insertion subdermal contraceptive 05/27/2018  . Chronic hypertension affecting pregnancy 04/27/2018  . Chronic hypertension with superimposed preeclampsia 04/27/2018  . LGA (large for gestational age) fetus affecting management of mother 04/25/2018  . Fetal cardiac anomaly affecting pregnancy, antepartum 03/22/2018  . Obesity in pregnancy 11/08/2017  . GBS bacteriuria 11/04/2017  . Supervision of high risk pregnancy, antepartum 10/28/2017  . Medication exposure during first trimester of pregnancy 10/28/2017  . Chronic hypertension during pregnancy, antepartum 10/28/2017  . DM2 in pregnancy 10/21/2017    Past Surgical History:  Procedure Laterality Date  . NO PAST SURGERIES      OB History     Gravida  3   Para  3   Term  2   Preterm  1   AB  0   Living  3     SAB      TAB      Ectopic      Multiple  0   Live Births  3            Home Medications    Prior to Admission medications   Medication Sig Start Date End Date Taking? Authorizing Provider  acetaminophen (TYLENOL) 500 MG tablet Take 1,000 mg by mouth every 6 (six) hours as needed for mild pain.    [provider]  amLODipine (NORVASC) 5 MG tablet Take 1 tablet (5 mg total) by mouth daily. 03/01/19   Robyn Haber, MD  lisinopril (ZESTRIL) 20 MG tablet Take 1 tablet (20 mg total) by mouth daily. 03/01/19   Robyn Haber, MD  metFORMIN (GLUCOPHAGE) 500 MG tablet Take 1 tablet (500 mg total) by mouth 2 (two) times daily with a meal. 03/01/19   Robyn Haber, MD  ondansetron (ZOFRAN ODT) 4 MG disintegrating tablet Take 1 tablet (4 mg total) by mouth every 8 (eight) hours as needed for nausea or vomiting. 06/30/19   Loura Halt A, NP  insulin aspart (NOVOLOG) 100 UNIT/ML injection 30 units with breakfast; 20 units with lunch and 30 units with dinner Patient not taking: Reported on 05/27/2018 04/18/18 03/01/19  Donnamae Jude, MD  insulin NPH Human (HUMULIN N,NOVOLIN N) 100 UNIT/ML injection 46 units in the morning and  46 units at bedtime Patient not taking: Reported on 05/27/2018 04/18/18 03/01/19  Reva Bores, MD  labetalol (NORMODYNE) 200 MG tablet Take 3 tablets (600 mg total) by mouth 3 (three) times daily. Patient not taking: Reported on 05/27/2018 04/25/18 03/01/19  Donette Larry, CNM  medroxyPROGESTERone (PROVERA) 10 MG tablet Take 1 tablet (10 mg total) by mouth daily. 08/24/18 03/01/19  Adam Phenix, MD  NIFEdipine (PROCARDIA-XL/NIFEDICAL-XL) 30 MG 24 hr tablet Take 1 tablet (30 mg total) by mouth 2 (two) times daily. 05/27/18 03/01/19  Gerrit Heck, CNM    Family History Family History  Problem Relation Age of Onset  . Hypertension Mother   . Hypertension Father     Social  History Social History   Tobacco Use  . Smoking status: Never Smoker  . Smokeless tobacco: Never Used  Substance Use Topics  . Alcohol use: Not Currently    Comment: occassional  . Drug use: No     Allergies   Patient has no known allergies.   Review of Systems Review of Systems   Physical Exam Triage Vital Signs ED Triage Vitals  Enc Vitals Group     BP 06/30/19 1204 (!) 161/99     Pulse Rate 06/30/19 1204 75     Resp 06/30/19 1204 18     Temp 06/30/19 1204 98.4 F (36.9 C)     Temp Source 06/30/19 1204 Oral     SpO2 06/30/19 1204 100 %     Weight --      Height --      Head Circumference --      Peak Flow --      Pain Score 06/30/19 1205 0     Pain Loc --      Pain Edu? --      Excl. in GC? --    No data found.  Updated Vital Signs BP (!) 161/99 (BP Location: Right Arm)   Pulse 75   Temp 98.4 F (36.9 C) (Oral)   Resp 18   SpO2 100%   Visual Acuity Right Eye Distance:   Left Eye Distance:   Bilateral Distance:    Right Eye Near:   Left Eye Near:    Bilateral Near:     Physical Exam Vitals and nursing note reviewed.  Constitutional:      General: She is not in acute distress.    Appearance: Normal appearance. She is not ill-appearing, toxic-appearing or diaphoretic.  HENT:     Head: Normocephalic.     Nose: Nose normal.     Mouth/Throat:     Pharynx: Oropharynx is clear.  Eyes:     Conjunctiva/sclera: Conjunctivae normal.  Pulmonary:     Effort: Pulmonary effort is normal.  Abdominal:     Palpations: Abdomen is soft.     Tenderness: There is no abdominal tenderness.  Musculoskeletal:        General: Normal range of motion.     Cervical back: Normal range of motion.  Skin:    General: Skin is warm and dry.     Findings: No rash.  Neurological:     Mental Status: She is alert.  Psychiatric:        Mood and Affect: Mood normal.      UC Treatments / Results  Labs (all labs ordered are listed, but only abnormal results are  displayed) Labs Reviewed  GLUCOSE, CAPILLARY - Abnormal; Notable for the following components:      Result Value  Glucose-Capillary 308 (*)    All other components within normal limits  CBG MONITORING, ED - Abnormal; Notable for the following components:   Glucose-Capillary 308 (*)    All other components within normal limits    EKG   Radiology No results found.  Procedures Procedures (including critical care time)  Medications Ordered in UC Medications - No data to display  Initial Impression / Assessment and Plan / UC Course  I have reviewed the triage vital signs and the nursing notes.  Pertinent labs & imaging results that were available during my care of the patient were reviewed by me and considered in my medical decision making (see chart for details).     Hyperglycemia-blood sugar 300 here today. Consistent with blood sugar she has been taking at home.  Most likely her meter is working fine. Believe patient may need short acting insulin to keep her blood sugars under control. Recommend follow-up with her primary care doctor about this. No concerning signs or symptoms on exam today.  No concern for DKA. Recommended push fluids to include water to lower blood sugars and watch her diet and avoid sugary drinks and foods and carbohydrates. Patient understanding and agreed to plan. Final Clinical Impressions(s) / UC Diagnoses   Final diagnoses:  Hyperglycemia     Discharge Instructions     Keep checking your blood sugars and drink plenty of water. Watch your diet and rest Follow up with your doctor on Tuesday as planned  If worse through the weekend go to the ER.     ED Prescriptions    Medication Sig Dispense Auth. Provider   ondansetron (ZOFRAN ODT) 4 MG disintegrating tablet Take 1 tablet (4 mg total) by mouth every 8 (eight) hours as needed for nausea or vomiting. 20 tablet Dahlia Byes A, NP     PDMP not reviewed this encounter.   Janace Aris,  NP 07/02/19 1616

## 2019-08-09 DIAGNOSIS — I10 Essential (primary) hypertension: Secondary | ICD-10-CM | POA: Insufficient documentation

## 2019-08-09 DIAGNOSIS — E119 Type 2 diabetes mellitus without complications: Secondary | ICD-10-CM | POA: Insufficient documentation

## 2020-07-31 ENCOUNTER — Other Ambulatory Visit: Payer: Self-pay | Admitting: Podiatrist

## 2020-07-31 ENCOUNTER — Ambulatory Visit (INDEPENDENT_AMBULATORY_CARE_PROVIDER_SITE_OTHER): Payer: Medicaid Other

## 2020-07-31 ENCOUNTER — Other Ambulatory Visit: Payer: Self-pay

## 2020-07-31 ENCOUNTER — Encounter: Payer: Self-pay | Admitting: Podiatrist

## 2020-07-31 ENCOUNTER — Ambulatory Visit: Payer: Medicaid Other | Admitting: Podiatrist

## 2020-07-31 DIAGNOSIS — M79671 Pain in right foot: Secondary | ICD-10-CM

## 2020-07-31 DIAGNOSIS — M7661 Achilles tendinitis, right leg: Secondary | ICD-10-CM | POA: Diagnosis not present

## 2020-07-31 DIAGNOSIS — M76821 Posterior tibial tendinitis, right leg: Secondary | ICD-10-CM

## 2020-07-31 DIAGNOSIS — Q742 Other congenital malformations of lower limb(s), including pelvic girdle: Secondary | ICD-10-CM | POA: Diagnosis not present

## 2020-07-31 NOTE — Progress Notes (Signed)
Chief Complaint  Patient presents with  . Pain    Rt medial side pain x a while/ few months; pain varies (5/10) -no injury/swelling/nubmness Tx: icy hot -wrose with standing   . Diabetes    FBS: don't check A1C: 12     HPI: Patient is 35 y.o. female who presents today for the concerns as listed above.   Patient Active Problem List   Diagnosis Date Noted  . Essential hypertension 08/09/2019  . Type 2 diabetes mellitus ([facility]) 08/09/2019  . Routine postpartum follow-up 05/27/2018  . Encounter for insertion subdermal contraceptive 05/27/2018  . Chronic hypertension affecting pregnancy 04/27/2018  . Chronic hypertension with superimposed preeclampsia 04/27/2018  . LGA (large for gestational age) fetus affecting management of mother 04/25/2018  . Fetal cardiac anomaly affecting pregnancy, antepartum 03/22/2018  . Obesity in pregnancy 11/08/2017  . GBS bacteriuria 11/04/2017  . Supervision of high risk pregnancy, antepartum 10/28/2017  . Medication exposure during first trimester of pregnancy 10/28/2017  . Chronic hypertension during pregnancy, antepartum 10/28/2017  . DM2 in pregnancy 10/21/2017    Current Outpatient Medications on File Prior to Visit  Medication Sig Dispense Refill  . Dulaglutide (TRULICITY) 0.75 MG/0.5ML SOPN Inject into the skin.    Marland Kitchen insulin glargine (LANTUS) 100 UNIT/ML injection Inject into the skin daily.    Marland Kitchen acetaminophen (TYLENOL) 500 MG tablet Take 1,000 mg by mouth every 6 (six) hours as needed for mild pain.    Marland Kitchen amLODipine (NORVASC) 5 MG tablet Take 1 tablet (5 mg total) by mouth daily. 90 tablet 3  . lisinopril (ZESTRIL) 20 MG tablet Take 1 tablet (20 mg total) by mouth daily. 90 tablet 3  . metFORMIN (GLUCOPHAGE) 500 MG tablet Take 1 tablet (500 mg total) by mouth 2 (two) times daily with a meal. 180 tablet 3  . ondansetron (ZOFRAN ODT) 4 MG disintegrating tablet Take 1 tablet (4 mg total) by mouth every 8 (eight) hours as needed for nausea or vomiting.  20 tablet 0  . [DISCONTINUED] insulin aspart (NOVOLOG) 100 UNIT/ML injection 30 units with breakfast; 20 units with lunch and 30 units with dinner (Patient not taking: Reported on 05/27/2018) 10 mL 12  . [DISCONTINUED] insulin NPH Human (HUMULIN N,NOVOLIN N) 100 UNIT/ML injection 46 units in the morning and 46 units at bedtime (Patient not taking: Reported on 05/27/2018) 10 mL 5  . [DISCONTINUED] labetalol (NORMODYNE) 200 MG tablet Take 3 tablets (600 mg total) by mouth 3 (three) times daily. (Patient not taking: Reported on 05/27/2018) 90 tablet 0  . [DISCONTINUED] medroxyPROGESTERone (PROVERA) 10 MG tablet Take 1 tablet (10 mg total) by mouth daily. 30 tablet 0  . [DISCONTINUED] NIFEdipine (PROCARDIA-XL/NIFEDICAL-XL) 30 MG 24 hr tablet Take 1 tablet (30 mg total) by mouth 2 (two) times daily. 60 tablet 0   No current facility-administered medications on file prior to visit.    No Known Allergies  Review of Systems No fevers, chills, nausea, muscle aches, no difficulty breathing, no calf pain, no chest pain or shortness of breath.   Physical Exam  GENERAL APPEARANCE: Alert, conversant. Appropriately groomed. No acute distress.   VASCULAR: Pedal pulses palpable DP and PT bilateral.  Capillary refill time is immediate to all digits,  Proximal to distal cooling it warm to warm.  Digital perfusion adequate.   NEUROLOGIC: sensation is intact to 5.07 monofilament at 5/5 sites bilateral.  Light touch is intact bilateral, vibratory sensation intact bilateral  MUSCULOSKELETAL: acceptable muscle strength, tone and stability bilateral.  Prominent navicular  tuberosity is noted on the medial aspect of the right foot.  Pain along the insertion of the posterior tibial tendon on the navicular is also palpated.  DERMATOLOGIC: skin is warm, supple, and dry.  No open lesions noted.  No rash, no pre ulcerative lesions. Digital nails are asymptomatic.    Radiologic evaluation plan three views of the right  foot are obtained.  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  No sign of fracture or dislocation is noted.  Assessment     ICD-10-CM   1. Right foot pain  M79.671 CANCELED: DG Foot Complete Right  2. Posterior tibial tendinitis of right lower extremity  M76.821   3. 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 -Recommended power step inserts to help support the posterior tibial tendon and these were dispensed for her use. -In for musculoskeletal pain cream from [facility] was written for her to use I recommended she try Voltaren gel first and if this is not beneficial to try the [facility] medication. -Discussed she may need a custom orthotic device if the power steps are not beneficial.  Also discussed she may need injection therapy or ultimately surgical therapy in the future. -Follow-up with Dr. Lilian Kapur for continued care and treatment.

## 2020-07-31 NOTE — Patient Instructions (Addendum)
Try VOLTAREN (DICLOFENAC) GEL- OVER THE COUNTER.  Massage into foot up to 4 times a day. If this fails to relieve the pain,get the prescription filled from [facility].. info here-   I have ordered a medication for you that will come from [facility] in [place]. They should be calling you to verify insurance and will mail the medication to you. If you live close by then you can go by their pharmacy to pick up the medication. Their phone number is [phone]. If you do not hear from them in the next few days, please give [place] a call at [phone].      Posterior Tibial Tendinitis Posterior tibial tendinitis is irritation of a tendon called the posterior tibial tendon. Your posterior tibial tendon is a cord-like tissue that connects bones of your lower leg and foot to a muscle that:  Supports your arch.  Helps you raise up on your toes.  Helps you turn your foot down and in. This condition causes foot and ankle pain. It can also lead to a flat foot. What are the causes? This condition is most often caused by repeated stress to the tendon (overuse injury). It can also be caused by a sudden injury that stresses the tendon, such as landing on your foot after jumping or falling. What increases the risk? This condition is more likely to develop in:  People who play a sport that involves putting a lot of pressure on the feet, such as: ? Basketball. ? Tennis. ? Soccer. ? Hockey.  Runners.  Females who are older than 36 years of age and are overweight.  People with diabetes.  People with decreased foot stability.  People with flat feet. What are the signs or symptoms? Symptoms include:  Pain in the inner ankle.  Pain at the arch of your foot.  Pain that gets worse with running, walking, or standing.  Swelling on the inside of your ankle and foot.  Weakness in your ankle or foot.  Inability to stand up on tiptoe.  Flattening of the arch of your foot. How  is this diagnosed? This condition may be diagnosed based on:  Your symptoms.  Your medical history.  A physical exam.  Tests, such as: ? X-ray. ? MRI. ? Ultrasound. How is this treated? This condition may be treated by:  Putting ice to the injured area.  Taking NSAIDs, such as ibuprofen, to reduce pain and swelling.  Wearing a special shoe or shoe insert to support your arch (orthotic).  Having physical therapy.  Replacing high-impact exercise with low-impact exercise, such as swimming or cycling. If your symptoms do not improve with these treatments, you may need to wear a splint, removable walking boot, or short leg cast for 6-8 weeks to keep your foot and ankle still (immobilized). Follow these instructions at home: If you have a cast, splint, or boot:  Keep it clean and dry.  Check the skin around it every day. Tell your health care provider about any concerns. If you have a cast:  Do not stick anything inside it to scratch your skin. Doing that increases your risk of infection.  You may put lotion on dry skin around the edges of the cast. Do not put lotion on the skin underneath the cast. If you have a splint or boot:  Wear it as told by your health care provider. Remove it only as told by your health care provider.  Loosen it if your toes tingle, become numb, or turn  cold and blue. Bathing  Do not take baths, swim, or use a hot tub until your health care provider approves. Ask your health care provider if you may take showers.  If your cast, splint, or boot is not waterproof: ? Do not let it get wet. ? Cover it with a waterproof covering while you take a bath or a shower. Managing pain and swelling  If directed, put ice on the injured area. ? If you have a removable splint or boot, remove it as told by your health care provider. ? Put ice in a plastic bag. ? Place a towel between your skin and the bag or between your cast and the bag. ? Leave the ice on  for 20 minutes, 2-3 times a day.  Move your toes often to reduce stiffness and swelling.  Raise (elevate) the injured area above the level of your heart while you are sitting or lying down.   Activity  Do not use the injured foot to support your body weight until your health care provider says that you can. Use crutches as told by your health care provider.  Do not do activities that make pain or swelling worse.  Ask your health care provider when it is safe to drive if you have a cast, splint, or boot on your foot.  Return to your normal activities as told by your health care provider. Ask your health care provider what activities are safe for you.  Do exercises as told by your health care provider. General instructions  Take over-the-counter and prescription medicines only as told by your health care provider.  If you have an orthotic, use it as told by your health care provider.  Keep all follow-up visits as told by your health care provider. This is important. How is this prevented?  Wear footwear that is appropriate to your athletic activity.  Avoid athletic activities that cause pain or swelling in your ankle or foot.  Before being active, do range-of-motion and stretching exercises.  If you develop pain or swelling while training, stop training.  If you have pain or swelling that does not improve after a few days of rest, see your health care provider.  If you start a new athletic activity, start gradually so you can build up your strength and flexibility. Contact a health care provider if:  Your symptoms get worse.  Your symptoms do not improve in 6-8 weeks.  You develop new, unexplained symptoms.  Your splint, boot, or cast gets damaged. Summary  Posterior tibial tendinitis is irritation of a tendon called the posterior tibial tendon.  This condition is most often caused by repeated stress to the tendon (overuse injury).  This condition causes foot pain and  ankle pain. It can also lead to a flat foot.  This condition may be treated by not doing high-impact activities, applying ice, having physical therapy, wearing orthotics, and wearing a cast, splint, or boot if needed. This information is not intended to replace advice given to you by your health care provider. Make sure you discuss any questions you have with your health care provider. Document Revised: 09/20/2018 Document Reviewed: 07/28/2018 Elsevier Patient Education  2021 [employer].

## 2020-08-07 ENCOUNTER — Other Ambulatory Visit: Payer: Self-pay

## 2020-08-07 ENCOUNTER — Ambulatory Visit (HOSPITAL_COMMUNITY)
Admission: EM | Admit: 2020-08-07 | Discharge: 2020-08-07 | Disposition: A | Discharge status: Home / Self Care | Payer: Medicaid Other | Attending: Family Medicine | Admitting: Family Medicine

## 2020-08-07 ENCOUNTER — Encounter (HOSPITAL_COMMUNITY): Payer: Self-pay | Admitting: Emergency Medicine

## 2020-08-07 DIAGNOSIS — N898 Other specified noninflammatory disorders of vagina: Secondary | ICD-10-CM | POA: Diagnosis not present

## 2020-08-07 MED ORDER — FLUCONAZOLE 150 MG PO TABS: ORAL_TABLET | ORAL | 0 refills | Status: DC

## 2020-08-07 MED ORDER — METRONIDAZOLE 500 MG PO TABS: 500.0000 mg | ORAL_TABLET | Freq: Two times a day (BID) | ORAL | 0 refills | Status: DC

## 2020-08-07 NOTE — ED Provider Notes (Addendum)
[facility]   [id number] 08/07/20 Arrival Time: 0910  ASSESSMENT & PLAN:  1. Vaginal irritation   2. Vaginal discharge     Begin: Meds ordered this encounter  Medications  . metroNIDAZOLE (FLAGYL) 500 MG tablet    Sig: Take 1 tablet (500 mg total) by mouth 2 (two) times daily.    Dispense:  14 tablet    Refill:  0  . fluconazole (DIFLUCAN) 150 MG tablet    Sig: Take one tablet by mouth as a single dose. May repeat in 3 days if symptoms persist.    Dispense:  2 tablet    Refill:  0      Discharge Instructions     We have sent testing for vaginal infections. We will notify you of any positive results once they are received. If required, we will prescribe any medications you might need.  Please refrain from all sexual activity for at least the next seven days.     Without s/s of PID.  Labs Reviewed  CERVICOVAGINAL ANCILLARY ONLY   Will notify of any positive results. Instructed to refrain from sexual activity for at least seven days.  Reviewed expectations re: course of current medical issues. Questions answered. Outlined signs and symptoms indicating need for more acute intervention. Patient verbalized understanding. After Visit Summary given.   SUBJECTIVE:  Cheryl Curry is a 35 y.o. female who presents with complaint of vaginal irritation/dicharge. Onset gradual. First noticed approx 3 d ago. Describes discharge as thin and clear; without odor. With itching. No specific aggravating or alleviating factors reported. Denies: urinary frequency, dysuria and gross hematuria. Afebrile. No abdominal or pelvic pain. Normal PO intake wihout n/v. No genital rashes or lesions. No concerns re: STI. H/O yeast and BV in the past with similar symptoms. OTC treatment: monistat cream without much relief.  No LMP recorded. Patient has had an implant.   OBJECTIVE:  Vitals:   08/07/20 0953 08/07/20 0957  BP: (!) 153/110   Pulse:  93  Resp: 18   Temp:  98.3 F (36.8 C)   TempSrc: Oral   SpO2: 97%      General appearance: alert, cooperative, appears stated age and no distress Lungs: unlabored respirations; speaks full sentences without difficulty Back: no CVA tenderness; FROM at waist Abdomen: soft, non-tender GU: deferred Skin: warm and dry Psychological: alert and cooperative; normal mood and affect.   Labs Reviewed  CERVICOVAGINAL ANCILLARY ONLY    No Known Allergies  Past Medical History:  Diagnosis Date  . Diabetes mellitus without complication (HCC)   . Hypertension   . Pregnancy induced hypertension    Family History  Problem Relation Age of Onset  . Hypertension Mother   . Hypertension Father    Social History   Socioeconomic History  . Marital status: Married    Spouse name: Not on file  . Number of children: Not on file  . Years of education: Not on file  . Highest education level: Not on file  Occupational History  . Not on file  Tobacco Use  . Smoking status: Never Smoker  . Smokeless tobacco: Never Used  Vaping Use  . Vaping Use: Never used  Substance and Sexual Activity  . Alcohol use: Not Currently    Comment: occassional  . Drug use: No  . Sexual activity: Yes    Birth control/protection: None  Other Topics Concern  . Not on file  Social History Narrative   ** Merged History Encounter **  Social Determinants of Health   Financial Resource Strain: Not on file  Food Insecurity: Not on file  Transportation Needs: Not on file  Physical Activity: Not on file  Stress: Not on file  Social Connections: Not on file  Intimate Partner Violence: Not on file          Mardella Layman, MD 08/07/20 1042    Mardella Layman, MD 08/07/20 1043

## 2020-08-07 NOTE — ED Triage Notes (Signed)
Pt presents today with c/o of vaginal itching x 3 days. Denies vaginal discharge. Denies dysuria.

## 2020-08-07 NOTE — Discharge Instructions (Addendum)
We have sent testing for vaginal infections. We will notify you of any positive results once they are received. If required, we will prescribe any medications you might need.  Please refrain from all sexual activity for at least the next seven days.  

## 2020-08-08 ENCOUNTER — Ambulatory Visit (HOSPITAL_COMMUNITY): Payer: Self-pay

## 2020-08-08 LAB — CERVICOVAGINAL ANCILLARY ONLY
Bacterial Vaginitis (gardnerella): POSITIVE — AB
Candida Glabrata: NEGATIVE
Candida Vaginitis: POSITIVE — AB
Chlamydia: NEGATIVE
Comment: NEGATIVE
Comment: NEGATIVE
Comment: NEGATIVE
Comment: NEGATIVE
Comment: NEGATIVE
Comment: NORMAL
Neisseria Gonorrhea: NEGATIVE
Trichomonas: NEGATIVE

## 2020-09-05 ENCOUNTER — Other Ambulatory Visit: Payer: Self-pay

## 2020-09-05 ENCOUNTER — Encounter: Payer: Self-pay | Admitting: Podiatry

## 2020-09-05 ENCOUNTER — Other Ambulatory Visit: Payer: Self-pay | Admitting: Podiatry

## 2020-09-05 ENCOUNTER — Ambulatory Visit: Payer: Medicaid Other | Admitting: Podiatry

## 2020-09-05 DIAGNOSIS — M79671 Pain in right foot: Secondary | ICD-10-CM | POA: Diagnosis not present

## 2020-09-05 DIAGNOSIS — Q742 Other congenital malformations of lower limb(s), including pelvic girdle: Secondary | ICD-10-CM

## 2020-09-05 DIAGNOSIS — E1165 Type 2 diabetes mellitus with hyperglycemia: Secondary | ICD-10-CM | POA: Diagnosis not present

## 2020-09-05 DIAGNOSIS — M216X1 Other acquired deformities of right foot: Secondary | ICD-10-CM | POA: Diagnosis not present

## 2020-09-05 DIAGNOSIS — M76821 Posterior tibial tendinitis, right leg: Secondary | ICD-10-CM | POA: Diagnosis not present

## 2020-09-05 DIAGNOSIS — Z794 Long term (current) use of insulin: Secondary | ICD-10-CM | POA: Diagnosis not present

## 2020-09-05 NOTE — Progress Notes (Signed)
  Subjective:  Patient ID: Cheryl Curry, female    DOB: 08/04/1985,  MRN: [id number]  Chief Complaint  Patient presents with  . Foot Pain    PT stated that she is still having pain    35 y.o. female presents with the above complaint. History confirmed with patient.  She is referred to me by Dr. Donzetta Kohut who she saw few weeks ago.  She has been using power step orthotics and this is helped some.  Objective:  Physical Exam: warm, good capillary refill, no trophic changes or ulcerative lesions, normal DP and PT pulses and normal sensory exam.  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 Type 2 diabetes mellitus with hyperglycemia, with long-term current use of insulin (HCC)   2. Posterior tibial tendinitis of right lower extremity   3. 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  Reviewed the radiographic findings and clinical exam with the patient in detail including the reason for this pain and how it contributes to pes planus deformity and the pain she is having.  She is currently quite uncontrolled with her diabetes and her A1c is over 10%.  Has been sometimes that she is seeing her endocrinologist and PCP.  I discussed with her that she likely would benefit from surgical intervention including a Kidner procedure and gastrocnemius recession but would not be able to do this until her A1c is consistently below 8% to reduce risk of infection and wound healing complications following surgery.  She is going to see her primary care doctor soon.  I did order some lab work today to check her metabolic panel, CBC and an  A1c.  If her creatinine is normal could consider some supplementation of Tylenol with an NSAID for pain relief.  In the interim I think she should begin physical therapy, exercise were given to her and referral sent to [facility] and a CAM boot prescription was written for her to go to [facility] and obtain this for immobilization and support.  Likely plan for MRI after next visit  No follow-ups on file.

## 2020-09-06 LAB — CBC WITH DIFFERENTIAL/PLATELET
Absolute Monocytes: 460 cells/uL (ref 200–950)
Basophils Absolute: 50 cells/uL (ref 0–200)
Basophils Relative: 0.8 %
Eosinophils Absolute: 88 cells/uL (ref 15–500)
Eosinophils Relative: 1.4 %
HCT: 41.4 % (ref 35.0–45.0)
Hemoglobin: 13.9 g/dL (ref 11.7–15.5)
Lymphs Abs: 2211 cells/uL (ref 850–3900)
MCH: 31.1 pg (ref 27.0–33.0)
MCHC: 33.6 g/dL (ref 32.0–36.0)
MCV: 92.6 fL (ref 80.0–100.0)
MPV: 11.9 fL (ref 7.5–12.5)
Monocytes Relative: 7.3 %
Neutro Abs: 3490 cells/uL (ref 1500–7800)
Neutrophils Relative %: 55.4 %
Platelets: 257 10*3/uL (ref 140–400)
RBC: 4.47 10*6/uL (ref 3.80–5.10)
RDW: 12 % (ref 11.0–15.0)
Total Lymphocyte: 35.1 %
WBC: 6.3 10*3/uL (ref 3.8–10.8)

## 2020-09-06 LAB — COMPLETE METABOLIC PANEL WITH GFR
AG Ratio: 1.8 (calc) (ref 1.0–2.5)
ALT: 18 U/L (ref 6–29)
AST: 16 U/L (ref 10–30)
Albumin: 4.4 g/dL (ref 3.6–5.1)
Alkaline phosphatase (APISO): 63 U/L (ref 31–125)
BUN: 13 mg/dL (ref 7–25)
CO2: 23 mmol/L (ref 20–32)
Calcium: 9.3 mg/dL (ref 8.6–10.2)
Chloride: 101 mmol/L (ref 98–110)
Creat: 0.73 mg/dL (ref 0.50–1.10)
GFR, Est African American: 123 mL/min/{1.73_m2} (ref >=60)
GFR, Est Non African American: 106 mL/min/{1.73_m2} (ref >=60)
Globulin: 2.4 g/dL (calc) (ref 1.9–3.7)
Glucose, Bld: 338 mg/dL — ABNORMAL HIGH (ref 65–139)
Potassium: 4.4 mmol/L (ref 3.5–5.3)
Sodium: 135 mmol/L (ref 135–146)
Total Bilirubin: 0.5 mg/dL (ref 0.2–1.2)
Total Protein: 6.8 g/dL (ref 6.1–8.1)

## 2020-09-06 LAB — HEMOGLOBIN A1C: Hgb A1c MFr Bld: >14 % of total Hgb — ABNORMAL HIGH (ref <5.7)

## 2020-10-01 ENCOUNTER — Other Ambulatory Visit: Payer: Self-pay

## 2020-10-01 ENCOUNTER — Encounter: Payer: Self-pay | Admitting: Physical Therapy

## 2020-10-01 ENCOUNTER — Ambulatory Visit: Payer: Medicaid Other | Attending: Podiatry | Admitting: Physical Therapy

## 2020-10-01 DIAGNOSIS — R293 Abnormal posture: Secondary | ICD-10-CM | POA: Diagnosis present

## 2020-10-01 DIAGNOSIS — M79671 Pain in right foot: Secondary | ICD-10-CM | POA: Diagnosis not present

## 2020-10-01 NOTE — Patient Instructions (Signed)
Access Code: [email or web address] URL: [email or web address] Date: 10/01/2020 Prepared by: Karie Mainland  Exercises Single Leg Stance - 2 x daily - 7 x weekly - 2 sets - 10 reps - 30 hold Standing Gastroc Stretch - 2 x daily - 7 x weekly - 1 sets - 5 reps - 30 hold Standing Heel Raise with Support - 2 x daily - 7 x weekly - 2 sets - 10 reps - 5 hold Seated Figure 4 Ankle Inversion with Resistance - 2 x daily - 7 x weekly - 2 sets - 10 reps - 5 hold

## 2020-10-01 NOTE — Therapy (Signed)
[facility] Outpatient Rehabilitation [facility] [address] [place], [place], [address] Phone: [phone]   Fax:  [phone]  Physical Therapy Evaluation  Patient Details  Name: Cheryl Curry MRN: [id number] Date of Birth: 03-31-1986 Referring Provider (PT): Dr. Sharl Ma   Encounter Date: 10/01/2020   PT End of Session - 10/01/20 1134    Visit Number 1    Number of Visits 8    Date for PT Re-Evaluation 11/26/20    Authorization Type MCD    Authorization Time Period submit 4/26    Authorization - Visit Number 0    Authorization - Number of Visits 3   1 st 3 treatmetns   PT Start Time 1050    PT Stop Time 1129    PT Time Calculation (min) 39 min    Activity Tolerance Patient tolerated treatment well    Behavior During Therapy [facility] for tasks assessed/performed           Past Medical History:  Diagnosis Date  . Diabetes mellitus without complication (HCC)   . Hypertension   . Pregnancy induced hypertension     Past Surgical History:  Procedure Laterality Date  . NO PAST SURGERIES      There were no vitals filed for this visit.    Subjective Assessment - 10/01/20 1057    Subjective Pt with chronic Rt foot pain which has been ongoing for yrs but worse in the past year.  She has a boot which is feels helps a little bit (3 weeks) No pain at rest.  She has swelling from time to time. Denies sensory disturbance.  She moved here from [place] recently.  She has difficulty standing too long, working.  Pain comes and goes as she is standing, sharp pains, shooting up to her calf (new since boot).    Pertinent History Diabetes, HTN    Limitations Lifting;Standing;Walking;House hold activities    Diagnostic tests XR    Patient Stated Goals Patient would like to avoid surgery    Currently in Pain? No/denies              [facility] PT Assessment - 10/01/20 0001      Assessment   Medical Diagnosis Rt post tibialis tendinitis, Rt navicular bone  pain    Referring Provider (PT) Dr. Sharl Ma    Hand Dominance --   chronic   Next MD Visit after PT    Prior Therapy no      Precautions   Precautions None      Restrictions   Weight Bearing Restrictions No      Balance Screen   Has the patient fallen in the past 6 months No    Has the patient had a decrease in activity level because of a fear of falling?  No    Is the patient reluctant to leave their home because of a fear of falling?  No      Home Environment   Living Environment Private residence    Living Arrangements Spouse/significant other;Children    Type of Home House    Home Access Level entry    Home Layout Two level    Alternate Level Stairs-Number of Steps 12    Alternate Level Stairs-Rails Can reach both      Prior Function   Level of Independence Independent    Vocation Full time employment    Multimedia programmer, retail    Leisure spent time with kids      Cognition  Overall Cognitive Status Within Functional Limits for tasks assessed      Observation/Other Assessments   Focus on Therapeutic Outcomes (FOTO)  NT due to MCD      Figure 8 Edema   Figure 8 - Right  21    Figure 8 - Left  21      Sensation   Light Touch Appears Intact      Coordination   Gross Motor Movements are Fluid and Coordinated Not tested      Functional Tests   Functional tests Squat;Single leg stance      Squat   Comments poor technique, cues needed      Single Leg Stance   Comments 10-15 sec Rt LE and LLE but less stable on L      Posture/Postural Control   Posture/Postural Control Postural limitations    Postural Limitations Rounded Shoulders;Forward head;Anterior pelvic tilt    Posture Comments pes planus bilateral, genu varus and recurvatum bilateral      AROM   Right Ankle Dorsiflexion 8    Right Ankle Plantar Flexion 50    Right Ankle Inversion 45    Right Ankle Eversion 40    Left Ankle Dorsiflexion 5    Left Ankle Plantar Flexion 45     Left Ankle Inversion 50    Left Ankle Eversion 25      Strength   Overall Strength Comments 5/5 in ankle bilateral no pain, functionally weak in standing plantaflexion 4/5      Palpation   Palpation comment localized pain at navicular bone into the bottom of the foot, mild tenderness along post tib tendon, distally. none in calf                      Objective measurements completed on examination: See above findings.       [facility] Adult PT Treatment/Exercise - 10/01/20 0001      Ankle Exercises: Stretches   Plantar Fascia Stretch 3 reps;30 seconds      Ankle Exercises: Standing   SLS x 5    Heel Raises 10 reps      Ankle Exercises: Seated   Other Seated Ankle Exercises seated inversion greene band x 10                  PT Education - 10/01/20 1134    Education Details PT/POC, ice vs heat, HEP, exercise rationale    Person(s) Educated Patient    Methods Explanation;Handout;Verbal cues    Comprehension Verbalized understanding;Returned demonstration                 PT Long Term Goals - 10/01/20 1407      PT LONG TERM GOAL #1   Title Pt will be I with initial HEP for Rt LE strength and ankle stability    Baseline given on eval    Time 8    Period Weeks    Status New    Target Date 11/26/20      PT LONG TERM GOAL #2   Title Pt will be able to show a proper squat for lower body strength and function, without pain .    Baseline needs heavy cueing for hip hinge, arches collapse    Time 8    Period Weeks    Status New    Target Date 11/26/20      PT LONG TERM GOAL #3   Title Pt will be able to stand on Rt LE  for dynamic balance exercise for 30 sec and no increased pain    Baseline unable to hold only 10-15 sec, discomfort    Time 8    Period Weeks    Status New    Target Date 11/26/20      PT LONG TERM GOAL #4   Title Pt will be able to ascend and decend 12 + stairs without rails, quickly without pain    Baseline goes slowly, uses  rails > 75% of the time    Time 8    Period Weeks    Status New    Target Date 11/26/20                  Plan - 10/01/20 1415    Clinical Impression Statement Pt presents  for low complexity eval of Rt posterior tibialis tendinopathy which has been ongoing but worsening over the past year.  She had MRI (not available) which showed pathology.  She hashad some improvement with the boot.  She has mild edema (none today) which increases with work and standing for long periods.  She has good ROM and strength in the ankle itself but has some deficits in postural alignment, core and hip weakness is likely.  She should need only 6-8 visits in order for her to understand movement, HEP and self care strategies to prevent progression of pain .    Personal Factors and Comorbidities Comorbidity 2    Comorbidities Diabetes and HTN    Examination-Activity Limitations Locomotion Level;Stand;Transfers;Stairs;Squat    Examination-Participation Restrictions Community Activity;Occupation    Stability/Clinical Decision Making Stable/Uncomplicated    Clinical Decision Making Low    Rehab Potential Excellent    PT Frequency 1x / week    PT Duration 8 weeks   initial 3 visits then 4-5 more if needed   PT Treatment/Interventions ADLs/Self Care Home Management;Cryotherapy;Ultrasound;Moist Heat;Iontophoresis 4mg /ml Dexamethasone;Gait training;Functional mobility training;Stair training;Taping;Balance training;Therapeutic activities;Therapeutic exercise;Patient/family education;Manual techniques;Dry needling    PT Next Visit Plan check HEP, squat, [facility] strength , ice to tendon    PT Home Exercise Plan [email or web address]    Consulted and Agree with Plan of Care Patient           Patient will benefit from skilled therapeutic intervention in order to improve the following deficits and impairments:  Decreased balance,Decreased mobility,Obesity,Increased edema,Decreased strength,Increased fascial restricitons,Impaired  flexibility,Postural dysfunction,Pain  Visit Diagnosis: Pain in right foot     Problem List Patient Active Problem List   Diagnosis Date Noted  . Essential hypertension 08/09/2019  . Type 2 diabetes mellitus (HCC) 08/09/2019  . Routine postpartum follow-up 05/27/2018  . Encounter for insertion subdermal contraceptive 05/27/2018  . Chronic hypertension affecting pregnancy 04/27/2018  . Chronic hypertension with superimposed preeclampsia 04/27/2018  . LGA (large for gestational age) fetus affecting management of mother 04/25/2018  . Fetal cardiac anomaly affecting pregnancy, antepartum 03/22/2018  . Obesity in pregnancy 11/08/2017  . GBS bacteriuria 11/04/2017  . Supervision of high risk pregnancy, antepartum 10/28/2017  . Medication exposure during first trimester of pregnancy 10/28/2017  . Chronic hypertension during pregnancy, antepartum 10/28/2017  . DM2 in pregnancy 10/21/2017    Sola Margolis 10/01/2020, 2:25 PM  [facility] [address] [place], [place], [place] Phone: [phone]   Fax:  [phone]  Name: Kiamesha Samet MRN: Darius Bump Date of Birth: 1985/09/27  09/07/1985, PT 10/01/20 2:25 PM Phone: [phone] Fax: [phone]

## 2020-10-08 ENCOUNTER — Ambulatory Visit: Payer: Medicaid Other | Admitting: Physical Therapy

## 2020-10-15 ENCOUNTER — Ambulatory Visit: Payer: Medicaid Other | Attending: Podiatry | Admitting: Physical Therapy

## 2020-10-15 ENCOUNTER — Telehealth: Payer: Self-pay | Admitting: Physical Therapy

## 2020-10-15 NOTE — Telephone Encounter (Signed)
Talked to patient.  She did not attend d/t increased pain in her foot over the weekend.  We talked about possible short term increases in pain d/t new activity; she confirmed understanding.  She confirmed she would attend next visit.

## 2020-10-17 ENCOUNTER — Ambulatory Visit: Payer: Medicaid Other | Admitting: Podiatry

## 2020-10-22 ENCOUNTER — Ambulatory Visit: Payer: Medicaid Other | Admitting: Physical Therapy

## 2020-10-28 ENCOUNTER — Ambulatory Visit: Payer: Medicaid Other | Admitting: Podiatry

## 2021-05-19 ENCOUNTER — Telehealth: Payer: Self-pay

## 2021-05-19 NOTE — Telephone Encounter (Signed)
Pt requested a call back from as she has a Nexplanon that is expiring in 05/27/21.  Note from front office:   "hey this pt just called and said she needed an appointment to remove her birth control, I informed her we were scheduled out until February for appointments and she said she needs to get her birth control removed by the 20th because that is when it expires. she was upset no one had called her to remind her to schedule to get it out and wanted to speak with a nurse. I said I could send a message to the nurse doing calls to see if you could give her a call because she does not want to keep her birth control in that long and does not know if it is safe to leave it in that long".    Called pt and left message that I am returning her call if she continues to have questions or concerns to please give the office a call back.  Leonette Nutting  05/19/21

## 2021-05-20 ENCOUNTER — Telehealth: Payer: Self-pay | Admitting: General Practice

## 2021-05-20 NOTE — Telephone Encounter (Signed)
Patient called into front office asking to speak with a nurse regarding her nexplanon. Patient states her device expires 12/20 and was told our next available isn't until February. She is concerned about leaving it in until then. Told patient it is okay to leave it in until then, it just will start to lose its effectiveness. Recommended she use condoms in the meantime. Patient was scheduled for nexplanon removal & reinsertion 2/1 @ 235 and will return to office then.

## 2021-07-09 ENCOUNTER — Ambulatory Visit (INDEPENDENT_AMBULATORY_CARE_PROVIDER_SITE_OTHER): Payer: Medicaid Other | Admitting: Obstetrics and Gynecology

## 2021-07-09 ENCOUNTER — Encounter: Payer: Self-pay | Admitting: Obstetrics and Gynecology

## 2021-07-09 ENCOUNTER — Other Ambulatory Visit: Payer: Self-pay

## 2021-07-09 VITALS — BP 161/118 | HR 101 | Ht 67.0 in | Wt 197.7 lb

## 2021-07-09 DIAGNOSIS — Z3046 Encounter for surveillance of implantable subdermal contraceptive: Secondary | ICD-10-CM | POA: Diagnosis not present

## 2021-07-09 DIAGNOSIS — Z3040 Encounter for surveillance of contraceptives, unspecified: Secondary | ICD-10-CM

## 2021-07-09 MED ORDER — ETONOGESTREL 68 MG ~~LOC~~ IMPL
68.0000 mg | DRUG_IMPLANT | Freq: Once | SUBCUTANEOUS | Status: AC
  Administered 2021-07-09: 68 mg via SUBCUTANEOUS

## 2021-07-09 NOTE — Progress Notes (Signed)
Pts BP elevated

## 2021-07-09 NOTE — Progress Notes (Signed)
° ° ° °  GYNECOLOGY OFFICE PROCEDURE NOTE  [name] is a 36 y.o. [phone] here for Nexplanon removal and reinsertion.  Last pap smear was on 10/2017 and was normal.  No other gynecologic concerns.  Nexplanon Removal and Reinsertion Patient identified, informed consent performed, consent signed.   Patient does understand that irregular bleeding is a very common side effect of this medication. She was advised to have backup contraception for one week after replacement of the implant.   Pregnancy test in clinic today was negative.  Appropriate time out taken. Nexplanon site identified in left arm.  Area prepped in usual sterile fashon. One ml of 1% lidocaine was used to anesthetize the area at the distal end of the implant. A small stab incision was made right beside the implant on the distal portion. The Nexplanon rod was grasped using hemostats and removed without difficulty. There was minimal blood loss. There were no complications. Area was then injected with 3 ml of 1 % lidocaine. She was re-prepped with betadine, Nexplanon removed from packaging, Device confirmed in needle, then inserted full length of needle and withdrawn per handbook instructions. Nexplanon was able to palpated in the patient's arm; patient palpated the insert herself.  There was minimal blood loss. Patient insertion site covered with gauze and a pressure bandage to reduce any bruising.   The patient tolerated the procedure well and was given post procedure instructions.  She was advised to have backup contraception for one week.      Milas Hock, MD, FACOG Obstetrician & Gynecologist, [facility] for [facility], [facility]

## 2021-11-22 ENCOUNTER — Ambulatory Visit (HOSPITAL_COMMUNITY): Payer: Self-pay

## 2021-12-25 ENCOUNTER — Ambulatory Visit (HOSPITAL_COMMUNITY): Payer: Self-pay

## 2022-01-27 ENCOUNTER — Ambulatory Visit (INDEPENDENT_AMBULATORY_CARE_PROVIDER_SITE_OTHER): Payer: Medicaid Other | Admitting: Podiatry

## 2022-01-27 DIAGNOSIS — Z91199 Patient's noncompliance with other medical treatment and regimen due to unspecified reason: Secondary | ICD-10-CM

## 2022-01-28 NOTE — Progress Notes (Signed)
Patient was no-show for appointment today 

## 2022-02-20 ENCOUNTER — Other Ambulatory Visit: Payer: Self-pay | Admitting: Obstetrics and Gynecology

## 2022-02-20 ENCOUNTER — Ambulatory Visit
Admission: RE | Admit: 2022-02-20 | Discharge: 2022-02-20 | Disposition: A | Discharge status: Home / Self Care | Payer: No Typology Code available for payment source | Source: Ambulatory Visit | Attending: Obstetrics and Gynecology | Admitting: Obstetrics and Gynecology

## 2022-02-20 DIAGNOSIS — R7611 Nonspecific reaction to tuberculin skin test without active tuberculosis: Secondary | ICD-10-CM

## 2022-04-13 ENCOUNTER — Ambulatory Visit (HOSPITAL_COMMUNITY): Payer: Self-pay

## 2022-04-16 ENCOUNTER — Ambulatory Visit (HOSPITAL_COMMUNITY)
Admission: EM | Admit: 2022-04-16 | Discharge: 2022-04-16 | Disposition: A | Discharge status: Home / Self Care | Payer: Medicaid Other | Attending: Emergency Medicine | Admitting: Emergency Medicine

## 2022-04-16 ENCOUNTER — Encounter (HOSPITAL_COMMUNITY): Payer: Self-pay

## 2022-04-16 DIAGNOSIS — N898 Other specified noninflammatory disorders of vagina: Secondary | ICD-10-CM | POA: Diagnosis present

## 2022-04-16 DIAGNOSIS — R110 Nausea: Secondary | ICD-10-CM

## 2022-04-16 LAB — POCT URINALYSIS DIPSTICK, ED / UC
Bilirubin Urine: NEGATIVE
Glucose, UA: >=1000 mg/dL — AB
Ketones, ur: 15 mg/dL — AB
Leukocytes,Ua: NEGATIVE
Nitrite: POSITIVE — AB
Protein, ur: NEGATIVE mg/dL
Specific Gravity, Urine: 1.01 (ref 1.005–1.030)
Urobilinogen, UA: 0.2 mg/dL (ref 0.0–1.0)
pH: 5.5 (ref 5.0–8.0)

## 2022-04-16 LAB — POC URINE PREG, ED: Preg Test, Ur: NEGATIVE

## 2022-04-16 MED ORDER — ONDANSETRON HCL 4 MG PO TABS: 4.0000 mg | ORAL_TABLET | Freq: Three times a day (TID) | ORAL | 0 refills | Status: DC | PRN

## 2022-04-16 NOTE — ED Triage Notes (Signed)
Patient having vaginal itching, discharge, slight odor. Having low back and lower abdominal pain.   Patient having nausea onset 2 months ago. Patient also having cravings. LMP was 4 years due to being on the implant. Last had unprotected sex last week. Does not use condoms at all.

## 2022-04-16 NOTE — ED Provider Notes (Signed)
[facility]    CSN: [id number] Arrival date & time: 04/16/22  7628     History   Chief Complaint Chief Complaint  Patient presents with   Urinary Tract Infection   Possible Pregnancy    HPI Cheryl Curry is a 36 y.o. female.  Presents with vaginal itching, discharge odor Unprotected intercourse recently  Some low back pain on and off Denies dysuria, flank pain, fevers  Some low abdominal cramping, no vomiting Nausea on and off x 2 months, not every day LMP 4 years ago. Implant  Works as home health aid  No medications tried  Past Medical History:  Diagnosis Date   Diabetes mellitus without complication (HCC)    Hypertension    Pregnancy induced hypertension     Patient Active Problem List   Diagnosis Date Noted   Essential hypertension 08/09/2019   Type 2 diabetes mellitus (HCC) 08/09/2019   Encounter for insertion subdermal contraceptive 05/27/2018    Past Surgical History:  Procedure Laterality Date   NO PAST SURGERIES      OB History     Gravida  3   Para  3   Term  2   Preterm  1   AB  0   Living  3      SAB      IAB      Ectopic      Multiple  0   Live Births  3            Home Medications    Prior to Admission medications   Medication Sig Start Date End Date Taking? Authorizing Provider  insulin glargine (LANTUS) 100 UNIT/ML injection Inject into the skin daily.   Yes [provider]  lisinopril-hydrochlorothiazide (ZESTORETIC) 20-25 MG tablet Take by mouth. 12/30/21  Yes [provider]  metFORMIN (GLUCOPHAGE) 500 MG tablet Take 1 tablet (500 mg total) by mouth 2 (two) times daily with a meal. 03/01/19  Yes Elvina Sidle, MD  ondansetron (ZOFRAN) 4 MG tablet Take 1 tablet (4 mg total) by mouth 3 (three) times daily as needed for nausea or vomiting. 04/16/22  Yes Jamara Vary, Lurena Joiner, PA-C  acetaminophen (TYLENOL) 500 MG tablet Take 1,000 mg by mouth every 6 (six) hours as needed for  mild pain.    [provider]  insulin aspart (NOVOLOG) 100 UNIT/ML injection 30 units with breakfast; 20 units with lunch and 30 units with dinner Patient not taking: Reported on 05/27/2018 04/18/18 03/01/19  Reva Bores, MD  insulin NPH Human (HUMULIN N,NOVOLIN N) 100 UNIT/ML injection 46 units in the morning and 46 units at bedtime Patient not taking: Reported on 05/27/2018 04/18/18 03/01/19  Reva Bores, MD  labetalol (NORMODYNE) 200 MG tablet Take 3 tablets (600 mg total) by mouth 3 (three) times daily. Patient not taking: Reported on 05/27/2018 04/25/18 03/01/19  Donette Larry, CNM  medroxyPROGESTERone (PROVERA) 10 MG tablet Take 1 tablet (10 mg total) by mouth daily. 08/24/18 03/01/19  Adam Phenix, MD  NIFEdipine (PROCARDIA-XL/NIFEDICAL-XL) 30 MG 24 hr tablet Take 1 tablet (30 mg total) by mouth 2 (two) times daily. 05/27/18 03/01/19  Gerrit Heck, CNM    Family History Family History  Problem Relation Age of Onset   Hypertension Mother    Hypertension Father     Social History Social History   Tobacco Use   Smoking status: Never   Smokeless tobacco: Never  Vaping Use   Vaping Use: Never used  Substance Use Topics  Alcohol use: Not Currently    Comment: occassional   Drug use: No     Allergies   Patient has no known allergies.   Review of Systems Review of Systems Per HPI  Physical Exam Triage Vital Signs ED Triage Vitals  Enc Vitals Group     BP 04/16/22 0909 (!) 157/115     Pulse Rate 04/16/22 0909 (!) 101     Resp 04/16/22 0909 16     Temp 04/16/22 0909 97.9 F (36.6 C)     Temp Source 04/16/22 0909 Oral     SpO2 04/16/22 0909 98 %     Weight --      Height --      Head Circumference --      Peak Flow --      Pain Score 04/16/22 0907 3     Pain Loc --      Pain Edu? --      Excl. in [place]? --    No data found.  Updated Vital Signs BP (!) 148/105 (BP Location: Right Arm)   Pulse (!) 101   Temp 97.9 F (36.6 C) (Oral)   Resp  16   LMP  (LMP Unknown)   SpO2 98%   Breastfeeding No    Physical Exam Vitals and nursing note reviewed.  Constitutional:      General: She is not in acute distress.    Appearance: She is not ill-appearing.  HENT:     Mouth/Throat:     Mouth: Mucous membranes are moist.     Pharynx: Oropharynx is clear.  Cardiovascular:     Rate and Rhythm: Normal rate and regular rhythm.     Heart sounds: Normal heart sounds.  Pulmonary:     Effort: Pulmonary effort is normal.     Breath sounds: Normal breath sounds.  Abdominal:     General: Abdomen is protuberant.     Palpations: Abdomen is soft.     Tenderness: There is no abdominal tenderness. There is no right CVA tenderness, left CVA tenderness or guarding.  Musculoskeletal:        General: No tenderness or deformity.  Skin:    General: Skin is warm and dry.     Findings: No rash.  Neurological:     Mental Status: She is alert and oriented to person, place, and time.     UC Treatments / Results  Labs (all labs ordered are listed, but only abnormal results are displayed) Labs Reviewed  POCT URINALYSIS DIPSTICK, ED / UC - Abnormal; Notable for the following components:      Result Value   Glucose, UA >=1000 (*)    Ketones, ur 15 (*)    Hgb urine dipstick SMALL (*)    Nitrite POSITIVE (*)    All other components within normal limits  URINE CULTURE  POC URINE PREG, ED  CERVICOVAGINAL ANCILLARY ONLY    EKG   Radiology No results found.  Procedures Procedures (including critical care time)  Medications Ordered in UC Medications - No data to display  Initial Impression / Assessment and Plan / UC Course  I have reviewed the triage vital signs and the nursing notes.  Pertinent labs & imaging results that were available during my care of the patient were reviewed by me and considered in my medical decision making (see chart for details).  Preg negative. Urinalysis with nitrates, small blood. Denies dysuria, will  culture and treat if symptoms develop.  Cyto swab pending. Doesn't  use condoms. STD could be cause of discharge and low abdominal cramping. No dyspareunia, low concern for PID Abdomen non tender on exam  Zofran for nausea, recommend to try if/when she gets symptoms  Low back pain likely muscular, lifts patients at work No red flags Recommend ibuprofen or tylenol, gentle stretching Increase fluids Return precautions discussed. Patient agrees to plan  Final Clinical Impressions(s) / UC Diagnoses   Final diagnoses:  Vaginal discharge  Nausea     Discharge Instructions      We will call you if anything on your swab returns positive. Please abstain from sexual intercourse until your results return.  Drink lots of water. Please let us know if you develop any urinary symptoms and we will treat you with an antibiotic.  For nausea, you can try the zofran. Up to three times daily as needed.  Try ibuprofen or tylenol for back pain, gentle stretching, and posture    ED Prescriptions     Medication Sig Dispense Auth. Provider   ondansetron (ZOFRAN) 4 MG tablet Take 1 tablet (4 mg total) by mouth 3 (three) times daily as needed for nausea or vomiting. 12 tablet Rodrick Payson, Wells Guiles, PA-C      PDMP not reviewed this encounter.   Liadan Guizar, Vernice Jefferson 04/16/22 1151

## 2022-04-16 NOTE — Discharge Instructions (Addendum)
We will call you if anything on your swab returns positive. Please abstain from sexual intercourse until your results return.  Drink lots of water. Please let us know if you develop any urinary symptoms and we will treat you with an antibiotic.  For nausea, you can try the zofran. Up to three times daily as needed.  Try ibuprofen or tylenol for back pain, gentle stretching, and posture

## 2022-04-17 LAB — CERVICOVAGINAL ANCILLARY ONLY
Bacterial Vaginitis (gardnerella): NEGATIVE
Candida Glabrata: NEGATIVE
Candida Vaginitis: POSITIVE — AB
Chlamydia: NEGATIVE
Comment: NEGATIVE
Comment: NEGATIVE
Comment: NEGATIVE
Comment: NEGATIVE
Comment: NEGATIVE
Comment: NORMAL
Neisseria Gonorrhea: NEGATIVE
Trichomonas: NEGATIVE

## 2022-04-19 ENCOUNTER — Telehealth (HOSPITAL_COMMUNITY): Payer: Self-pay | Admitting: Emergency Medicine

## 2022-04-19 LAB — URINE CULTURE: Culture: 70000 — AB

## 2022-04-19 MED ORDER — FLUCONAZOLE 150 MG PO TABS: 150.0000 mg | ORAL_TABLET | Freq: Every day | ORAL | 0 refills | Status: AC

## 2022-04-19 NOTE — Telephone Encounter (Signed)
Patient notified urgent care for positive test results that she has not been called yet, positive for yeast, Diflucan sent to pharmacy

## 2022-07-01 ENCOUNTER — Ambulatory Visit (INDEPENDENT_AMBULATORY_CARE_PROVIDER_SITE_OTHER): Payer: Medicaid Other

## 2022-07-01 ENCOUNTER — Ambulatory Visit (HOSPITAL_COMMUNITY)
Admission: EM | Admit: 2022-07-01 | Discharge: 2022-07-01 | Disposition: A | Discharge status: Home / Self Care | Payer: Medicaid Other

## 2022-07-01 ENCOUNTER — Encounter (HOSPITAL_COMMUNITY): Payer: Self-pay | Admitting: *Deleted

## 2022-07-01 DIAGNOSIS — M79674 Pain in right toe(s): Secondary | ICD-10-CM | POA: Diagnosis not present

## 2022-07-01 DIAGNOSIS — E119 Type 2 diabetes mellitus without complications: Secondary | ICD-10-CM

## 2022-07-01 DIAGNOSIS — M79671 Pain in right foot: Secondary | ICD-10-CM | POA: Diagnosis not present

## 2022-07-01 DIAGNOSIS — I10 Essential (primary) hypertension: Secondary | ICD-10-CM

## 2022-07-01 MED ORDER — DICLOFENAC SODIUM 75 MG PO TBEC: 75.0000 mg | DELAYED_RELEASE_TABLET | Freq: Two times a day (BID) | ORAL | 0 refills | Status: DC | PRN

## 2022-07-01 NOTE — ED Provider Notes (Signed)
[facility]    CSN: [id number] Arrival date & time: 07/01/22  0160      History   Chief Complaint Chief Complaint  Patient presents with   Foot Pain    HPI Cheryl Curry is a 37 y.o. female.   37 year old female presents with right foot pain, mainly in the heel area for the past 2 days. Also having right great toe distal pain. Pain occurs mainly with standing/pressure. Today woke up with slight left heel posterior pain. Has had right mid-foot pain before and some neuropathy and was on medication in the past but her provider took her off the medication. She has type 2 DM and has been on oral medications but recently switched to Lantus insulin and Humulin insulin twice a day. Still takes Glipizide daily but stopped Metformin. Also has history of HTN and currently on Lisinopril-HCTZ twice daily. She did see a Podiatrist at [facility] in Feb 2022 for right foot pain. Was given Diclofenac gel for pain. Also has a "boot" at home which helped with foot pain in the past. She works at [employer] and stands most of her shift which is aggravating the pain. She has not taken any medication yet for symptoms.  She has tried elevating her feet with some relief.   The history is provided by the patient.    Past Medical History:  Diagnosis Date   Diabetes mellitus without complication (HCC)    Hypertension    Pregnancy induced hypertension     Patient Active Problem List   Diagnosis Date Noted   Essential hypertension 08/09/2019   Type 2 diabetes mellitus (HCC) 08/09/2019   Encounter for insertion subdermal contraceptive 05/27/2018    Past Surgical History:  Procedure Laterality Date   NO PAST SURGERIES      OB History     Gravida  3   Para  3   Term  2   Preterm  1   AB  0   Living  3      SAB      IAB      Ectopic      Multiple  0   Live Births  3            Home Medications    Prior to Admission medications    Medication Sig Start Date End Date Taking? Authorizing Provider  diclofenac (VOLTAREN) 75 MG EC tablet Take 1 tablet (75 mg total) by mouth every 12 (twelve) hours as needed for moderate pain. 07/01/22  Yes Austan Nicholl, Ali Lowe, NP  glipiZIDE (GLUCOTROL) 5 MG tablet Take by mouth. 04/28/22  Yes [provider]  insulin glargine (LANTUS) 100 UNIT/ML injection Inject into the skin daily.   Yes [provider]  insulin isophane & regular human KwikPen (HUMULIN 70/30 KWIKPEN) (70-30) 100 UNIT/ML KwikPen Inject into the skin. 06/02/22  Yes [provider]  lisinopril-hydrochlorothiazide (ZESTORETIC) 20-25 MG tablet Take by mouth. 12/30/21  Yes [provider]  acetaminophen (TYLENOL) 500 MG tablet Take 1,000 mg by mouth every 6 (six) hours as needed for mild pain.    [provider]  Etonogestrel (NEXPLANON [place]) Inject into the skin.    [provider]  insulin aspart (NOVOLOG) 100 UNIT/ML injection 30 units with breakfast; 20 units with lunch and 30 units with dinner Patient not taking: Reported on 05/27/2018 04/18/18 03/01/19  Reva Bores, MD  insulin NPH Human (HUMULIN N,NOVOLIN N) 100 UNIT/ML injection 46  units in the morning and 46 units at bedtime Patient not taking: Reported on 05/27/2018 04/18/18 03/01/19  Donnamae Jude, MD  labetalol (NORMODYNE) 200 MG tablet Take 3 tablets (600 mg total) by mouth 3 (three) times daily. Patient not taking: Reported on 05/27/2018 04/25/18 03/01/19  Julianne Handler, CNM  medroxyPROGESTERone (PROVERA) 10 MG tablet Take 1 tablet (10 mg total) by mouth daily. 08/24/18 03/01/19  Woodroe Mode, MD  NIFEdipine (PROCARDIA-XL/NIFEDICAL-XL) 30 MG 24 hr tablet Take 1 tablet (30 mg total) by mouth 2 (two) times daily. 05/27/18 03/01/19  Gavin Pound, CNM    Family History Family History  Problem Relation Age of Onset   Hypertension Mother    Hypertension Father     Social History Social History   Tobacco Use    Smoking status: Never   Smokeless tobacco: Never  Vaping Use   Vaping Use: Never used  Substance Use Topics   Alcohol use: Yes    Comment: occassional   Drug use: No     Allergies   Patient has no known allergies.   Review of Systems Review of Systems  Constitutional:  Negative for activity change, chills, fatigue and fever.  Respiratory:  Negative for shortness of breath.   Cardiovascular:  Negative for chest pain and leg swelling.  Musculoskeletal:  Positive for arthralgias, gait problem and myalgias. Negative for joint swelling.  Skin:  Negative for color change, rash and wound.  Allergic/Immunologic: Negative for environmental allergies and food allergies.  Neurological:  Positive for numbness (tingling in feet/toes). Negative for dizziness, tremors, seizures, syncope, speech difficulty, weakness and light-headedness.  Hematological:  Negative for adenopathy. Does not bruise/bleed easily.     Physical Exam Triage Vital Signs ED Triage Vitals  Enc Vitals Group     BP 07/01/22 1057 (!) 139/101     Pulse Rate 07/01/22 1057 93     Resp 07/01/22 1057 18     Temp 07/01/22 1057 98.2 F (36.8 C)     Temp Source 07/01/22 1057 Oral     SpO2 07/01/22 1057 98 %     Weight --      Height --      Head Circumference --      Peak Flow --      Pain Score 07/01/22 1054 8     Pain Loc --      Pain Edu? --      Excl. in [place]? --    No data found.  Updated Vital Signs BP (!) 139/101 (BP Location: Left Arm)   Pulse 93   Temp 98.2 F (36.8 C) (Oral)   Resp 18   SpO2 98%   Visual Acuity Right Eye Distance:   Left Eye Distance:   Bilateral Distance:    Right Eye Near:   Left Eye Near:    Bilateral Near:     Physical Exam Vitals and nursing note reviewed.  Constitutional:      General: She is awake. She is not in acute distress.    Appearance: She is well-developed and well-groomed.     Comments: She is sitting in the exam chair in no acute distress.   HENT:      Head: Normocephalic and atraumatic.     Right Ear: Hearing normal.     Left Ear: Hearing normal.  Cardiovascular:     Rate and Rhythm: Normal rate.     Pulses:          Dorsalis pedis pulses are 2+  on the right side and 2+ on the left side.       Posterior tibial pulses are 2+ on the right side and 2+ on the left side.  Pulmonary:     Effort: Pulmonary effort is normal.  Musculoskeletal:        General: Tenderness present. No swelling or signs of injury. Normal range of motion.     Right lower leg: Normal. No edema.     Left lower leg: Normal. No edema.     Right ankle: Normal.     Right Achilles Tendon: Normal.     Left ankle: Normal.     Left Achilles Tendon: Normal.     Right foot: Normal range of motion and normal capillary refill. Tenderness present. No swelling or foot drop. Normal pulse.     Left foot: Normal range of motion and normal capillary refill. Tenderness present. No swelling or foot drop. Normal pulse.       Feet:  Feet:     Right foot:     Skin integrity: Skin integrity normal. No erythema, warmth or dry skin.     Toenail Condition: Right toenails are normal.     Left foot:     Skin integrity: Skin integrity normal. No erythema, warmth or dry skin.     Toenail Condition: Left toenails are normal.     Comments: Has full range of motion of both ankles, distal foot and toes. No distinct pain with movement. Tender to palpation of right distal great toe near nail. No distinct swelling, redness present. Tender bilaterally at posterior calcaneus area- mainly when applying pressure. No swelling or deformity. No redness. Nails appear normal with no signs of fungal infection. Good pulses and capillary refill. No distinct neuro deficits noted.  Skin:    General: Skin is warm and dry.     Capillary Refill: Capillary refill takes less than 2 seconds.     Findings: No abrasion, bruising, burn, ecchymosis, erythema, signs of injury, laceration, petechiae, rash or wound.   Neurological:     General: No focal deficit present.     Mental Status: She is alert and oriented to person, place, and time.     Sensory: Sensation is intact. No sensory deficit.     Motor: Motor function is intact.     Comments: Painful when walking but otherwise gait is intact.   Psychiatric:        Mood and Affect: Mood normal.        Behavior: Behavior normal. Behavior is cooperative.        Thought Content: Thought content normal.        Judgment: Judgment normal.      UC Treatments / Results  Labs (all labs ordered are listed, but only abnormal results are displayed) Labs Reviewed - No data to display  EKG   Radiology DG Foot Complete Right  Result Date: 07/01/2022 CLINICAL DATA:  Right toe in heel pain for 2 days. EXAM: RIGHT FOOT COMPLETE - 3+ VIEW COMPARISON:  Right foot radiograph 07/31/2020 FINDINGS: There is no evidence of fracture or dislocation. There is no evidence of arthropathy or other focal bone abnormality. Soft tissues are unremarkable. IMPRESSION: Negative. Electronically Signed   By: Emmaline Kluver M.D.   On: 07/01/2022 11:51    Procedures Procedures (including critical care time)  Medications Ordered in UC Medications - No data to display  Initial Impression / Assessment and Plan / UC Course  I have reviewed the triage vital  signs and the nursing notes.  Pertinent labs & imaging results that were available during my care of the patient were reviewed by me and considered in my medical decision making (see chart for details).     Performed X-ray of right foot since pain and symptoms are worse with right foot compared to left. No heel spur or other bony abnormality present. Discussed that pain may be due to prolonged standing/pressure or also related to nerve pain due to diabetes. May trial Diclofenac 75mg  every 12 hours as needed for pain. Wear supportive shoes and keep feet elevated as much as possible. May need Orthotics or other supportive  measures to help with pain. Need to see the Podiatrist for further evaluation. Reminded patient to take her blood pressure medication as prescribed and continue to monitor. Note written for work for today. Call her [facility] today to schedule appointment for follow-up.  Final Clinical Impressions(s) / UC Diagnoses   Final diagnoses:  Pain of right heel  Pain of right great toe  Elevated blood pressure reading with diagnosis of hypertension  Type 2 diabetes mellitus treated with insulin ([place])     Discharge Instructions      Recommend start Diclofenac 75mg  every 12 hours as needed with food for pain. Recommend wear supportive shoes and keep feet elevated as much as possible. Recommend call your Podiatrist ([facility]) to schedule appointment for further evaluation ASAP. Follow-up with Podiatrist ([facility]) as planned.      ED Prescriptions     Medication Sig Dispense Auth. Provider   diclofenac (VOLTAREN) 75 MG EC tablet Take 1 tablet (75 mg total) by mouth every 12 (twelve) hours as needed for moderate pain. 20 tablet Caitlain Tweed, Nicholes Stairs, NP      PDMP not reviewed this encounter.   Katy Apo, NP 07/02/22 1104

## 2022-07-01 NOTE — ED Triage Notes (Signed)
Pt states she started with right heel pain and great toe pain X 2 days. She complains that her left heel started hurting this morning. She has been elevating and soaking feet without relief. She does state she stands a lot at work and she is diabetic and newly changed from oral meds to insulin.

## 2022-07-01 NOTE — Discharge Instructions (Signed)
Recommend start Diclofenac 75mg  every 12 hours as needed with food for pain. Recommend wear supportive shoes and keep feet elevated as much as possible. Recommend call your Podiatrist ([facility]) to schedule appointment for further evaluation ASAP. Follow-up with Podiatrist ([facility]) as planned.

## 2022-07-08 ENCOUNTER — Ambulatory Visit: Payer: Medicaid Other | Admitting: Podiatry

## 2022-07-16 ENCOUNTER — Ambulatory Visit: Payer: Medicaid Other | Admitting: Podiatry

## 2022-07-27 ENCOUNTER — Ambulatory Visit: Payer: Medicaid Other | Admitting: Podiatry

## 2022-09-13 ENCOUNTER — Other Ambulatory Visit: Payer: Self-pay

## 2022-09-13 ENCOUNTER — Ambulatory Visit (HOSPITAL_COMMUNITY)
Admission: RE | Admit: 2022-09-13 | Discharge: 2022-09-13 | Disposition: A | Discharge status: Home / Self Care | Payer: Medicaid Other | Source: Ambulatory Visit | Attending: Emergency Medicine | Admitting: Emergency Medicine

## 2022-09-13 ENCOUNTER — Encounter (HOSPITAL_COMMUNITY): Payer: Self-pay

## 2022-09-13 VITALS — BP 135/91 | HR 107 | Temp 99.0°F | Resp 18

## 2022-09-13 DIAGNOSIS — N898 Other specified noninflammatory disorders of vagina: Secondary | ICD-10-CM | POA: Diagnosis not present

## 2022-09-13 DIAGNOSIS — E1165 Type 2 diabetes mellitus with hyperglycemia: Secondary | ICD-10-CM | POA: Insufficient documentation

## 2022-09-13 DIAGNOSIS — Z794 Long term (current) use of insulin: Secondary | ICD-10-CM

## 2022-09-13 DIAGNOSIS — R300 Dysuria: Secondary | ICD-10-CM | POA: Diagnosis not present

## 2022-09-13 LAB — POCT URINALYSIS DIPSTICK, ED / UC
Bilirubin Urine: NEGATIVE
Glucose, UA: >=1000 mg/dL — AB
Ketones, ur: NEGATIVE mg/dL
Leukocytes,Ua: NEGATIVE
Nitrite: NEGATIVE
Protein, ur: NEGATIVE mg/dL
Specific Gravity, Urine: <=1.005 (ref 1.005–1.030)
Urobilinogen, UA: 0.2 mg/dL (ref 0.0–1.0)
pH: 5.5 (ref 5.0–8.0)

## 2022-09-13 LAB — CBG MONITORING, ED: Glucose-Capillary: 407 mg/dL — ABNORMAL HIGH (ref 70–99)

## 2022-09-13 LAB — POC URINE PREG, ED: Preg Test, Ur: NEGATIVE

## 2022-09-13 NOTE — Discharge Instructions (Addendum)
Please call your primary care provider first thing tomorrow morning. You need to make an appointment to be seen ASAP regarding your blood sugars. Continue your insulin as prescribed. Every day!!! Uncontrolled diabetes can have a lot of bad effects on the body over time.  We will call you if anything on your swab returns positive. Please abstain from sexual intercourse until your results return.

## 2022-09-13 NOTE — ED Provider Notes (Signed)
[facility]    CSN: [id number] Arrival date & time: 09/13/22  1531      History   Chief Complaint Chief Complaint  Patient presents with   Urinary Frequency    Entered by patient   Appointment    3:30    HPI Cheryl Curry is a 37 y.o. female.  2 days ago had some dysuria but it resoled Denies fever, flank pain, urgency, frequency Also vaginal odor and itching x 2 days, also resolved. No abd pain, spotting, NVD  Also requesting CBG check today. History DM2. She is prescribed insulin but does not use it.  Last A1c in november was 14.5.  Last seen by PCP in January, was advised to return in a month for recheck. She has not returned.   Past Medical History:  Diagnosis Date   Diabetes mellitus without complication    Hypertension    Pregnancy induced hypertension     Patient Active Problem List   Diagnosis Date Noted   Essential hypertension 08/09/2019   Type 2 diabetes mellitus 08/09/2019   Encounter for insertion subdermal contraceptive 05/27/2018    Past Surgical History:  Procedure Laterality Date   NO PAST SURGERIES      OB History     Gravida  3   Para  3   Term  2   Preterm  1   AB  0   Living  3      SAB      IAB      Ectopic      Multiple  0   Live Births  3            Home Medications    Prior to Admission medications   Medication Sig Start Date End Date Taking? Authorizing Provider  acetaminophen (TYLENOL) 500 MG tablet Take 1,000 mg by mouth every 6 (six) hours as needed for mild pain.    [provider]  diclofenac (VOLTAREN) 75 MG EC tablet Take 1 tablet (75 mg total) by mouth every 12 (twelve) hours as needed for moderate pain. 07/01/22   Sudie Grumbling, NP  Etonogestrel (NEXPLANON [place]) Inject into the skin.    [provider]  glipiZIDE (GLUCOTROL) 5 MG tablet Take by mouth. 04/28/22   [provider]  insulin glargine (LANTUS) 100 UNIT/ML injection Inject into the  skin daily.    [provider]  insulin isophane & regular human KwikPen (HUMULIN 70/30 KWIKPEN) (70-30) 100 UNIT/ML KwikPen Inject into the skin. 06/02/22   [provider]  lisinopril-hydrochlorothiazide (ZESTORETIC) 20-25 MG tablet Take by mouth. 12/30/21   [provider]  insulin aspart (NOVOLOG) 100 UNIT/ML injection 30 units with breakfast; 20 units with lunch and 30 units with dinner Patient not taking: Reported on 05/27/2018 04/18/18 03/01/19  Reva Bores, MD  insulin NPH Human (HUMULIN N,NOVOLIN N) 100 UNIT/ML injection 46 units in the morning and 46 units at bedtime Patient not taking: Reported on 05/27/2018 04/18/18 03/01/19  Reva Bores, MD  labetalol (NORMODYNE) 200 MG tablet Take 3 tablets (600 mg total) by mouth 3 (three) times daily. Patient not taking: Reported on 05/27/2018 04/25/18 03/01/19  Donette Larry, CNM  medroxyPROGESTERone (PROVERA) 10 MG tablet Take 1 tablet (10 mg total) by mouth daily. 08/24/18 03/01/19  Adam Phenix, MD  NIFEdipine (PROCARDIA-XL/NIFEDICAL-XL) 30 MG 24 hr tablet Take 1 tablet (30 mg total) by mouth 2 (two) times daily. 05/27/18 03/01/19  Gerrit Heck, CNM    Family  History Family History  Problem Relation Age of Onset   Hypertension Mother    Hypertension Father     Social History Social History   Tobacco Use   Smoking status: Never   Smokeless tobacco: Never  Vaping Use   Vaping Use: Never used  Substance Use Topics   Alcohol use: Yes    Comment: occassional   Drug use: No     Allergies   Patient has no known allergies.   Review of Systems Review of Systems As per HPI  Physical Exam Triage Vital Signs ED Triage Vitals  Enc Vitals Group     BP 09/13/22 1600 (!) 135/91     Pulse Rate 09/13/22 1600 (!) 107     Resp 09/13/22 1600 18     Temp 09/13/22 1600 99 F (37.2 C)     Temp Source 09/13/22 1600 Oral     SpO2 09/13/22 1600 95 %     Weight --      Height --      Head Circumference  --      Peak Flow --      Pain Score 09/13/22 1557 7     Pain Loc --      Pain Edu? --      Excl. in GC? --    No data found.  Updated Vital Signs BP (!) 135/91 (BP Location: Left Arm)   Pulse (!) 107   Temp 99 F (37.2 C) (Oral)   Resp 18   SpO2 95%    Physical Exam Vitals and nursing note reviewed.  Constitutional:      General: She is not in acute distress.    Appearance: She is not ill-appearing or diaphoretic.  HENT:     Mouth/Throat:     Mouth: Mucous membranes are moist.     Pharynx: Oropharynx is clear.  Eyes:     Conjunctiva/sclera: Conjunctivae normal.     Pupils: Pupils are equal, round, and reactive to light.  Cardiovascular:     Rate and Rhythm: Normal rate and regular rhythm.     Heart sounds: Normal heart sounds.  Pulmonary:     Effort: Pulmonary effort is normal.     Breath sounds: Normal breath sounds.  Abdominal:     General: Bowel sounds are normal.     Palpations: Abdomen is soft.     Tenderness: There is no abdominal tenderness. There is no right CVA tenderness or left CVA tenderness.  Skin:    General: Skin is warm and dry.  Neurological:     General: No focal deficit present.     Mental Status: She is alert and oriented to person, place, and time.     Motor: No weakness.     Coordination: Coordination normal.     Gait: Gait normal.     UC Treatments / Results  Labs (all labs ordered are listed, but only abnormal results are displayed) Labs Reviewed  POCT URINALYSIS DIPSTICK, ED / UC - Abnormal; Notable for the following components:      Result Value   Glucose, UA >=1000 (*)    Hgb urine dipstick TRACE (*)    All other components within normal limits  CBG MONITORING, ED - Abnormal; Notable for the following components:   Glucose-Capillary 407 (*)    All other components within normal limits  URINE CULTURE  POC URINE PREG, ED  CERVICOVAGINAL ANCILLARY ONLY    EKG  Radiology No results found.  Procedures Procedures  (  including critical care time)  Medications Ordered in UC Medications - No data to display  Initial Impression / Assessment and Plan / UC Course  I have reviewed the triage vital signs and the nursing notes.  Pertinent labs & imaging results that were available during my care of the patient were reviewed by me and considered in my medical decision making (see chart for details).  DMT2 with hyperglycemia, uncontrolled CBG today 407 Needs close follow with PCP. Advised to call first thing tomorrow morning and make appointment. Discussed take insulin daily as prescribed. Discussed about the effects of uncontrolled diabetes on the body. Ambulatory referral to DM management placed  Dysuria Symptoms have resolved UA with glucose, trace hgb.  Will culture and defer treatment.  Vaginal itching, odor Symptoms have resolved UPT negative  Cytology swab is pending. Treat positive result if indicated   Return precautions discussed. Patient agrees to plan  Final Clinical Impressions(s) / UC Diagnoses   Final diagnoses:  Uncontrolled type 2 diabetes mellitus with hyperglycemia  Vaginal odor  Dysuria     Discharge Instructions      Please call your primary care provider first thing tomorrow morning. You need to make an appointment to be seen ASAP regarding your blood sugars. Continue your insulin as prescribed. Every day!!! Uncontrolled diabetes can have a lot of bad effects on the body over time.  We will call you if anything on your swab returns positive. Please abstain from sexual intercourse until your results return.     ED Prescriptions   None    PDMP not reviewed this encounter.   Tandy Grawe, Lurena JoinerRebecca, [place] JerseyPA-C 09/13/22 1705

## 2022-09-13 NOTE — ED Triage Notes (Addendum)
Reports symptoms started 2 days ago.  Patient reports an odor, itching,   Patient says she came about urinary symptoms, but cannot remember when started "have a whole lot going on".  Initially had burning with urination, not now.  Reports urine is cloudy.    Patient has taken advil and tylenol  Requesting cbg check

## 2022-09-14 ENCOUNTER — Telehealth (HOSPITAL_COMMUNITY): Payer: Self-pay | Admitting: Emergency Medicine

## 2022-09-14 LAB — CERVICOVAGINAL ANCILLARY ONLY
Bacterial Vaginitis (gardnerella): POSITIVE — AB
Candida Glabrata: NEGATIVE
Candida Vaginitis: POSITIVE — AB
Chlamydia: NEGATIVE
Comment: NEGATIVE
Comment: NEGATIVE
Comment: NEGATIVE
Comment: NEGATIVE
Comment: NEGATIVE
Comment: NORMAL
Neisseria Gonorrhea: NEGATIVE
Trichomonas: POSITIVE — AB

## 2022-09-14 MED ORDER — METRONIDAZOLE 500 MG PO TABS: 500.0000 mg | ORAL_TABLET | Freq: Two times a day (BID) | ORAL | 0 refills | Status: DC

## 2022-09-14 MED ORDER — FLUCONAZOLE 150 MG PO TABS: 150.0000 mg | ORAL_TABLET | Freq: Once | ORAL | 0 refills | Status: AC

## 2022-09-15 ENCOUNTER — Telehealth (HOSPITAL_COMMUNITY): Payer: Self-pay | Admitting: Emergency Medicine

## 2022-09-15 LAB — URINE CULTURE: Culture: >=100000 — AB

## 2022-09-15 MED ORDER — NITROFURANTOIN MONOHYD MACRO 100 MG PO CAPS: 100.0000 mg | ORAL_CAPSULE | Freq: Two times a day (BID) | ORAL | 0 refills | Status: DC

## 2022-12-02 ENCOUNTER — Encounter (HOSPITAL_COMMUNITY): Payer: Self-pay

## 2022-12-02 ENCOUNTER — Ambulatory Visit (HOSPITAL_COMMUNITY)
Admission: EM | Admit: 2022-12-02 | Discharge: 2022-12-02 | Disposition: A | Discharge status: Home / Self Care | Payer: Medicaid Other | Attending: Family Medicine | Admitting: Family Medicine

## 2022-12-02 DIAGNOSIS — M778 Other enthesopathies, not elsewhere classified: Secondary | ICD-10-CM

## 2022-12-02 DIAGNOSIS — I10 Essential (primary) hypertension: Secondary | ICD-10-CM

## 2022-12-02 MED ORDER — NAPROXEN 375 MG PO TABS: 375.0000 mg | ORAL_TABLET | Freq: Two times a day (BID) | ORAL | 0 refills | Status: DC

## 2022-12-02 MED ORDER — CYCLOBENZAPRINE HCL 10 MG PO TABS: 10.0000 mg | ORAL_TABLET | Freq: Two times a day (BID) | ORAL | 0 refills | Status: DC | PRN

## 2022-12-02 NOTE — ED Triage Notes (Signed)
Pt c/o high blood pressure with pain to lt arm since yesterday. States non complaint with b/p meds, may take them every other day but did this am.

## 2022-12-02 NOTE — Discharge Instructions (Addendum)
Restart blood pressure medication lisinopril-hydrochlorothiazide as prescribed and continue to monitor blood pressure at home.  If you develop any shortness of breath, chest pain, weakness or severe headache pain go immediately to the emergency department.  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  Start naproxen take twice daily over the next few days and apply heat to shoulder.  I also prescribed you cyclobenzaprine medication can cause drowsiness and only take when you are not getting or needing to drive.

## 2022-12-02 NOTE — ED Provider Notes (Signed)
[facility]    CSN: [id number] Arrival date & time: 12/02/22  1843      History   Chief Complaint Chief Complaint  Patient presents with   Hypertension    HPI Shonica Weier is a 37 y.o. female.   HPI Patient with a tree of type 2 diabetes last A1c was 14.5%, hypertension presents today for evaluation of days of elevated blood pressure readings at home and left shoulder pain.  Past Medical History:  Diagnosis Date   Diabetes mellitus without complication (HCC)    Hypertension    Pregnancy induced hypertension     Patient Active Problem List   Diagnosis Date Noted   Essential hypertension 08/09/2019   Type 2 diabetes mellitus (HCC) 08/09/2019   Encounter for insertion subdermal contraceptive 05/27/2018    Past Surgical History:  Procedure Laterality Date   NO PAST SURGERIES      OB History     Gravida  3   Para  3   Term  2   Preterm  1   AB  0   Living  3      SAB      IAB      Ectopic      Multiple  0   Live Births  3            Home Medications    Prior to Admission medications   Medication Sig Start Date End Date Taking? Authorizing Provider  acetaminophen (TYLENOL) 500 MG tablet Take 1,000 mg by mouth every 6 (six) hours as needed for mild pain.    [provider]  diclofenac (VOLTAREN) 75 MG EC tablet Take 1 tablet (75 mg total) by mouth every 12 (twelve) hours as needed for moderate pain. 07/01/22   Sudie Grumbling, NP  Etonogestrel (NEXPLANON [place]) Inject into the skin.    [provider]  glipiZIDE (GLUCOTROL) 5 MG tablet Take by mouth. 04/28/22   [provider]  insulin glargine (LANTUS) 100 UNIT/ML injection Inject into the skin daily.    [provider]  insulin isophane & regular human KwikPen (HUMULIN 70/30 KWIKPEN) (70-30) 100 UNIT/ML KwikPen Inject into the skin. 06/02/22   [provider]  lisinopril-hydrochlorothiazide (ZESTORETIC) 20-25 MG tablet Take  by mouth. 12/30/21   [provider]  insulin aspart (NOVOLOG) 100 UNIT/ML injection 30 units with breakfast; 20 units with lunch and 30 units with dinner Patient not taking: Reported on 05/27/2018 04/18/18 03/01/19  Reva Bores, MD  insulin NPH Human (HUMULIN N,NOVOLIN N) 100 UNIT/ML injection 46 units in the morning and 46 units at bedtime Patient not taking: Reported on 05/27/2018 04/18/18 03/01/19  Reva Bores, MD  labetalol (NORMODYNE) 200 MG tablet Take 3 tablets (600 mg total) by mouth 3 (three) times daily. Patient not taking: Reported on 05/27/2018 04/25/18 03/01/19  Donette Larry, CNM  medroxyPROGESTERone (PROVERA) 10 MG tablet Take 1 tablet (10 mg total) by mouth daily. 08/24/18 03/01/19  Adam Phenix, MD  NIFEdipine (PROCARDIA-XL/NIFEDICAL-XL) 30 MG 24 hr tablet Take 1 tablet (30 mg total) by mouth 2 (two) times daily. 05/27/18 03/01/19  Gerrit Heck, CNM    Family History Family History  Problem Relation Age of Onset   Hypertension Mother    Hypertension Father     Social History Social History   Tobacco Use   Smoking status: Never   Smokeless tobacco: Never  Vaping Use   Vaping Use: Never used  Substance Use Topics  Alcohol use: Yes    Comment: occassional   Drug use: No     Allergies   Patient has no known allergies.   Review of Systems Review of Systems   Physical Exam Triage Vital Signs ED Triage Vitals [12/02/22 1906]  Enc Vitals Group     BP (!) 186/120     Pulse Rate 92     Resp 18     Temp 99 F (37.2 C)     Temp Source Oral     SpO2 98 %     Weight      Height      Head Circumference      Peak Flow      Pain Score 6     Pain Loc      Pain Edu?      Excl. in GC?    No data found.  Updated Vital Signs BP (!) 186/120 (BP Location: Right Arm)   Pulse 92   Temp 99 F (37.2 C) (Oral)   Resp 18   SpO2 98%   Visual Acuity Right Eye Distance:   Left Eye Distance:   Bilateral Distance:    Right Eye Near:   Left  Eye Near:    Bilateral Near:     Physical Exam   UC Treatments / Results  Labs (all labs ordered are listed, but only abnormal results are displayed) Labs Reviewed - No data to display  EKG   Radiology No results found.  Procedures Procedures (including critical care time)  Medications Ordered in UC Medications - No data to display  Initial Impression / Assessment and Plan / UC Course  I have reviewed the triage vital signs and the nursing notes.  Pertinent labs & imaging results that were available during my care of the patient were reviewed by me and considered in my medical decision making (see chart for details).     *** Final Clinical Impressions(s) / UC Diagnoses   Final diagnoses:  None   Discharge Instructions   None    ED Prescriptions   None    PDMP not reviewed this encounter.

## 2023-02-10 ENCOUNTER — Ambulatory Visit: Payer: Medicaid Other | Admitting: Podiatry

## 2023-02-17 ENCOUNTER — Ambulatory Visit (INDEPENDENT_AMBULATORY_CARE_PROVIDER_SITE_OTHER): Payer: Medicaid Other | Admitting: Podiatry

## 2023-02-17 ENCOUNTER — Ambulatory Visit: Payer: Medicaid Other | Admitting: Podiatry

## 2023-02-17 DIAGNOSIS — E1165 Type 2 diabetes mellitus with hyperglycemia: Secondary | ICD-10-CM

## 2023-02-17 DIAGNOSIS — Z794 Long term (current) use of insulin: Secondary | ICD-10-CM

## 2023-02-17 NOTE — Progress Notes (Signed)
This patient presents to the office with chief complaint of long thick big toe nails and diabetic feet.  This patient  says there  is  burning in her feet.  This patient says there are long thick painful big toenails  nails.  These nails are painful walking and wearing shoes.  Patient has no history of infection or drainage from both feet.  Patient is unable to  self treat her  own nails . This patient presents  to the office today for treatment of the  long nails and a foot evaluation due to history of  diabetes.  General Appearance  Alert, conversant and in no acute stress.  Vascular  Dorsalis pedis and posterior tibial  pulses are palpable  bilaterally.  Capillary return is within normal limits  bilaterally. Temperature is within normal limits  bilaterally. Hair presents on digits.  Neurologic  Senn-Weinstein monofilament wire test within normal limits  bilaterally. Muscle power within normal limits bilaterally.  Nails Thick disfigured discolored nails with subungual debris  hallux nails bilaterally. No evidence of bacterial infection or drainage bilaterally.  Orthopedic  No limitations of motion of motion feet .  No crepitus or effusions noted.  No bony pathology or digital deformities noted.  Skin  normotropic skin with no porokeratosis noted bilaterally.  No signs of infections or ulcers noted.     Onychomycosis  Diabetes with no foot complications  IE  Debride nails x 10.  A diabetic foot exam was performed and there is no evidence of any vascular or neurologic pathology.   RTC 3 months.   Helane Gunther DPM

## 2023-03-15 ENCOUNTER — Ambulatory Visit (HOSPITAL_COMMUNITY): Payer: Self-pay

## 2023-03-22 ENCOUNTER — Ambulatory Visit (HOSPITAL_COMMUNITY): Payer: Self-pay

## 2023-05-19 ENCOUNTER — Ambulatory Visit: Payer: MEDICAID | Admitting: Podiatry

## 2023-05-21 ENCOUNTER — Ambulatory Visit: Payer: MEDICAID | Admitting: Podiatry

## 2023-07-22 ENCOUNTER — Ambulatory Visit: Payer: MEDICAID | Admitting: Podiatry

## 2023-07-23 ENCOUNTER — Encounter: Payer: Self-pay | Admitting: Podiatry

## 2023-07-23 ENCOUNTER — Ambulatory Visit: Payer: MEDICAID | Admitting: Podiatry

## 2023-07-23 VITALS — Ht 67.0 in | Wt 197.7 lb

## 2023-07-23 DIAGNOSIS — M216X2 Other acquired deformities of left foot: Secondary | ICD-10-CM

## 2023-07-23 DIAGNOSIS — M216X1 Other acquired deformities of right foot: Secondary | ICD-10-CM

## 2023-07-23 NOTE — Progress Notes (Signed)
  Subjective:  Patient ID: Cheryl Curry, female    DOB: 1985-08-26,  MRN: [id number]  Chief Complaint  Patient presents with   Foot Pain    Pt is here for bilateral foot pain she states that is a diabetic and was put on gabapentin and it does nothing for the pain, pain has been there for a few months, no injury to foot.    38 y.o. female presents with the above complaint.  Patient presents with complaint of bilateral arch pain that has been on for quite some time is bearable he wants to discuss treatment options she states that no injury to the foot.  Been going on for few months denies any other acute complaint   Review of Systems: Negative except as noted in the HPI. Denies N/V/F/Ch.  Past Medical History:  Diagnosis Date   Diabetes mellitus without complication (HCC)    Hypertension    Pregnancy induced hypertension     Current Outpatient Medications:    cyclobenzaprine (FLEXERIL) 10 MG tablet, Take 1 tablet (10 mg total) by mouth 2 (two) times daily as needed for muscle spasms., Disp: 20 tablet, Rfl: 0   Etonogestrel (NEXPLANON [place]), Inject into the skin., Disp: , Rfl:    glipiZIDE (GLUCOTROL) 5 MG tablet, Take by mouth., Disp: , Rfl:    insulin glargine (LANTUS) 100 UNIT/ML injection, Inject into the skin daily., Disp: , Rfl:    insulin isophane & regular human KwikPen (HUMULIN 70/30 KWIKPEN) (70-30) 100 UNIT/ML KwikPen, Inject into the skin., Disp: , Rfl:    lisinopril-hydrochlorothiazide (ZESTORETIC) 20-25 MG tablet, Take by mouth., Disp: , Rfl:    naproxen (NAPROSYN) 375 MG tablet, Take 1 tablet (375 mg total) by mouth 2 (two) times daily., Disp: 20 tablet, Rfl: 0  Social History   Tobacco Use  Smoking Status Never  Smokeless Tobacco Never    No Known Allergies Objective:  There were no vitals filed for this visit. Body mass index is 30.96 kg/m. Constitutional Well developed. Well nourished.  Vascular Dorsalis pedis pulses palpable bilaterally. Posterior  tibial pulses palpable bilaterally. Capillary refill normal to all digits.  No cyanosis or clubbing noted. Pedal hair growth normal.  Neurologic Normal speech. Oriented to person, place, and time. Epicritic sensation to light touch grossly present bilaterally.  Dermatologic Nails well groomed and normal in appearance. No open wounds. No skin lesions.  Orthopedic: Bilateral arch pain with pes planovalgus foot deformity calcaneovalgus to many toe signs partially but recurred the arch with dorsiflexion of the hallux unable to perform single and double heel raise   Radiographs: None Assessment:   1. Other acquired deformities of left foot   2. Other acquired deformities of right foot    Plan:  Patient was evaluated and treated and all questions answered.  Pes planovalgus -I explained to patient the etiology of pes planovalgus and relationship with Planter fasciitis and various treatment options were discussed.  Given patient foot structure in the setting of Planter fasciitis I believe patient will benefit from custom-made orthotics to help control the hindfoot motion support the arch of the foot and take the stress away from plantar fascial.  Patient agrees with the plan like to proceed with orthotics -I discussed with the patient about custom orthotics but will likely do over-the-counter power steps she states she will obtain them and will make shoe gear modification   No follow-ups on file.

## 2023-07-30 NOTE — Addendum Note (Signed)
Addended by: Nicholes Rough on: 07/30/2023 08:57 AM   Modules accepted: Level of Service

## 2023-08-12 ENCOUNTER — Ambulatory Visit (HOSPITAL_COMMUNITY): Payer: Self-pay

## 2023-08-14 ENCOUNTER — Emergency Department (HOSPITAL_COMMUNITY): Payer: MEDICAID

## 2023-08-14 ENCOUNTER — Encounter (HOSPITAL_COMMUNITY): Payer: Self-pay

## 2023-08-14 ENCOUNTER — Emergency Department (HOSPITAL_COMMUNITY)
Admission: EM | Admit: 2023-08-14 | Discharge: 2023-08-14 | Disposition: A | Discharge status: Home / Self Care | Payer: MEDICAID | Attending: Emergency Medicine | Admitting: Emergency Medicine

## 2023-08-14 ENCOUNTER — Other Ambulatory Visit: Payer: Self-pay

## 2023-08-14 ENCOUNTER — Ambulatory Visit (INDEPENDENT_AMBULATORY_CARE_PROVIDER_SITE_OTHER)
Admission: RE | Admit: 2023-08-14 | Discharge: 2023-08-14 | Disposition: A | Discharge status: Home / Self Care | Payer: MEDICAID | Source: Ambulatory Visit

## 2023-08-14 VITALS — BP 144/95 | HR 112 | Temp 98.7°F | Resp 18

## 2023-08-14 DIAGNOSIS — Z794 Long term (current) use of insulin: Secondary | ICD-10-CM | POA: Diagnosis not present

## 2023-08-14 DIAGNOSIS — J90 Pleural effusion, not elsewhere classified: Secondary | ICD-10-CM | POA: Insufficient documentation

## 2023-08-14 DIAGNOSIS — R739 Hyperglycemia, unspecified: Secondary | ICD-10-CM | POA: Diagnosis not present

## 2023-08-14 DIAGNOSIS — J811 Chronic pulmonary edema: Secondary | ICD-10-CM | POA: Diagnosis not present

## 2023-08-14 DIAGNOSIS — R791 Abnormal coagulation profile: Secondary | ICD-10-CM | POA: Diagnosis not present

## 2023-08-14 DIAGNOSIS — Z113 Encounter for screening for infections with a predominantly sexual mode of transmission: Secondary | ICD-10-CM | POA: Diagnosis not present

## 2023-08-14 DIAGNOSIS — Z8249 Family history of ischemic heart disease and other diseases of the circulatory system: Secondary | ICD-10-CM | POA: Insufficient documentation

## 2023-08-14 DIAGNOSIS — I119 Hypertensive heart disease without heart failure: Secondary | ICD-10-CM | POA: Diagnosis not present

## 2023-08-14 DIAGNOSIS — Z7984 Long term (current) use of oral hypoglycemic drugs: Secondary | ICD-10-CM | POA: Insufficient documentation

## 2023-08-14 DIAGNOSIS — R000 Tachycardia, unspecified: Secondary | ICD-10-CM | POA: Diagnosis not present

## 2023-08-14 DIAGNOSIS — R0602 Shortness of breath: Secondary | ICD-10-CM | POA: Diagnosis present

## 2023-08-14 DIAGNOSIS — Z7985 Long-term (current) use of injectable non-insulin antidiabetic drugs: Secondary | ICD-10-CM | POA: Diagnosis not present

## 2023-08-14 DIAGNOSIS — N390 Urinary tract infection, site not specified: Secondary | ICD-10-CM | POA: Diagnosis present

## 2023-08-14 DIAGNOSIS — R109 Unspecified abdominal pain: Secondary | ICD-10-CM | POA: Diagnosis present

## 2023-08-14 DIAGNOSIS — I10 Essential (primary) hypertension: Secondary | ICD-10-CM | POA: Insufficient documentation

## 2023-08-14 DIAGNOSIS — E1165 Type 2 diabetes mellitus with hyperglycemia: Secondary | ICD-10-CM | POA: Diagnosis not present

## 2023-08-14 DIAGNOSIS — E119 Type 2 diabetes mellitus without complications: Secondary | ICD-10-CM | POA: Insufficient documentation

## 2023-08-14 LAB — CBC
HCT: 38.8 % (ref 36.0–46.0)
Hemoglobin: 13 g/dL (ref 12.0–15.0)
MCH: 31.4 pg (ref 26.0–34.0)
MCHC: 33.5 g/dL (ref 30.0–36.0)
MCV: 93.7 fL (ref 80.0–100.0)
Platelets: 210 10*3/uL (ref 150–400)
RBC: 4.14 MIL/uL (ref 3.87–5.11)
RDW: 12.4 % (ref 11.5–15.5)
WBC: 6.1 10*3/uL (ref 4.0–10.5)
nRBC: 0 % (ref 0.0–0.2)

## 2023-08-14 LAB — URINALYSIS, ROUTINE W REFLEX MICROSCOPIC
Bilirubin Urine: NEGATIVE
Glucose, UA: NEGATIVE mg/dL
Hgb urine dipstick: NEGATIVE
Ketones, ur: NEGATIVE mg/dL
Leukocytes,Ua: NEGATIVE
Nitrite: NEGATIVE
Protein, ur: NEGATIVE mg/dL
Specific Gravity, Urine: 1.026 (ref 1.005–1.030)
pH: 7 (ref 5.0–8.0)

## 2023-08-14 LAB — COMPREHENSIVE METABOLIC PANEL
ALT: 19 U/L (ref 0–44)
AST: 24 U/L (ref 15–41)
Albumin: 3.6 g/dL (ref 3.5–5.0)
Alkaline Phosphatase: 40 U/L (ref 38–126)
Anion gap: 9 (ref 5–15)
BUN: 14 mg/dL (ref 6–20)
CO2: 21 mmol/L — ABNORMAL LOW (ref 22–32)
Calcium: 8.9 mg/dL (ref 8.9–10.3)
Chloride: 104 mmol/L (ref 98–111)
Creatinine, Ser: 0.88 mg/dL (ref 0.44–1.00)
GFR, Estimated: >60 mL/min (ref >60)
Glucose, Bld: 246 mg/dL — ABNORMAL HIGH (ref 70–99)
Potassium: 3.7 mmol/L (ref 3.5–5.1)
Sodium: 134 mmol/L — ABNORMAL LOW (ref 135–145)
Total Bilirubin: 1.2 mg/dL (ref 0.0–1.2)
Total Protein: 6.2 g/dL — ABNORMAL LOW (ref 6.5–8.1)

## 2023-08-14 LAB — POCT URINALYSIS DIP (MANUAL ENTRY)
Bilirubin, UA: NEGATIVE
Glucose, UA: >=1000 mg/dL — AB
Ketones, POC UA: NEGATIVE mg/dL
Nitrite, UA: NEGATIVE
Protein Ur, POC: >=300 mg/dL — AB
Spec Grav, UA: >=1.03 — AB (ref 1.010–1.025)
Urobilinogen, UA: 1 U/dL
pH, UA: 6 (ref 5.0–8.0)

## 2023-08-14 LAB — TROPONIN I (HIGH SENSITIVITY)
Troponin I (High Sensitivity): 14 ng/L (ref <18)
Troponin I (High Sensitivity): 17 ng/L (ref <18)

## 2023-08-14 LAB — RESP PANEL BY RT-PCR (RSV, FLU A&B, COVID)  RVPGX2
Influenza A by PCR: NEGATIVE
Influenza B by PCR: NEGATIVE
Resp Syncytial Virus by PCR: NEGATIVE
SARS Coronavirus 2 by RT PCR: NEGATIVE

## 2023-08-14 LAB — HCG, SERUM, QUALITATIVE: Preg, Serum: NEGATIVE

## 2023-08-14 LAB — POCT FASTING CBG KUC MANUAL ENTRY: POCT Glucose (KUC): 257 mg/dL — AB (ref 70–99)

## 2023-08-14 LAB — I-STAT CG4 LACTIC ACID, ED: Lactic Acid, Venous: 0.9 mmol/L (ref 0.5–1.9)

## 2023-08-14 LAB — D-DIMER, QUANTITATIVE: D-Dimer, Quant: 0.75 ug{FEU}/mL — ABNORMAL HIGH (ref 0.00–0.50)

## 2023-08-14 MED ORDER — IOHEXOL 350 MG/ML SOLN
75.0000 mL | Freq: Once | INTRAVENOUS | Status: AC | PRN
Start: 2023-08-14 — End: 2023-08-14
  Administered 2023-08-14: 75 mL via INTRAVENOUS

## 2023-08-14 MED ORDER — SODIUM CHLORIDE 0.9 % IV BOLUS
1000.0000 mL | Freq: Once | INTRAVENOUS | Status: AC
  Administered 2023-08-14: 1000 mL via INTRAVENOUS

## 2023-08-14 MED ORDER — SODIUM CHLORIDE 0.9 % IV BOLUS
500.0000 mL | Freq: Once | INTRAVENOUS | Status: AC
  Administered 2023-08-14: 500 mL via INTRAVENOUS

## 2023-08-14 NOTE — ED Provider Notes (Signed)
 [facility]    CSN: [id number] Arrival date & time: 08/14/23  1240      History   Chief Complaint Chief Complaint  Patient presents with   Blood Sugar Problem    Std checkBreathing Right foot swollenUTI Hot flashes Chills Dizziness - Entered by patient   Appointment    12:30   Dysuria    HPI Cheryl Curry is a 38 y.o. female.   Patient presents with dysuria, frequent urination, abdominal pain that started about 2 days ago.  She also reports her breathing feels fast mostly at night.  States she sometimes feels like she cannot catch her breath.  Denies congestion, cough, fever, chills.  She has poorly controlled diabetes.  Has not taken diabetes medications today.  She has not followed with a primary care over the last year, A1c has come down from 16-10.4 in November.    Past Medical History:  Diagnosis Date   Diabetes mellitus without complication (HCC)    Hypertension    Pregnancy induced hypertension     Patient Active Problem List   Diagnosis Date Noted   Essential hypertension 08/09/2019   Type 2 diabetes mellitus (HCC) 08/09/2019   Encounter for insertion subdermal contraceptive 05/27/2018    Past Surgical History:  Procedure Laterality Date   NO PAST SURGERIES      OB History     Gravida  3   Para  3   Term  2   Preterm  1   AB  0   Living  3      SAB      IAB      Ectopic      Multiple  0   Live Births  3            Home Medications    Prior to Admission medications   Medication Sig Start Date End Date Taking? Authorizing Provider  Semaglutide, 1 MG/DOSE, (OZEMPIC, 1 MG/DOSE,) 4 MG/3ML SOPN Inject 1 mg into the skin once a week. 07/08/23  Yes [provider]  cyclobenzaprine (FLEXERIL) 10 MG tablet Take 1 tablet (10 mg total) by mouth 2 (two) times daily as needed for muscle spasms. 12/02/22   Bing Neighbors, NP  Etonogestrel [facility]) Inject into the skin.    [provider]   glipiZIDE (GLUCOTROL) 5 MG tablet Take by mouth. 04/28/22   [provider]  insulin glargine (LANTUS) 100 UNIT/ML injection Inject into the skin daily.    [provider]  insulin isophane & regular human KwikPen (HUMULIN 70/30 KWIKPEN) (70-30) 100 UNIT/ML KwikPen Inject into the skin. 06/02/22   [provider]  lisinopril-hydrochlorothiazide (ZESTORETIC) 20-25 MG tablet Take by mouth. 12/30/21   [provider]  naproxen (NAPROSYN) 375 MG tablet Take 1 tablet (375 mg total) by mouth 2 (two) times daily. 12/02/22   Bing Neighbors, NP  insulin aspart (NOVOLOG) 100 UNIT/ML injection 30 units with breakfast; 20 units with lunch and 30 units with dinner Patient not taking: Reported on 05/27/2018 04/18/18 03/01/19  Reva Bores, MD  insulin NPH Human (HUMULIN N,NOVOLIN N) 100 UNIT/ML injection 46 units in the morning and 46 units at bedtime Patient not taking: Reported on 05/27/2018 04/18/18 03/01/19  Reva Bores, MD  labetalol (NORMODYNE) 200 MG tablet Take 3 tablets (600 mg total) by mouth 3 (three) times daily. Patient not taking: Reported on 05/27/2018 04/25/18 03/01/19  Donette Larry, CNM  medroxyPROGESTERone (PROVERA) 10 MG tablet Take 1 tablet (  10 mg total) by mouth daily. 08/24/18 03/01/19  Adam Phenix, MD  NIFEdipine (PROCARDIA-XL/NIFEDICAL-XL) 30 MG 24 hr tablet Take 1 tablet (30 mg total) by mouth 2 (two) times daily. 05/27/18 03/01/19  Gerrit Heck, CNM    Family History Family History  Problem Relation Age of Onset   Hypertension Mother    Hypertension Father     Social History Social History   Tobacco Use   Smoking status: Never   Smokeless tobacco: Never  Vaping Use   Vaping status: Never Used  Substance Use Topics   Alcohol use: Yes    Comment: occassional   Drug use: No     Allergies   Patient has no known allergies.   Review of Systems Review of Systems  Constitutional:  Negative for chills and fever.  HENT:   Negative for ear pain and sore throat.   Eyes:  Negative for pain and visual disturbance.  Respiratory:  Positive for shortness of breath. Negative for cough.   Cardiovascular:  Negative for chest pain and palpitations.  Gastrointestinal:  Positive for abdominal pain. Negative for vomiting.  Genitourinary:  Positive for dysuria and frequency. Negative for hematuria.  Musculoskeletal:  Negative for arthralgias and back pain.  Skin:  Negative for color change and rash.  Neurological:  Negative for seizures and syncope.  All other systems reviewed and are negative.    Physical Exam Triage Vital Signs ED Triage Vitals  Encounter Vitals Group     BP 08/14/23 1300 (!) 144/95     Systolic BP Percentile --      Diastolic BP Percentile --      Pulse Rate 08/14/23 1300 (!) 112     Resp 08/14/23 1300 18     Temp 08/14/23 1300 98.7 F (37.1 C)     Temp Source 08/14/23 1300 Oral     SpO2 08/14/23 1300 100 %     Weight --      Height --      Head Circumference --      Peak Flow --      Pain Score 08/14/23 1257 0     Pain Loc --      Pain Education --      Exclude from Growth Chart --    No data found.  Updated Vital Signs BP (!) 144/95 (BP Location: Left Arm) Comment (BP Location): large cuff  Pulse (!) 112   Temp 98.7 F (37.1 C) (Oral)   Resp 18   SpO2 100%   Visual Acuity Right Eye Distance:   Left Eye Distance:   Bilateral Distance:    Right Eye Near:   Left Eye Near:    Bilateral Near:     Physical Exam   UC Treatments / Results  Labs (all labs ordered are listed, but only abnormal results are displayed) Labs Reviewed  POCT URINALYSIS DIP (MANUAL ENTRY) - Abnormal; Notable for the following components:      Result Value   Glucose, UA >=1,000 (*)    Spec Grav, UA >=1.030 (*)    Blood, UA trace-intact (*)    Protein Ur, POC >=300 (*)    Leukocytes, UA Small (1+) (*)    All other components within normal limits  POCT FASTING CBG KUC MANUAL ENTRY - Abnormal;  Notable for the following components:   POCT Glucose (K[facility]) 257 (*)    All other components within normal limits  CERVICOVAGINAL ANCILLARY ONLY    EKG   Radiology No results  found.  Procedures Procedures (including critical care time)  Medications Ordered in UC Medications - No data to display  Initial Impression / Assessment and Plan / UC Course  I have reviewed the triage vital signs and the nursing notes.  Pertinent labs & imaging results that were available during my care of the patient were reviewed by me and considered in my medical decision making (see chart for details).     PT with abdominal pain, tachycardic, shortness of breath at times and greater than 1000 glucose and protein on urine.  Poorly controlled diabetes.  Recommend further evaluation in the Emergency Dept.  Final Clinical Impressions(s) / UC Diagnoses   Final diagnoses:  Hyperglycemia     Discharge Instructions      Recommend further evaluation in the Emergency Department     ED Prescriptions   None    PDMP not reviewed this encounter.   Ward, Tylene Fantasia, PA-C 08/14/23 1520

## 2023-08-14 NOTE — ED Triage Notes (Signed)
 Patient concerned for uti.  Onset 3 -4 days ago.  Reports burning after urination.  Denies back and or abdominal pain.  2 days ago noticed an increase in urinary frequency.    Denies vaginal discharge  Reports over the past 2 days feels she is breathing too fast.  Reports she wakes up at night feeling she can't catch her breath.  Patient reports a non productive cough.  Denies fever.  Reports she has sniffles.    Denies taking any medications for her symptoms

## 2023-08-14 NOTE — ED Provider Triage Note (Addendum)
 Emergency Medicine Provider Triage Evaluation Note  Jamel Dunton , a 38 y.o. female  was evaluated in triage.  Pt complains of SOB. SOB for the past 5-7 days with intermittent orthopnea. She also has been having dysuria. Went to UC and they sent her here for proteinuria and tachycardia.  Requesting STD testing as well.  Review of Systems  Positive: SOB, nausea Negative: CP, fever, vomiting  Physical Exam  BP (!) 150/108 (BP Location: Right Arm)   Pulse (!) 59   Temp 98 F (36.7 C)   Resp 18   Ht 5\' 7"  (1.702 m)   Wt 89.7 kg   SpO2 100%   BMI 30.97 kg/m  Gen:   Awake, no distress   Resp:  Normal effort  MSK:   Moves extremities without difficulty  Other:    Medical Decision Making  Medically screening exam initiated at 3:30 PM.  Appropriate orders placed.  Dellanira Miller-Pinchback was informed that the remainder of the evaluation will be completed by another provider, this initial triage assessment does not replace that evaluation, and the importance of remaining in the ED until their evaluation is complete.   Maxwell Marion, PA-C 08/14/23 1532    Maxwell Marion, PA-C 08/14/23 1535

## 2023-08-14 NOTE — ED Provider Notes (Signed)
 [place] EMERGENCY DEPARTMENT AT [facility] Provider Note   CSN: [id number] Arrival date & time: 08/14/23  1511     History  Chief Complaint  Patient presents with   Tachycardia    Cheryl Curry is a 38 y.o. female with a history of hypertension and type 2 diabetes mellitus who presents the ED today for shortness of breath.  Patient reports feeling shortness of breath for the past 4 days as well as having some dysuria.  She went to urgent care earlier today and was advised to come to the ED for further evaluation due to having elevated heart rate of 112 bpm.  Denies any feelings of palpitations or chest pains.  No fevers, nausea, vomiting, or abdominal pain.  Patient is requesting STD testing well at this time.  No additional complaints or concerns.    Home Medications Prior to Admission medications   Medication Sig Start Date End Date Taking? Authorizing Provider  cyclobenzaprine (FLEXERIL) 10 MG tablet Take 1 tablet (10 mg total) by mouth 2 (two) times daily as needed for muscle spasms. 12/02/22   Bing Neighbors, NP  Etonogestrel [facility]) Inject into the skin.    [provider]  glipiZIDE (GLUCOTROL) 5 MG tablet Take by mouth. 04/28/22   [provider]  insulin glargine (LANTUS) 100 UNIT/ML injection Inject into the skin daily.    [provider]  insulin isophane & regular human KwikPen (HUMULIN 70/30 KWIKPEN) (70-30) 100 UNIT/ML KwikPen Inject into the skin. 06/02/22   [provider]  lisinopril-hydrochlorothiazide (ZESTORETIC) 20-25 MG tablet Take by mouth. 12/30/21   [provider]  naproxen (NAPROSYN) 375 MG tablet Take 1 tablet (375 mg total) by mouth 2 (two) times daily. 12/02/22   Bing Neighbors, NP  Semaglutide, 1 MG/DOSE, (OZEMPIC, 1 MG/DOSE,) 4 MG/3ML SOPN Inject 1 mg into the skin once a week. 07/08/23   [provider]  insulin aspart (NOVOLOG) 100 UNIT/ML injection 30 units with  breakfast; 20 units with lunch and 30 units with dinner Patient not taking: Reported on 05/27/2018 04/18/18 03/01/19  Reva Bores, MD  insulin NPH Human (HUMULIN N,NOVOLIN N) 100 UNIT/ML injection 46 units in the morning and 46 units at bedtime Patient not taking: Reported on 05/27/2018 04/18/18 03/01/19  Reva Bores, MD  labetalol (NORMODYNE) 200 MG tablet Take 3 tablets (600 mg total) by mouth 3 (three) times daily. Patient not taking: Reported on 05/27/2018 04/25/18 03/01/19  Donette Larry, CNM  medroxyPROGESTERone (PROVERA) 10 MG tablet Take 1 tablet (10 mg total) by mouth daily. 08/24/18 03/01/19  Adam Phenix, MD  NIFEdipine (PROCARDIA-XL/NIFEDICAL-XL) 30 MG 24 hr tablet Take 1 tablet (30 mg total) by mouth 2 (two) times daily. 05/27/18 03/01/19  Gerrit Heck, CNM      Allergies    Patient has no known allergies.    Review of Systems   Review of Systems  Respiratory:  Positive for shortness of breath.   All other systems reviewed and are negative.   Physical Exam Updated Vital Signs BP (!) 150/116   Pulse (!) 115   Temp 98.4 F (36.9 C) (Oral)   Resp (!) 27   Ht 5\' 7"  (1.702 m)   Wt 89.7 kg   SpO2 96%   BMI 30.97 kg/m  Physical Exam Vitals and nursing note reviewed.  Constitutional:      General: She is not in acute distress.    Appearance: Normal appearance.  HENT:  Head: Normocephalic and atraumatic.     Mouth/Throat:     Mouth: Mucous membranes are moist.  Eyes:     Conjunctiva/sclera: Conjunctivae normal.     Pupils: Pupils are equal, round, and reactive to light.  Cardiovascular:     Rate and Rhythm: Regular rhythm. Tachycardia present.     Pulses: Normal pulses.     Heart sounds: Normal heart sounds.  Pulmonary:     Effort: Pulmonary effort is normal.     Breath sounds: Normal breath sounds.  Abdominal:     Palpations: Abdomen is soft.     Tenderness: There is no abdominal tenderness.  Musculoskeletal:        General: Normal range of motion.   Skin:    General: Skin is warm and dry.     Findings: No rash.  Neurological:     General: No focal deficit present.     Mental Status: She is alert.  Psychiatric:        Mood and Affect: Mood normal.        Behavior: Behavior normal.    ED Results / Procedures / Treatments   Labs (all labs ordered are listed, but only abnormal results are displayed) Labs Reviewed  D-DIMER, QUANTITATIVE - Abnormal; Notable for the following components:      Result Value   D-Dimer, Quant 0.75 (*)    All other components within normal limits  COMPREHENSIVE METABOLIC PANEL - Abnormal; Notable for the following components:   Sodium 134 (*)    CO2 21 (*)    Glucose, Bld 246 (*)    Total Protein 6.2 (*)    All other components within normal limits  RESP PANEL BY RT-PCR (RSV, FLU A&B, COVID)  RVPGX2  CULTURE, BLOOD (ROUTINE X 2)  CULTURE, BLOOD (ROUTINE X 2)  CBC  HCG, SERUM, QUALITATIVE  URINALYSIS, ROUTINE W REFLEX MICROSCOPIC  I-STAT CG4 LACTIC ACID, ED  I-STAT CG4 LACTIC ACID, ED  GC/CHLAMYDIA PROBE AMP ([place]) NOT AT [facility]  TROPONIN I (HIGH SENSITIVITY)  TROPONIN I (HIGH SENSITIVITY)    EKG EKG Interpretation Date/Time:  Saturday August 14 2023 15:20:46 EST Ventricular Rate:  120 PR Interval:  142 QRS Duration:  72 QT Interval:  320 QTC Calculation: 452 R Axis:   -88  Text Interpretation: Sinus tachycardia Possible Left atrial enlargement Left axis deviation Low voltage QRS Cannot rule out Anterior infarct , age undetermined Abnormal ECG When compared with ECG of 02-Dec-2022 19:13, PREVIOUS ECG IS PRESENT New since previous tracing Confirmed by Vanetta Mulders [phone]) on 08/14/2023 4:21:18 PM  Radiology CT Angio Chest PE W and/or Wo Contrast Result Date: 08/14/2023 CLINICAL DATA:  Suspected pulmonary embolism. EXAM: CT ANGIOGRAPHY CHEST WITH CONTRAST TECHNIQUE: Multidetector CT imaging of the chest was performed using the standard protocol during bolus administration of  intravenous contrast. Multiplanar CT image reconstructions and MIPs were obtained to evaluate the vascular anatomy. RADIATION DOSE REDUCTION: This exam was performed according to the departmental dose-optimization program which includes automated exposure control, adjustment of the mA and/or kV according to patient size and/or use of iterative reconstruction technique. CONTRAST:  75mL OMNIPAQUE IOHEXOL 350 MG/ML SOLN COMPARISON:  None Available. FINDINGS: Cardiovascular: Satisfactory opacification of the pulmonary arteries to the segmental level. No evidence of pulmonary embolism. Normal heart size. No pericardial effusion. Mediastinum/Nodes: No enlarged mediastinal, hilar, or axillary lymph nodes. Thyroid gland, trachea, and esophagus demonstrate no significant findings. Lungs/Pleura: Mild-to-moderate severity diffusely interstitial thickening is seen. Mild atelectatic changes are also seen  within the posterior aspect of the right upper lobe and bilateral lung bases. There are small bilateral pleural effusions, right greater than left. No pneumothorax is identified. Upper Abdomen: No acute abnormality. Musculoskeletal: No chest wall abnormality. No acute or significant osseous findings. Review of the MIP images confirms the above findings. IMPRESSION: 1. No evidence of pulmonary embolism. 2. Mild-to-moderate severity interstitial edema with small bilateral pleural effusions, right greater than left. Electronically Signed   By: Aram Candela M.D.   On: 08/14/2023 20:06   DG Chest 2 View Result Date: 08/14/2023 CLINICAL DATA:  Tachycardia EXAM: CHEST - 2 VIEW COMPARISON:  February 20, 2022 FINDINGS: The cardiomediastinal silhouette is enlarged in contour. No pleural effusion. No pneumothorax. Streaky opacity along the RIGHT inferior paramediastinal border. Visualized abdomen is unremarkable. No acute osseous abnormality noted. IMPRESSION: 1. Streaky opacity along the RIGHT inferior paramediastinal border. This  could reflect atelectasis versus infection. Recommend follow-up PA and lateral chest radiograph in 4-6 weeks to assess for resolution. 2. Enlarged cardiac silhouette. Electronically Signed   By: Meda Klinefelter M.D.   On: 08/14/2023 16:38    Procedures Procedures: not indicated.   Medications Ordered in ED Medications  sodium chloride 0.9 % bolus 500 mL (500 mLs Intravenous New Bag/Given 08/14/23 1832)  iohexol (OMNIPAQUE) 350 MG/ML injection 75 mL (75 mLs Intravenous Contrast Given 08/14/23 1955)  sodium chloride 0.9 % bolus 1,000 mL (1,000 mLs Intravenous New Bag/Given 08/14/23 2111)    ED Course/ Medical Decision Making/ A&P                                 Medical Decision Making Amount and/or Complexity of Data Reviewed Labs: ordered. Radiology: ordered.  Risk Prescription drug management.   This patient presents to the ED for concern of tachycardia and shortness of breath, this involves an extensive number of treatment options, and is a complaint that carries with it a high risk of complications and morbidity.   Differential diagnosis includes: ACS, PE, pneumonia, dehydration, viral illness, sepsis, etc.   Comorbidities  See HPI above   Additional History  Additional history obtained from urgent care note UA at urgent care showed proteinuria, glucose, and trace leukocytes otherwise normal   Cardiac Monitoring / EKG  The patient was maintained on a cardiac monitor.  I personally viewed and interpreted the cardiac monitored which showed: sinus tachycardia with a heart rate of 120 bpm.   Lab Tests  I ordered and personally interpreted labs.  The pertinent results include:   Elevated D-dimer at 0.75 CBC is unremarkable Negative respiratory panel Negative troponin Lactic acid, blood cultures, and urine pending at shift change.   Imaging Studies  I ordered imaging studies including CXR and CTA PE study  I independently visualized and interpreted imaging which  showed:  X-ray shows streaky opacity along the right inferior paramediastinal border.  This could reflect atelectasis versus infection. CTA PE study negative for pulmonary embolism.  Mild to moderate severity interstitial edema with small bilateral pleural effusions, right > left. I agree with the radiologist interpretation   Problem List / ED Course / Critical Interventions / Medication Management  Shortness of breath and dysuria for the past several days.  No chest pain. Went to urgent care and had elevated heart rate of 112 advised to come to the ER for further evaluation.  Denies any chest pain. Elevated d-dimer but CT negative for PE. Showed some interstitial edema  and small pleural effusions in the bilateral lungs.  I ordered medications including: NS for tachycardia  Reevaluation of the patient after these medicines showed that the patient stayed the same I have reviewed the patients home medicines and have made adjustments as needed   Social Determinants of Health  Access to healthcare   Test / Admission - Considered  Patient care taken over by Langston Masker, PA-C, at shift change. Disposition pending labs.        Final Clinical Impression(s) / ED Diagnoses Final diagnoses:  Shortness of breath    Rx / DC Orders ED Discharge Orders     None         Maxwell Marion, PA-C 08/14/23 2156    Vanetta Mulders, MD 08/15/23 2008

## 2023-08-14 NOTE — ED Provider Notes (Signed)
 Patient's care assumed at 10 PM.  Patient is pending UA and lactic acid. UA is negative lactic acid is normal.  Patient reevaluated by me.  Patient's heart rate is 108.   Patient reevaluated at 10:30 PM.  Patient has a slight cough.  She seems overall well.  I advised her to follow-up with her primary care physician for evaluation of tachycardia.  Patient has a cough.  She may be developing a viral illness.  Patient advised Tylenol if she has fever or bodyaches return to the emergency department if any problems.   Elson Areas, PA-C 08/14/23 2241    Rozelle Logan, DO 08/14/23 2321

## 2023-08-14 NOTE — ED Triage Notes (Signed)
 Pt c/o int tachycardia and SOBx4d. Pt denies chest pain. Pt is eupneic.

## 2023-08-14 NOTE — Discharge Instructions (Signed)
Recommend further evaluation in the Emergency Department

## 2023-08-14 NOTE — ED Notes (Signed)
 Patient is being discharged from the Urgent Care and sent to the Emergency Department via private vehicle . Per J. Ward, PA, patient is in need of higher level of care due to hyperglycemia. Patient is aware and verbalizes understanding of plan of care.  Vitals:   08/14/23 1300  BP: (!) 144/95  Pulse: (!) 112  Resp: 18  Temp: 98.7 F (37.1 C)  SpO2: 100%

## 2023-08-16 LAB — CERVICOVAGINAL ANCILLARY ONLY
Bacterial Vaginitis (gardnerella): POSITIVE — AB
Candida Glabrata: NEGATIVE
Candida Vaginitis: NEGATIVE
Chlamydia: NEGATIVE
Comment: NEGATIVE
Comment: NEGATIVE
Comment: NEGATIVE
Comment: NEGATIVE
Comment: NEGATIVE
Comment: NORMAL
Neisseria Gonorrhea: NEGATIVE
Trichomonas: NEGATIVE

## 2023-08-16 LAB — GC/CHLAMYDIA PROBE AMP (~~LOC~~) NOT AT ARMC
Chlamydia: NEGATIVE
Comment: NEGATIVE
Comment: NORMAL
Neisseria Gonorrhea: NEGATIVE

## 2023-08-17 ENCOUNTER — Telehealth (HOSPITAL_COMMUNITY): Payer: Self-pay

## 2023-08-17 MED ORDER — METRONIDAZOLE 500 MG PO TABS
500.0000 mg | ORAL_TABLET | Freq: Two times a day (BID) | ORAL | 0 refills | Status: AC
Start: 2023-08-17 — End: 2023-08-24

## 2023-08-17 NOTE — Telephone Encounter (Signed)
 Per protocol, pt requires tx with metronidazole. Rx sent to pharmacy on file.

## 2023-08-19 LAB — CULTURE, BLOOD (ROUTINE X 2)
Culture: NO GROWTH
Special Requests: ADEQUATE

## 2023-09-03 ENCOUNTER — Encounter (HOSPITAL_COMMUNITY): Payer: Self-pay | Admitting: *Deleted

## 2023-09-03 ENCOUNTER — Ambulatory Visit (HOSPITAL_COMMUNITY)
Admission: EM | Admit: 2023-09-03 | Discharge: 2023-09-03 | Disposition: A | Discharge status: Home / Self Care | Payer: MEDICAID | Attending: Emergency Medicine | Admitting: Emergency Medicine

## 2023-09-03 DIAGNOSIS — R350 Frequency of micturition: Secondary | ICD-10-CM

## 2023-09-03 DIAGNOSIS — B9689 Other specified bacterial agents as the cause of diseases classified elsewhere: Secondary | ICD-10-CM

## 2023-09-03 DIAGNOSIS — N760 Acute vaginitis: Secondary | ICD-10-CM | POA: Diagnosis not present

## 2023-09-03 LAB — POCT URINALYSIS DIP (MANUAL ENTRY)
Bilirubin, UA: NEGATIVE
Glucose, UA: NEGATIVE mg/dL
Ketones, POC UA: NEGATIVE mg/dL
Nitrite, UA: NEGATIVE
Protein Ur, POC: NEGATIVE mg/dL
Spec Grav, UA: 1.015
Urobilinogen, UA: 1 U/dL
pH, UA: 6.5

## 2023-09-03 MED ORDER — CLINDAMYCIN HCL 300 MG PO CAPS: 300.0000 mg | ORAL_CAPSULE | Freq: Two times a day (BID) | ORAL | 0 refills | Status: AC

## 2023-09-03 NOTE — ED Provider Notes (Signed)
 [facility]    CSN: [id number] Arrival date & time: 09/03/23  7829      History   Chief Complaint Chief Complaint  Patient presents with   Urinary Frequency    HPI Cheryl Curry is a 38 y.o. female.   Patient presents today with urinary frequency.  States that she was previously seen and treated for BV and feels that the symptoms never resolved.  Patient continues to have similar symptoms.  Denies any lower abdominal pain no flank pain no nausea vomiting or diarrhea.  Denies any current vaginal discharge at this time.  States she has a follow-up appointment with her PCP on April 1.     Past Medical History:  Diagnosis Date   Diabetes mellitus without complication (HCC)    Hypertension    Pregnancy induced hypertension     Patient Active Problem List   Diagnosis Date Noted   Essential hypertension 08/09/2019   Type 2 diabetes mellitus (HCC) 08/09/2019   Encounter for insertion subdermal contraceptive 05/27/2018    Past Surgical History:  Procedure Laterality Date   NO PAST SURGERIES      OB History     Gravida  3   Para  3   Term  2   Preterm  1   AB  0   Living  3      SAB      IAB      Ectopic      Multiple  0   Live Births  3            Home Medications    Prior to Admission medications   Medication Sig Start Date End Date Taking? Authorizing Provider  clindamycin (CLEOCIN) 300 MG capsule Take 1 capsule (300 mg total) by mouth 2 (two) times daily for 7 days. 09/03/23 09/10/23 Yes Coralyn Mark, NP  glipiZIDE (GLUCOTROL) 5 MG tablet Take by mouth. 04/28/22  Yes [provider]  insulin glargine (LANTUS) 100 UNIT/ML injection Inject into the skin daily.   Yes [provider]  insulin isophane & regular human KwikPen (HUMULIN 70/30 KWIKPEN) (70-30) 100 UNIT/ML KwikPen Inject into the skin. 06/02/22  Yes [provider]  lisinopril-hydrochlorothiazide (ZESTORETIC) 20-25 MG tablet Take  by mouth. 12/30/21  Yes [provider]  Semaglutide, 1 MG/DOSE, (OZEMPIC, 1 MG/DOSE,) 4 MG/3ML SOPN Inject 1 mg into the skin once a week. 07/08/23  Yes [provider]  cyclobenzaprine (FLEXERIL) 10 MG tablet Take 1 tablet (10 mg total) by mouth 2 (two) times daily as needed for muscle spasms. 12/02/22   Bing Neighbors, NP  Etonogestrel [facility]) Inject into the skin.    [provider]  naproxen (NAPROSYN) 375 MG tablet Take 1 tablet (375 mg total) by mouth 2 (two) times daily. 12/02/22   Bing Neighbors, NP  insulin aspart (NOVOLOG) 100 UNIT/ML injection 30 units with breakfast; 20 units with lunch and 30 units with dinner Patient not taking: Reported on 05/27/2018 04/18/18 03/01/19  Reva Bores, MD  insulin NPH Human (HUMULIN N,NOVOLIN N) 100 UNIT/ML injection 46 units in the morning and 46 units at bedtime Patient not taking: Reported on 05/27/2018 04/18/18 03/01/19  Reva Bores, MD  labetalol (NORMODYNE) 200 MG tablet Take 3 tablets (600 mg total) by mouth 3 (three) times daily. Patient not taking: Reported on 05/27/2018 04/25/18 03/01/19  Donette Larry, CNM  medroxyPROGESTERone (PROVERA) 10 MG tablet Take 1 tablet (10 mg total) by mouth daily. 08/24/18  03/01/19  Adam Phenix, MD  NIFEdipine (PROCARDIA-XL/NIFEDICAL-XL) 30 MG 24 hr tablet Take 1 tablet (30 mg total) by mouth 2 (two) times daily. 05/27/18 03/01/19  Gerrit Heck, CNM    Family History Family History  Problem Relation Age of Onset   Hypertension Mother    Hypertension Father     Social History Social History   Tobacco Use   Smoking status: Never   Smokeless tobacco: Never  Vaping Use   Vaping status: Never Used  Substance Use Topics   Alcohol use: Yes    Comment: occassional   Drug use: No     Allergies   Patient has no known allergies.   Review of Systems Review of Systems  Constitutional:  Negative for fever.  Respiratory: Negative.    Cardiovascular:  Negative.   Gastrointestinal: Negative.   Genitourinary:  Positive for frequency. Negative for vaginal bleeding, vaginal discharge and vaginal pain.  Neurological: Negative.      Physical Exam Triage Vital Signs ED Triage Vitals  Encounter Vitals Group     BP 09/03/23 0841 (!) 142/98     Systolic BP Percentile --      Diastolic BP Percentile --      Pulse Rate 09/03/23 0841 (!) 105     Resp 09/03/23 0841 18     Temp 09/03/23 0841 98 F (36.7 C)     Temp Source 09/03/23 0841 Oral     SpO2 09/03/23 0841 98 %     Weight --      Height --      Head Circumference --      Peak Flow --      Pain Score 09/03/23 0840 0     Pain Loc --      Pain Education --      Exclude from Growth Chart --    No data found.  Updated Vital Signs BP (!) 142/98 (BP Location: Right Arm)   Pulse (!) 105   Temp 98 F (36.7 C) (Oral)   Resp 18   SpO2 98%   Visual Acuity Right Eye Distance:   Left Eye Distance:   Bilateral Distance:    Right Eye Near:   Left Eye Near:    Bilateral Near:     Physical Exam Constitutional:      Appearance: Normal appearance.  Cardiovascular:     Rate and Rhythm: Normal rate.  Pulmonary:     Effort: Pulmonary effort is normal.  Abdominal:     General: Abdomen is flat.     Tenderness: There is no right CVA tenderness or left CVA tenderness.  Neurological:     Mental Status: She is alert.      UC Treatments / Results  Labs (all labs ordered are listed, but only abnormal results are displayed) Labs Reviewed  POCT URINALYSIS DIP (MANUAL ENTRY) - Abnormal; Notable for the following components:      Result Value   Clarity, UA cloudy (*)    Blood, UA moderate (*)    Leukocytes, UA Small (1+) (*)    All other components within normal limits    EKG   Radiology No results found.  Procedures Procedures (including critical care time)  Medications Ordered in UC Medications - No data to display  Initial Impression / Assessment and Plan / UC  Course  I have reviewed the triage vital signs and the nursing notes.  Pertinent labs & imaging results that were available during my care of the patient  were reviewed by me and considered in my medical decision making (see chart for details).     Patient is to keep her appointment in April 1 with her PCP and have a follow-up for urinalysis Discussed changing the medication that patient was previously on to try something different at this time Also discussed with patient about controlling sugars as this can cause frequency.  Patient states that her PCP is following her for this. If symptoms do come worse before her appointment patient will need to follow-up in the ED Take full dose of medication even if she is feeling better with food Final Clinical Impressions(s) / UC Diagnoses   Final diagnoses:  Urinary frequency  Bacterial vaginosis     Discharge Instructions      Keep your appointment with your physician on April 1 have them follow-up on urinalysis Discussed switching the medication that she was previously on Take with food Take full doses even if you are feeling better     ED Prescriptions     Medication Sig Dispense Auth. Provider   clindamycin (CLEOCIN) 300 MG capsule Take 1 capsule (300 mg total) by mouth 2 (two) times daily for 7 days. 14 capsule Coralyn Mark, NP      PDMP not reviewed this encounter.   Coralyn Mark, NP 09/03/23 [phone]

## 2023-09-03 NOTE — Discharge Instructions (Addendum)
 Keep your appointment with your physician on April 1 have them follow-up on urinalysis Discussed switching the medication that she was previously on Take with food Take full doses even if you are feeling better

## 2023-09-03 NOTE — ED Triage Notes (Signed)
 Pt states she is having urine frequency, States she was treated for BV a couple weeks.ago.but still having sx. She did take all the meds given at the time.

## 2023-09-04 ENCOUNTER — Ambulatory Visit (HOSPITAL_COMMUNITY): Payer: Self-pay

## 2023-09-09 ENCOUNTER — Ambulatory Visit
Admission: RE | Admit: 2023-09-09 | Discharge: 2023-09-09 | Disposition: A | Discharge status: Home / Self Care | Payer: MEDICAID | Source: Ambulatory Visit | Attending: Family | Admitting: Family

## 2023-09-09 ENCOUNTER — Other Ambulatory Visit: Payer: Self-pay | Admitting: Family

## 2023-09-09 DIAGNOSIS — J90 Pleural effusion, not elsewhere classified: Secondary | ICD-10-CM

## 2023-10-14 ENCOUNTER — Other Ambulatory Visit: Payer: Self-pay

## 2023-10-14 ENCOUNTER — Encounter (HOSPITAL_COMMUNITY): Payer: Self-pay

## 2023-10-14 ENCOUNTER — Observation Stay (HOSPITAL_COMMUNITY)
Admission: EM | Admit: 2023-10-14 | Discharge: 2023-10-15 | Disposition: A | Discharge status: Home / Self Care | Payer: MEDICAID | Attending: Family Medicine | Admitting: Family Medicine

## 2023-10-14 ENCOUNTER — Emergency Department (HOSPITAL_COMMUNITY): Payer: MEDICAID

## 2023-10-14 DIAGNOSIS — I509 Heart failure, unspecified: Secondary | ICD-10-CM | POA: Diagnosis not present

## 2023-10-14 DIAGNOSIS — I110 Hypertensive heart disease with heart failure: Secondary | ICD-10-CM | POA: Insufficient documentation

## 2023-10-14 DIAGNOSIS — Z7985 Long-term (current) use of injectable non-insulin antidiabetic drugs: Secondary | ICD-10-CM | POA: Diagnosis not present

## 2023-10-14 DIAGNOSIS — Z79899 Other long term (current) drug therapy: Secondary | ICD-10-CM | POA: Diagnosis not present

## 2023-10-14 DIAGNOSIS — Z7984 Long term (current) use of oral hypoglycemic drugs: Secondary | ICD-10-CM | POA: Diagnosis not present

## 2023-10-14 DIAGNOSIS — I5021 Acute systolic (congestive) heart failure: Principal | ICD-10-CM

## 2023-10-14 DIAGNOSIS — Z794 Long term (current) use of insulin: Secondary | ICD-10-CM | POA: Diagnosis not present

## 2023-10-14 DIAGNOSIS — E119 Type 2 diabetes mellitus without complications: Secondary | ICD-10-CM | POA: Diagnosis not present

## 2023-10-14 DIAGNOSIS — I10 Essential (primary) hypertension: Secondary | ICD-10-CM | POA: Diagnosis present

## 2023-10-14 DIAGNOSIS — R0602 Shortness of breath: Secondary | ICD-10-CM | POA: Diagnosis present

## 2023-10-14 LAB — CBC WITH DIFFERENTIAL/PLATELET
Abs Immature Granulocytes: 0.02 10*3/uL (ref 0.00–0.07)
Basophils Absolute: 0.1 10*3/uL (ref 0.0–0.1)
Basophils Relative: 1 %
Eosinophils Absolute: 0 10*3/uL (ref 0.0–0.5)
Eosinophils Relative: 1 %
HCT: 41.3 % (ref 36.0–46.0)
Hemoglobin: 13.8 g/dL (ref 12.0–15.0)
Immature Granulocytes: 0 %
Lymphocytes Relative: 43 %
Lymphs Abs: 2.9 10*3/uL (ref 0.7–4.0)
MCH: 31.4 pg (ref 26.0–34.0)
MCHC: 33.4 g/dL (ref 30.0–36.0)
MCV: 93.9 fL (ref 80.0–100.0)
Monocytes Absolute: 0.5 10*3/uL (ref 0.1–1.0)
Monocytes Relative: 7 %
Neutro Abs: 3.3 10*3/uL (ref 1.7–7.7)
Neutrophils Relative %: 48 %
Platelets: 196 10*3/uL (ref 150–400)
RBC: 4.4 MIL/uL (ref 3.87–5.11)
RDW: 12.7 % (ref 11.5–15.5)
WBC: 6.8 10*3/uL (ref 4.0–10.5)
nRBC: 0 % (ref 0.0–0.2)

## 2023-10-14 LAB — GLUCOSE, CAPILLARY: Glucose-Capillary: 301 mg/dL — ABNORMAL HIGH (ref 70–99)

## 2023-10-14 LAB — BASIC METABOLIC PANEL WITH GFR
Anion gap: 10 (ref 5–15)
BUN: 15 mg/dL (ref 6–20)
CO2: 22 mmol/L (ref 22–32)
Calcium: 8.7 mg/dL — ABNORMAL LOW (ref 8.9–10.3)
Chloride: 103 mmol/L (ref 98–111)
Creatinine, Ser: 0.87 mg/dL (ref 0.44–1.00)
GFR, Estimated: >60 mL/min (ref >60)
Glucose, Bld: 217 mg/dL — ABNORMAL HIGH (ref 70–99)
Potassium: 3.9 mmol/L (ref 3.5–5.1)
Sodium: 135 mmol/L (ref 135–145)

## 2023-10-14 LAB — BRAIN NATRIURETIC PEPTIDE: B Natriuretic Peptide: 902.9 pg/mL — ABNORMAL HIGH (ref 0.0–100.0)

## 2023-10-14 MED ORDER — HYDRALAZINE HCL 10 MG PO TABS: 10.0000 mg | ORAL_TABLET | Freq: Once | ORAL | Status: DC

## 2023-10-14 MED ORDER — BISACODYL 5 MG PO TBEC: 5.0000 mg | DELAYED_RELEASE_TABLET | Freq: Every day | ORAL | Status: DC | PRN

## 2023-10-14 MED ORDER — INSULIN GLARGINE-YFGN 100 UNIT/ML ~~LOC~~ SOLN
15.0000 [IU] | Freq: Every day | SUBCUTANEOUS | Status: DC
  Administered 2023-10-14: 15 [IU] via SUBCUTANEOUS
  Filled 2023-10-14 (×2): qty 0.15

## 2023-10-14 MED ORDER — INSULIN ASPART 100 UNIT/ML IJ SOLN
0.0000 [IU] | Freq: Three times a day (TID) | INTRAMUSCULAR | Status: DC
  Administered 2023-10-15 (×3): 2 [IU] via SUBCUTANEOUS

## 2023-10-14 MED ORDER — ACETAMINOPHEN 325 MG PO TABS: 650.0000 mg | ORAL_TABLET | Freq: Four times a day (QID) | ORAL | Status: DC | PRN

## 2023-10-14 MED ORDER — HYDRALAZINE HCL 20 MG/ML IJ SOLN
10.0000 mg | Freq: Four times a day (QID) | INTRAMUSCULAR | Status: DC | PRN
  Filled 2023-10-14: qty 1

## 2023-10-14 MED ORDER — ENOXAPARIN SODIUM 40 MG/0.4ML IJ SOSY
40.0000 mg | PREFILLED_SYRINGE | INTRAMUSCULAR | Status: DC
  Administered 2023-10-14: 40 mg via SUBCUTANEOUS
  Filled 2023-10-14: qty 0.4

## 2023-10-14 MED ORDER — ACETAMINOPHEN 650 MG RE SUPP: 650.0000 mg | Freq: Four times a day (QID) | RECTAL | Status: DC | PRN

## 2023-10-14 MED ORDER — FUROSEMIDE 10 MG/ML IJ SOLN
40.0000 mg | Freq: Once | INTRAMUSCULAR | Status: AC
  Administered 2023-10-14: 40 mg via INTRAVENOUS
  Filled 2023-10-14: qty 4

## 2023-10-14 MED ORDER — INSULIN ASPART 100 UNIT/ML IJ SOLN
0.0000 [IU] | Freq: Every day | INTRAMUSCULAR | Status: DC
  Administered 2023-10-14: 4 [IU] via SUBCUTANEOUS

## 2023-10-14 MED ORDER — ALBUTEROL SULFATE (2.5 MG/3ML) 0.083% IN NEBU: 2.5000 mg | INHALATION_SOLUTION | Freq: Four times a day (QID) | RESPIRATORY_TRACT | Status: DC | PRN

## 2023-10-14 MED ORDER — POLYETHYLENE GLYCOL 3350 17 G PO PACK: 17.0000 g | PACK | Freq: Every day | ORAL | Status: DC | PRN

## 2023-10-14 MED ORDER — FUROSEMIDE 10 MG/ML IJ SOLN
40.0000 mg | Freq: Two times a day (BID) | INTRAMUSCULAR | Status: DC
  Administered 2023-10-15 (×2): 40 mg via INTRAVENOUS
  Filled 2023-10-14 (×2): qty 4

## 2023-10-14 NOTE — H&P (Signed)
 [facility] History and Physical  Cheryl Curry UJW:[id number] DOB: Sep 10, 1985 DOA: 10/14/2023 PCP: [facility]  Presented from: Home Chief Complaint: Shortness of breath  History of Present Illness: Cheryl Curry is a 38 y.o. female with PMH significant for DM2, HTN Patient presented to the ED today with complaint of shortness of breath for past 3 days along with bilateral lower extremity swelling.  Patient felt her symptoms were worse today when she was lying in the tub. 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  She is not sure if she has noticed any limitation in exercise capacity.  She states, ' it is always tiring anyway.' No known history of congestive heart failure, OSA, PE. She is on lisinopril /HCTZ for hypertension and reports compliance to it.  It seems she had similar presentation 2 months ago on 3/8.  CT angio chest was obtained at that time which did not show pulm embolism but showed mild to moderate severity interstitial edema with bilateral pleural effusion right greater than left. I do not see any diuresis or further work up started at that time.  In the ED, patient was afebrile, heart rate in 100s, blood pressure in 150s, breathing on room air Labs with CBC unremarkable, glucose elevated 217, renal function normal, BNP elevated to 903 Chest x-ray unremarkable  Patient was given 1 dose of IV Lasix 40 mg Hospitalist service was consulted for inpatient management.  At the time of my evaluation, patient was propped up in bed.  Not in distress.  Not on supplemental oxygen.  Felt a little better after IV Lasix earlier. History reviewed in detail as above.  Review of Systems:  All systems were reviewed and were negative unless otherwise mentioned in the HPI   Past medical history: Past Medical History:  Diagnosis Date   Diabetes mellitus without complication (HCC)     Hypertension    Pregnancy induced hypertension     Past surgical history: Past Surgical History:  Procedure Laterality Date   NO PAST SURGERIES      Social History:  reports that she has never smoked. She has never used smokeless tobacco. She reports current alcohol use. She reports that she does not use drugs.  Allergies:  No Known Allergies Patient has no known allergies.   Family history:  Family History  Problem Relation Age of Onset   Hypertension Mother    Hypertension Father      Physical Exam: Vitals:   10/14/23 1608 10/14/23 1815 10/14/23 1942  BP: (!) 158/114 (!) 142/116 (!) 154/128  Pulse: (!) 115 (!) 108 (!) 116  Resp: 20 11 16   Temp: 98.5 F (36.9 C)  98.1 F (36.7 C)  TempSrc: Oral  Oral  SpO2: 97% 98% 99%  Weight: 82.1 kg    Height: 5\' 6"  (1.676 m)     Wt Readings from Last 3 Encounters:  10/14/23 82.1 kg  08/14/23 89.7 kg  07/23/23 89.7 kg   Body mass index is 29.21 kg/m.  General exam: Pleasant, young African-American female.  Not in distress Skin: No rashes, lesions or ulcers. HEENT: Atraumatic, normocephalic, no obvious bleeding Lungs: Crackles heard over right lower lobe.  Otherwise clear to auscultation bilaterally,  CVS: Regular tachycardia, no murmur GI/Abd: Soft, nontender, nondistended, bowel sound present,   CNS: Alert, awake, oriented x 3 Psychiatry: Mood appropriate,  Extremities: Trace bilateral pedal edema, no calf tenderness,    ----------------------------------------------------------------------------------------------------------------------------------------- ----------------------------------------------------------------------------------------------------------------------------------------- -----------------------------------------------------------------------------------------------------------------------------------------  Assessment/Plan:  Suspect new CHF  Hypertension Presented with intermittent shortness of  breath, orthopnea, bilateral lower extremity edema. No prior history of CHF or OSA.  On lisinopril  HCTZ for blood pressure control.   Her symptoms are suspicious for new onset CHF  Had similar symptoms 2 months ago.  CT scan at that time had shown mild to moderate severity interstitial edema and bilateral pleural effusion. Chest x-ray today did not show any similar findings. However, BNP is elevated. IV Lasix 40 mg was given in the ED.  Start 40 mg IV Lasix twice daily Obtain echocardiogram Monitor electrolytes, renal function, blood pressure. Hold lisinopril /HCTZ  Type 2 diabetes mellitus uncontrolled A1c was more than 14 in 2022.  Update A1c PTA meds-Ozempic, Lantus 30 units nightly, Premeal insulin , glipizide Blood sugar level over 200. Start Semglee 15 units tonight and SSI/Accu-Cheks No results for input(s): "GLUCAP" in the last 168 hours.  Mobility: Encourage ambulation  Goals of care:   Code Status: Full Code    DVT prophylaxis:  enoxaparin (LOVENOX) injection 40 mg Start: 10/14/23 2200   Antimicrobials: None Fluid: None Consultants: None Family Communication: None at bedside  Status: Observation Level of care:  Telemetry   Patient is from: Home Anticipated d/c to: Home hopefully in 1 to 2 days  Diet:  Diet Order             Diet Heart Room service appropriate? Yes; Fluid consistency: Thin  Diet effective now                    ------------------------------------------------------------------------------------- Severity of Illness: The appropriate patient status for this patient is OBSERVATION. Observation status is judged to be reasonable and necessary in order to provide the required intensity of service to ensure the patient's safety. The patient's presenting symptoms, physical exam findings, and initial radiographic and laboratory data in the context of their medical condition is felt to place them at decreased risk for further clinical deterioration.  Furthermore, it is anticipated that the patient will be medically stable for discharge from the hospital within 2 midnights of admission.  -------------------------------------------------------------------------------------   Home Meds: Prior to Admission medications   Medication Sig Start Date End Date Taking? Authorizing Provider  acetaminophen  (TYLENOL ) 500 MG tablet Take 1,000 mg by mouth daily as needed for moderate pain (pain score 4-6). 08/09/19   [provider]  cyclobenzaprine  (FLEXERIL ) 10 MG tablet Take 1 tablet (10 mg total) by mouth 2 (two) times daily as needed for muscle spasms. 12/02/22   Buena Carmine, NP  Etonogestrel  (NEXPLANON  ) Inject into the skin.    [provider]  glipiZIDE (GLUCOTROL) 5 MG tablet Take by mouth. 04/28/22   [provider]  insulin  glargine (LANTUS) 100 UNIT/ML injection Inject into the skin daily.    [provider]  insulin  isophane & regular human KwikPen (HUMULIN  70/30 KWIKPEN) (70-30) 100 UNIT/ML KwikPen Inject into the skin. 06/02/22   [provider]  lisinopril -hydrochlorothiazide  (ZESTORETIC) 20-25 MG tablet Take by mouth. 12/30/21   [provider]  naproxen  (NAPROSYN ) 375 MG tablet Take 1 tablet (375 mg total) by mouth 2 (two) times daily. 12/02/22   Buena Carmine, NP  Semaglutide, 1 MG/DOSE, (OZEMPIC, 1 MG/DOSE,) 4 MG/3ML SOPN Inject 1 mg into the skin once a week. 07/08/23   [provider]  insulin  aspart (NOVOLOG ) 100 UNIT/ML injection 30 units with breakfast; 20 units with lunch and 30 units with dinner Patient not taking: Reported on 05/27/2018 04/18/18 03/01/19  Granville Layer, MD  insulin  NPH Human (HUMULIN  N,NOVOLIN N) 100 UNIT/ML injection 46 units in the morning and 46 units at bedtime Patient not taking: Reported on 05/27/2018 04/18/18 03/01/19  Granville Layer, MD  labetalol  (NORMODYNE ) 200 MG tablet Take 3 tablets (600 mg total) by mouth 3 (three) times  daily. Patient not taking: Reported on 05/27/2018 04/25/18 03/01/19  Kit Peri, CNM  medroxyPROGESTERone  (PROVERA ) 10 MG tablet Take 1 tablet (10 mg total) by mouth daily. 08/24/18 03/01/19  Tresia Fruit, MD  NIFEdipine  (PROCARDIA -XL/NIFEDICAL-XL) 30 MG 24 hr tablet Take 1 tablet (30 mg total) by mouth 2 (two) times daily. 05/27/18 03/01/19  Loetta Ringer, CNM    Labs on Admission:   CBC: Recent Labs  Lab 10/14/23 1713  WBC 6.8  NEUTROABS 3.3  HGB 13.8  HCT 41.3  MCV 93.9  PLT 196    Basic Metabolic Panel: Recent Labs  Lab 10/14/23 1713  NA 135  K 3.9  CL 103  CO2 22  GLUCOSE 217*  BUN 15  CREATININE 0.87  CALCIUM 8.7*    Liver Function Tests: No results for input(s): "AST", "ALT", "ALKPHOS", "BILITOT", "PROT", "ALBUMIN" in the last 168 hours. No results for input(s): "LIPASE", "AMYLASE" in the last 168 hours. No results for input(s): "AMMONIA" in the last 168 hours.  Cardiac Enzymes: No results for input(s): "CKTOTAL", "CKMB", "CKMBINDEX", "TROPONINI" in the last 168 hours.  BNP (last 3 results) Recent Labs    10/14/23 1713  BNP 902.9*    ProBNP (last 3 results) No results for input(s): "PROBNP" in the last 8760 hours.  CBG: No results for input(s): "GLUCAP" in the last 168 hours.  Lipase  No results found for: "LIPASE"   Urinalysis    Component Value Date/Time   COLORURINE STRAW (A) 08/14/2023 2205   APPEARANCEUR CLEAR 08/14/2023 2205   LABSPEC 1.026 08/14/2023 2205   PHURINE 7.0 08/14/2023 2205   GLUCOSEU NEGATIVE 08/14/2023 2205   HGBUR NEGATIVE 08/14/2023 2205   BILIRUBINUR negative 09/03/2023 0849   KETONESUR negative 09/03/2023 0849   KETONESUR NEGATIVE 08/14/2023 2205   PROTEINUR negative 09/03/2023 0849   PROTEINUR NEGATIVE 08/14/2023 2205   UROBILINOGEN 1.0 09/03/2023 0849   UROBILINOGEN 0.2 09/13/2022 1630   NITRITE Negative 09/03/2023 0849   NITRITE NEGATIVE 08/14/2023 2205   LEUKOCYTESUR Small (1+) (A) 09/03/2023 0849    LEUKOCYTESUR NEGATIVE 08/14/2023 2205     Drugs of Abuse  No results found for: "LABOPIA", "COCAINSCRNUR", "LABBENZ", "AMPHETMU", "THCU", "LABBARB"    Radiological Exams on Admission: DG Chest 2 View Result Date: 10/14/2023 CLINICAL DATA:  Shortness of breath and cough for 3 days. EXAM: CHEST - 2 VIEW COMPARISON:  September 09, 2023. FINDINGS: Stable cardiomediastinal silhouette. Both lungs are clear. The visualized skeletal structures are unremarkable. IMPRESSION: No active cardiopulmonary disease. Electronically Signed   By: Rosalene Colon M.D.   On: 10/14/2023 18:11     Signed, Hoyt Macleod, MD [facility] 10/14/2023

## 2023-10-14 NOTE — ED Provider Notes (Signed)
 [place] [facility]ERGENC[facility] DEPARTMENT AT [facility] Provider Note   CSN: [id number] Arrival date & time: 10/14/23  1510     History  Chief Complaint  Patient presents with   Shortness of Breath    Cheryl Curry is a 38 y.o. female.  38 year old female presents with complaint of SHOB for the past 3 days, also ankle swelling. Symptoms were worse today while lying in the tub. Similar presentation 1 month ago. No history of asthma or chronic lung disease. History of diabetes, HTN, reports med compliance and no missed doses. No fevers, chest pain ,cough.        Home Medications Prior to Admission medications   Medication Sig Start Date End Date Taking? Authorizing Provider  acetaminophen  (TYLENOL ) 500 MG tablet Take 1,000 mg by mouth daily as needed for moderate pain (pain score 4-6). 08/09/19   [provider]  cyclobenzaprine  (FLEXERIL ) 10 MG tablet Take 1 tablet (10 mg total) by mouth 2 (two) times daily as needed for muscle spasms. 12/02/22   Buena Carmine, NP  Etonogestrel  (NEXPLANON  [place]) Inject into the skin.    [provider]  glipiZIDE (GLUCOTROL) 5 MG tablet Take by mouth. 04/28/22   [provider]  insulin  glargine (LANTUS ) 100 UNIT/ML injection Inject into the skin daily.    [provider]  insulin  isophane & regular human KwikPen (HUMULIN  70/30 KWIKPEN) (70-30) 100 UNIT/ML KwikPen Inject into the skin. 06/02/22   [provider]  lisinopril -hydrochlorothiazide  (ZESTORETIC) 20-25 MG tablet Take by mouth. 12/30/21   [provider]  naproxen  (NAPROSYN ) 375 MG tablet Take 1 tablet (375 mg total) by mouth 2 (two) times daily. 12/02/22   Buena Carmine, NP  Semaglutide, 1 MG/DOSE, (OZEMPIC, 1 MG/DOSE,) 4 MG/3ML SOPN Inject 1 mg into the skin once a week. 07/08/23   [provider]  insulin  aspart (NOVOLOG ) 100 UNIT/ML injection 30 units with breakfast; 20 units with lunch and 30 units with  dinner Patient not taking: Reported on 05/27/2018 04/18/18 03/01/19  Granville Layer, MD  insulin  NPH Human (HUMULIN  N,NOVOLIN N) 100 UNIT/ML injection 46 units in the morning and 46 units at bedtime Patient not taking: Reported on 05/27/2018 04/18/18 03/01/19  Granville Layer, MD  labetalol  (NORMODYNE ) 200 MG tablet Take 3 tablets (600 mg total) by mouth 3 (three) times daily. Patient not taking: Reported on 05/27/2018 04/25/18 03/01/19  Kit Peri, CNM  medroxyPROGESTERone  (PROVERA ) 10 MG tablet Take 1 tablet (10 mg total) by mouth daily. 08/24/18 03/01/19  Tresia Fruit, MD  NIFEdipine  (PROCARDIA -XL/NIFEDICAL-XL) 30 MG 24 hr tablet Take 1 tablet (30 mg total) by mouth 2 (two) times daily. 05/27/18 03/01/19  Loetta Ringer, CNM      Allergies    Patient has no known allergies.    Review of Systems   Review of Systems Negative except as per HPI Physical Exam Updated Vital Signs BP (!) 154/128 (BP Location: Right Arm)   Pulse (!) 116   Temp 98.1 F (36.7 C) (Oral)   Resp 16   Ht 5\' 6"  (1.676 m)   Wt 82.1 kg   SpO2 99%   BMI 29.21 kg/m  Physical Exam Vitals and nursing note reviewed.  Constitutional:      General: She is not in acute distress.    Appearance: She is well-developed. She is not diaphoretic.  HENT:     Head: Normocephalic and atraumatic.  Cardiovascular:     Rate and Rhythm: Regular rhythm. Tachycardia  present.  Pulmonary:     Effort: Pulmonary effort is normal.     Breath sounds: Examination of the right-lower field reveals decreased breath sounds. Examination of the left-lower field reveals decreased breath sounds. Decreased breath sounds present.  Musculoskeletal:     Cervical back: Neck supple.     Right lower leg: No tenderness. Edema present.     Left lower leg: No tenderness. Edema present.     Comments: Trace edema bilateral ankles   Neurological:     Mental Status: She is alert and oriented to person, place, and time.  Psychiatric:        Behavior:  Behavior normal.     ED Results / Procedures / Treatments   Labs (all labs ordered are listed, but only abnormal results are displayed) Labs Reviewed  BASIC METABOLIC PANEL WITH GFR - Abnormal; Notable for the following components:      Result Value   Glucose, Bld 217 (*)    Calcium 8.7 (*)    All other components within normal limits  BRAIN NATRIURETIC PEPTIDE - Abnormal; Notable for the following components:   B Natriuretic Peptide 902.9 (*)    All other components within normal limits  CBC WITH DIFFERENTIAL/PLATELET  HIV ANTIBODY (ROUTINE TESTING W REFLEX)  BASIC METABOLIC PANEL WITH GFR  CBC  TSH  BRAIN NATRIURETIC PEPTIDE  HEMOGLOBIN A1C    EKG None  Radiology DG Chest 2 View Result Date: 10/14/2023 CLINICAL DATA:  Shortness of breath and cough for 3 days. EXAM: CHEST - 2 VIEW COMPARISON:  September 09, 2023. FINDINGS: Stable cardiomediastinal silhouette. Both lungs are clear. The visualized skeletal structures are unremarkable. IMPRESSION: No active cardiopulmonary disease. Electronically Signed   By: Rosalene Colon M.D.   On: 10/14/2023 18:11    Procedures Procedures    Medications Ordered in ED Medications  acetaminophen  (TYLENOL ) tablet 650 mg (has no administration in time range)    Or  acetaminophen  (TYLENOL ) suppository 650 mg (has no administration in time range)  polyethylene glycol (MIRALAX  / GLYCOLAX ) packet 17 g (has no administration in time range)  bisacodyl  (DULCOLAX) EC tablet 5 mg (has no administration in time range)  albuterol  (PROVENTIL ) (2.5 MG/3ML) 0.083% nebulizer solution 2.5 mg (has no administration in time range)  hydrALAZINE  (APRESOLINE ) injection 10 mg (has no administration in time range)  enoxaparin  (LOVENOX ) injection 40 mg (has no administration in time range)  furosemide  (LASIX ) injection 40 mg (has no administration in time range)  insulin  aspart (novoLOG ) injection 0-9 Units (has no administration in time range)  insulin  aspart  (novoLOG ) injection 0-5 Units (has no administration in time range)  furosemide  (LASIX ) injection 40 mg (40 mg Intravenous Given 10/14/23 1851)    ED Course/ Medical Decision Making/ A&P                                 Medical Decision Making Amount and/or Complexity of Data Reviewed Labs: ordered. Radiology: ordered.  Risk Prescription drug management. Decision regarding hospitalization.   This patient presents to the ED for concern of [facility], this involves an extensive number of treatment options, and is a complaint that carries with it a high risk of complications and morbidity.  The differential diagnosis includes but not limited to CHF, PE, asthma, PNA, PNX   Co morbidities that complicate the patient evaluation  HTN, DM   Additional history obtained:  External records from outside source obtained and  reviewed including prior imaging on file including chest x-ray from March 8 as well as repeat chest x-ray on April 3 with improvement.  CT angio obtained 08/14/2023 with no evidence of PE, mild to moderate severity interstitial edema with small bilateral pleural effusions, right greater than left.   Lab Tests:  I Ordered, and personally interpreted labs.  The pertinent results include:  BMP glucose 217. CBC WNL. BNP elevated at 902   Imaging Studies ordered:  I ordered imaging studies including CXR  I independently visualized and interpreted imaging which showed cardiomegaly  I disagree with the radiologist interpretation   Cardiac Monitoring: / EKG:  The patient was maintained on a cardiac monitor.  I personally viewed and interpreted the cardiac monitored which showed an underlying rhythm of: sinus tachycardia, rate 109   Problem List / ED Course / Critical interventions / Medication management  38 year old female presents the emergency room with complaint of shortness of breath with a little lower extremity edema.  Patient is noted to be hypertensive without complaint  of chest pain.  Similar presentation 2 months ago.  Thought to be developing viral illness at that time.  Patient is slightly tachypneic, diminished lung sound in the bases, no history of asthma or chronic lung disease.  Chest x-ray appears to have cardiomegaly on my read.  Her BNP is notably elevated at 900.  She is hypertensive and mildly tachycardic.  Plan is to administer Lasix  and admit for new diagnosis CHF. I ordered medication including lasix   for CHF  Reevaluation of the patient after these medicines showed that the patient administered at time of admission  I have reviewed the patients home medicines and have made adjustments as needed   Consultations Obtained:  I requested consultation with the Dr. Gwynneth Lessen with [facility]  Hospitalist service,  and discussed lab and imaging findings as well as pertinent plan - they recommend: admission    Social Determinants of Health:  Has PCP not on epic, unable to review records    Test / Admission - Considered:  admit         Final Clinical Impression(s) / ED Diagnoses Final diagnoses:  Acute congestive heart failure, unspecified heart failure type [facility])    Rx / DC Orders ED Discharge Orders     None         Darlis Eisenmenger, PA-C 10/14/23 2031    Sallyanne Creamer, DO 10/18/23 [phone]

## 2023-10-14 NOTE — ED Triage Notes (Signed)
 Pt to er, pt states that she is here because she is breathing too fast, states that it started three days ago and it is making it hard for her to sleep.  States that she was seen a cone last month for the same

## 2023-10-15 ENCOUNTER — Observation Stay (HOSPITAL_BASED_OUTPATIENT_CLINIC_OR_DEPARTMENT_OTHER): Payer: MEDICAID

## 2023-10-15 ENCOUNTER — Other Ambulatory Visit (HOSPITAL_COMMUNITY): Payer: Self-pay

## 2023-10-15 ENCOUNTER — Encounter (HOSPITAL_COMMUNITY): Payer: Self-pay | Admitting: Internal Medicine

## 2023-10-15 DIAGNOSIS — I110 Hypertensive heart disease with heart failure: Secondary | ICD-10-CM | POA: Diagnosis not present

## 2023-10-15 DIAGNOSIS — I5021 Acute systolic (congestive) heart failure: Secondary | ICD-10-CM | POA: Diagnosis not present

## 2023-10-15 DIAGNOSIS — R9431 Abnormal electrocardiogram [ECG] [EKG]: Secondary | ICD-10-CM | POA: Diagnosis not present

## 2023-10-15 DIAGNOSIS — Z794 Long term (current) use of insulin: Secondary | ICD-10-CM | POA: Diagnosis not present

## 2023-10-15 DIAGNOSIS — E119 Type 2 diabetes mellitus without complications: Secondary | ICD-10-CM | POA: Diagnosis not present

## 2023-10-15 LAB — CBC
HCT: 40 % (ref 36.0–46.0)
Hemoglobin: 13.6 g/dL (ref 12.0–15.0)
MCH: 32.1 pg (ref 26.0–34.0)
MCHC: 34 g/dL (ref 30.0–36.0)
MCV: 94.3 fL (ref 80.0–100.0)
Platelets: 186 10*3/uL (ref 150–400)
RBC: 4.24 MIL/uL (ref 3.87–5.11)
RDW: 12.8 % (ref 11.5–15.5)
WBC: 6.9 10*3/uL (ref 4.0–10.5)
nRBC: 0 % (ref 0.0–0.2)

## 2023-10-15 LAB — BASIC METABOLIC PANEL WITH GFR
Anion gap: 10 (ref 5–15)
BUN: 16 mg/dL (ref 6–20)
CO2: 24 mmol/L (ref 22–32)
Calcium: 8.8 mg/dL — ABNORMAL LOW (ref 8.9–10.3)
Chloride: 105 mmol/L (ref 98–111)
Creatinine, Ser: 0.8 mg/dL (ref 0.44–1.00)
GFR, Estimated: >60 mL/min (ref >60)
Glucose, Bld: 154 mg/dL — ABNORMAL HIGH (ref 70–99)
Potassium: 3.6 mmol/L (ref 3.5–5.1)
Sodium: 139 mmol/L (ref 135–145)

## 2023-10-15 LAB — TSH: TSH: 2.452 u[IU]/mL (ref 0.350–4.500)

## 2023-10-15 LAB — BRAIN NATRIURETIC PEPTIDE: B Natriuretic Peptide: 872 pg/mL — ABNORMAL HIGH (ref 0.0–100.0)

## 2023-10-15 LAB — ECHOCARDIOGRAM COMPLETE
AR max vel: 2.18 cm2
AV Area VTI: 2.06 cm2
AV Area mean vel: 2.07 cm2
AV Mean grad: 3 mmHg
AV Peak grad: 4.8 mmHg
Ao pk vel: 1.09 m/s
Calc EF: 18 %
Height: 66 in
S' Lateral: 5.2 cm
Single Plane A2C EF: 21.6 %
Single Plane A4C EF: 9.8 %
Weight: 2896 [oz_av]

## 2023-10-15 LAB — GLUCOSE, CAPILLARY
Glucose-Capillary: 161 mg/dL — ABNORMAL HIGH (ref 70–99)
Glucose-Capillary: 193 mg/dL — ABNORMAL HIGH (ref 70–99)
Glucose-Capillary: 199 mg/dL — ABNORMAL HIGH (ref 70–99)

## 2023-10-15 LAB — HIV ANTIBODY (ROUTINE TESTING W REFLEX): HIV Screen 4th Generation wRfx: NONREACTIVE

## 2023-10-15 MED ORDER — HYDROCHLOROTHIAZIDE 25 MG PO TABS
25.0000 mg | ORAL_TABLET | Freq: Every day | ORAL | Status: DC
  Administered 2023-10-15: 25 mg via ORAL
  Filled 2023-10-15: qty 1

## 2023-10-15 MED ORDER — POTASSIUM CHLORIDE CRYS ER 20 MEQ PO TBCR
20.0000 meq | EXTENDED_RELEASE_TABLET | Freq: Every day | ORAL | 0 refills | Status: DC
  Filled 2023-10-15: qty 30, 30d supply, fill #0

## 2023-10-15 MED ORDER — LISINOPRIL 20 MG PO TABS: 20.0000 mg | ORAL_TABLET | Freq: Every day | ORAL | Status: DC

## 2023-10-15 MED ORDER — HYDRALAZINE HCL 25 MG PO TABS: 25.0000 mg | ORAL_TABLET | Freq: Four times a day (QID) | ORAL | Status: DC | PRN

## 2023-10-15 MED ORDER — DAPAGLIFLOZIN PROPANEDIOL 5 MG PO TABS
5.0000 mg | ORAL_TABLET | Freq: Every day | ORAL | 0 refills | Status: DC
  Filled 2023-10-15: qty 30, 30d supply, fill #0

## 2023-10-15 MED ORDER — LOSARTAN POTASSIUM 50 MG PO TABS
25.0000 mg | ORAL_TABLET | Freq: Every day | ORAL | Status: DC
  Administered 2023-10-15: 25 mg via ORAL
  Filled 2023-10-15: qty 1

## 2023-10-15 MED ORDER — FUROSEMIDE 40 MG PO TABS
40.0000 mg | ORAL_TABLET | Freq: Two times a day (BID) | ORAL | 0 refills | Status: DC
  Filled 2023-10-15: qty 60, 30d supply, fill #0

## 2023-10-15 MED ORDER — ENTRESTO 24-26 MG PO TABS
1.0000 | ORAL_TABLET | Freq: Two times a day (BID) | ORAL | 0 refills | Status: DC
  Filled 2023-10-15: qty 60, 30d supply, fill #0

## 2023-10-15 MED ORDER — PERFLUTREN LIPID MICROSPHERE
1.0000 mL | INTRAVENOUS | Status: AC | PRN
  Administered 2023-10-15: 3 mL via INTRAVENOUS

## 2023-10-15 NOTE — Discharge Instructions (Addendum)
 Cheryl Curry,  You were in the hospital because of acute heart failure. This is possibly related to your uncontrolled high blood pressure. Your medications have been adjusted. Please take your medication as prescribed and follow-up with the heart doctors as an outpatient. Please adhere to a low-sodium and low fluid intake diet as we discussed. I recommend discontinuing your humulin  as this is duplicate to your Lantus .

## 2023-10-15 NOTE — Plan of Care (Signed)

## 2023-10-15 NOTE — Progress Notes (Signed)
 Heart Failure Nurse Navigator Progress Note  PCP: [facility]  Adult And Pediatric PCP-Cardiologist: none Admission Diagnosis: *** Admitted from: ***  Presentation:   Cheryl Curry presented with ***  ECHO/ LVEF: ***  Clinical Course:  Past Medical History:  Diagnosis Date   Diabetes mellitus without complication (HCC)    Hypertension    Pregnancy induced hypertension      Social History   Socioeconomic History   Marital status: Legally Separated    Spouse name: Not on file   Number of children: Not on file   Years of education: Not on file   Highest education level: Not on file  Occupational History   Not on file  Tobacco Use   Smoking status: Never   Smokeless tobacco: Never  Vaping Use   Vaping status: Never Used  Substance and Sexual Activity   Alcohol use: Yes    Comment: occassional   Drug use: No   Sexual activity: Yes    Birth control/protection: Implant  Other Topics Concern   Not on file  Social History Narrative   Lives with 3 children, 20 16 and 5   Social Drivers of Corporate investment banker Strain: Not on File (09/25/2021)   Received from [facility], [facility]    Financial Resource Strain: 0  Food Insecurity: No Food Insecurity (10/14/2023)   Hunger Vital Sign    Worried About Running Out of Food in the Last Year: Never true    Ran Out of Food in the Last Year: Never true  Transportation Needs: No Transportation Needs (10/14/2023)   PRAPARE - Administrator, Civil Service (Medical): No    Lack of Transportation (Non-Medical): No  Physical Activity: Not on File (09/25/2021)   Received from [place], [place]   Physical Activity    Physical Activity: 0  Stress: Not on File (09/25/2021)   Received from [facility], [place]   Stress    Stress: 0  Social Connections: Not on File (02/15/2023)   Received from [facility]   Social Connections    Connectedness: 0    High Risk Criteria for Readmission and/or Poor Patient  Outcomes: Heart failure hospital admissions (last 6 months): ***  No Show rate: *** Difficult social situation: *** Demonstrates medication adherence: *** Primary Language: *** Literacy level: ***  Barriers of Care:   ***  Considerations/Referrals:   Referral made to Heart Failure Pharmacist Stewardship: no Referral made to Heart Failure CSW/NCM TOC: no Referral made to Heart & Vascular TOC clinic: yes, 5/19  Items for Follow-up on DC/TOC: ***   ***

## 2023-10-15 NOTE — Consult Note (Signed)
 CARDIOLOGY CONSULT NOTE  Patient ID: Cheryl Curry MRN: [id number] DOB/AGE: 06-24-1985 38 y.o.  Admit date: 10/14/2023 Primary Physician [facility]  Adult And Pediatric Primary Cardiologist None Chief Complaint  SOB  Requesting  Dr. Duard Getting  HPI: The patient has no past cardiac history.  She does have diabetes and hypertension.  She presented to the emergency room with shortness of breath.  She was seen a couple of months ago with shortness of breath and CT angio demonstrated no pulmonary embolism.  She did have evidence of bilateral pleural effusion and interstitial edema.  Respiratory panel was normal.  Troponin was normal.  She had sinus tachycardia at the urgent care prior to going to the emergency room.  Meds were not changed at discharge and I do not see any referral.  She reports currently that she had SOB in March that got a little better.  However, it returned in the last 3 days.  She been getting increasing dyspnea and finally came to the ER when she was short of breath just taking a bath.  In retrospect she had a little lower extremity swelling.  She had 20 pound weight gain over about a month.  She has been describing PND but not orthopnea.  She has not had any palpitations, presyncope or syncope.  She is not having any chest pressure, neck or arm discomfort.   Echo demonstrated an EF of 20 to 25%.  There is global hypokinesis.  Tricuspid regurgitation is mild to moderate.  BNP is elevated at 902.  Thyroid studies normal.  EKG demonstrates sinus tachycardia 138 but I do not see an EKG yet this admission.  The looks like 1 was ordered.   Past Medical History:  Diagnosis Date   Diabetes mellitus without complication (HCC)    Hypertension    Pregnancy induced hypertension     Past Surgical History:  Procedure Laterality Date   NO PAST SURGERIES      No Known Allergies Medications Prior to Admission  Medication Sig Dispense Refill Last Dose/Taking    lisinopril -hydrochlorothiazide  (ZESTORETIC) 20-25 MG tablet Take 1 tablet by mouth daily.   10/14/2023   Family History  Problem Relation Age of Onset   Hypertension Mother    Hypertension Father     Social History   Socioeconomic History   Marital status: Legally Separated    Spouse name: Not on file   Number of children: Not on file   Years of education: Not on file   Highest education level: Not on file  Occupational History   Not on file  Tobacco Use   Smoking status: Never   Smokeless tobacco: Never  Vaping Use   Vaping status: Never Used  Substance and Sexual Activity   Alcohol use: Yes    Comment: occassional   Drug use: No   Sexual activity: Yes    Birth control/protection: Implant  Other Topics Concern   Not on file  Social History Narrative   ** Merged History Encounter **       Social Drivers of Health   Financial Resource Strain: Not on File (09/25/2021)   Received from [facility], [facility]    Financial Resource Strain: 0  Food Insecurity: No Food Insecurity (10/14/2023)   Hunger Vital Sign    Worried About Running Out of Food in the Last Year: Never true    Ran Out of Food in the Last Year: Never true  Transportation Needs: No Transportation Needs (10/14/2023)  PRAPARE - Administrator, Civil Service (Medical): No    Lack of Transportation (Non-Medical): No  Physical Activity: Not on File (09/25/2021)   Received from [place], [place]   Physical Activity    Physical Activity: 0  Stress: Not on File (09/25/2021)   Received from [facility], [place]   Stress    Stress: 0  Social Connections: Not on File (02/15/2023)   Received from [facility]   Social Connections    Connectedness: 0  Intimate Partner Violence: Not At Risk (10/15/2023)   Humiliation, Afraid, Rape, and Kick questionnaire    Fear of Current or Ex-Partner: No    Emotionally Abused: No    Physically Abused: No    Sexually Abused: No     ROS:    As stated in the HPI and  negative for all other systems.  Physical Exam: Blood pressure (!) 133/101, pulse 90, temperature 98.1 F (36.7 C), temperature source Oral, resp. rate 18, height 5\' 6"  (1.676 m), weight 82.1 kg, SpO2 93%.  GENERAL:  Well appearing HEENT:  Pupils equal round and reactive, fundi not visualized, oral mucosa unremarkable NECK: Positive jugular venous distention 10 cm at 45 degrees, waveform within normal limits, carotid upstroke brisk and symmetric, no bruits, no thyromegaly LYMPHATICS:  No cervical, inguinal adenopathy LUNGS:  Clear to auscultation bilaterally BACK:  No CVA tenderness CHEST:  Unremarkable HEART:  PMI not displaced or sustained,S1 and S2 within normal limits, positive S3, no S4, no clicks, no rubs, no murmurs ABD:  Flat, positive bowel sounds normal in frequency in pitch, no bruits, no rebound, no guarding, no midline pulsatile mass, no hepatomegaly, no splenomegaly EXT:  2 plus pulses throughout, no edema, no cyanosis no clubbing SKIN:  No rashes no nodules NEURO:  Cranial nerves II through XII grossly intact, motor grossly intact throughout PSYCH:  Cognitively intact, oriented to person place and time   Labs: Lab Results  Component Value Date   BUN 16 10/15/2023   Lab Results  Component Value Date   CREATININE 0.80 10/15/2023   Lab Results  Component Value Date   NA 139 10/15/2023   K 3.6 10/15/2023   CL 105 10/15/2023   CO2 24 10/15/2023   No results found for: "TROPONINI" Lab Results  Component Value Date   WBC 6.9 10/15/2023   HGB 13.6 10/15/2023   HCT 40.0 10/15/2023   MCV 94.3 10/15/2023   PLT 186 10/15/2023   No results found for: "CHOL", "HDL", "LDLCALC", "LDLDIRECT", "TRIG", "CHOLHDL" Lab Results  Component Value Date   ALT 19 08/14/2023   AST 24 08/14/2023   ALKPHOS 40 08/14/2023   BILITOT 1.2 08/14/2023      Radiology:   CXR: NAD  EKG:  ST, rate 109, non specific ST T wave changes.  Poor anterior R wave progression.   ASSESSMENT AND  PLAN:   Acute systolic heart failure: Patient has newly diagnosed cardiomyopathy.  Etiology is not clear.  I doubt an ischemic etiology though she could probably have coronary CTA eventually because she does have diabetes.  I suspect a hypertensive etiology as she has had longstanding blood pressure possibly not well-controlled.  She was hypertensive on presentation.  She has not taken her lisinopril  in 48 hours.  She can be started on Entresto 24/26 twice daily.  I also like to use Farxiga.  She can go home with Lasix  and I would start with 40 mg twice daily with 20 mill equivalents of potassium.  She needs to  have follow-up in [facility] clinic next week.  We talked about salt and fluid restriction.  We talked that the physiology and causes of heart failure.  Laboratories have thus far been negative.  Hypertension: This is being managed in the context of treating his CHF   DM:  Per primary team.     Signed: Eilleen Grates 10/15/2023, 2:59 PM

## 2023-10-15 NOTE — Plan of Care (Signed)
  Problem: Education: Goal: Knowledge of General Education information will improve Description: Including pain rating scale, medication(s)/side effects and non-pharmacologic comfort measures Outcome: Progressing   Problem: Health Behavior/Discharge Planning: Goal: Ability to manage health-related needs will improve Outcome: Progressing   Problem: Clinical Measurements: Goal: Ability to maintain clinical measurements within normal limits will improve Outcome: Progressing Goal: Will remain free from infection Outcome: Progressing Goal: Diagnostic test results will improve Outcome: Progressing Goal: Respiratory complications will improve Outcome: Progressing Goal: Cardiovascular complication will be avoided Outcome: Progressing   Problem: Activity: Goal: Risk for activity intolerance will decrease Outcome: Progressing   Problem: Nutrition: Goal: Adequate nutrition will be maintained Outcome: Progressing   Problem: Elimination: Goal: Will not experience complications related to bowel motility Outcome: Progressing Goal: Will not experience complications related to urinary retention Outcome: Progressing   Problem: Pain Managment: Goal: General experience of comfort will improve and/or be controlled Outcome: Progressing   Problem: Safety: Goal: Ability to remain free from injury will improve Outcome: Progressing   Problem: Education: Goal: Ability to describe self-care measures that may prevent or decrease complications (Diabetes Survival Skills Education) will improve Outcome: Progressing Goal: Individualized Educational Video(s) Outcome: Progressing   Problem: Fluid Volume: Goal: Ability to maintain a balanced intake and output will improve Outcome: Progressing   Problem: Health Behavior/Discharge Planning: Goal: Ability to identify and utilize available resources and services will improve Outcome: Progressing Goal: Ability to manage health-related needs will  improve Outcome: Progressing   Problem: Nutritional: Goal: Maintenance of adequate nutrition will improve Outcome: Progressing Goal: Progress toward achieving an optimal weight will improve Outcome: Progressing   Problem: Tissue Perfusion: Goal: Adequacy of tissue perfusion will improve Outcome: Progressing   Problem: Skin Integrity: Goal: Risk for impaired skin integrity will decrease Outcome: Progressing

## 2023-10-15 NOTE — Progress Notes (Signed)
*  PRELIMINARY RESULTS* Echocardiogram 2D Echocardiogram has been performed.  Cheryl Curry 10/15/2023, 9:58 AM

## 2023-10-15 NOTE — Progress Notes (Signed)
 Discharge medications delivered to bedside [facility]

## 2023-10-15 NOTE — Discharge Summary (Signed)
 Physician Discharge Summary   Patient: Cheryl Curry MRN: [id number] DOB: 1986-04-26  Admit date:     10/14/2023  Discharge date: 10/15/23  Discharge Physician: Aneita Keens, MD   PCP: [facility]   Recommendations at discharge:  PCP visit for hospital follow-up Cardiology visit for hospital follow-up  Discharge Diagnoses: Principal Problem:   Acute systolic HF (heart failure) (HCC) Active Problems:   Acute CHF (congestive heart failure) (HCC)  Resolved Problems:   * No resolved hospital problems. *  Hospital Course: [name] is a 38 y.o. female with a history of hypertension, diabetes mellitus type 2.  Patient presented secondary to shortness of breath and found to have evidence of acute heart failure requiring IV lasix . Transthoracic Echocardiogram obtained significant for significantly reduced LVEF of 20-25%. Cardiology consulted. Patient improved to baseline prior to discharge.  Assessment and Plan:  Acute HFrEF New diagnosis. Patient with acute symptoms on admission consisting of dyspnea, orthopnea and bilateral lower extremity edema. No pulmonary edema. Lasix  IV started. Transthoracic Echocardiogram obtained and significant for an LVEF of 20-25% with global hypokinesis with indeterminate diastolic parameters. Cardiology consulted and recommended Entresto, Lasix , Farxiga on discharge. Symptoms were improved prior to discharge.  Primary hypertension Lisinopril  discontinued. Entresto and furosemide  started.  Diabetes mellitus type 2 Uncontrolled with hyperglycemia. Patient discharged on home Lantus , Ozempic and glipizide. Farxiga added for heart failure. Humalin 70/30 discontinued secondary to Lantus  use.   Consultants: Cardiology Procedures performed: Transthoracic Echocardiogram  Disposition: Home Diet recommendation: Cardiac and Carb modified diet   DISCHARGE MEDICATION: Allergies as of 10/15/2023   No Known  Allergies      Medication List     PAUSE taking these medications    HumuLIN  70/30 KwikPen (70-30) 100 UNIT/ML KwikPen Wait to take this until your doctor or other care provider tells you to start again. Generic drug: insulin  isophane & regular human KwikPen Inject 25 Units into the skin 2 (two) times daily.       STOP taking these medications    lisinopril -hydrochlorothiazide  20-12.5 MG tablet Commonly known as: ZESTORETIC   lisinopril -hydrochlorothiazide  20-25 MG tablet Commonly known as: ZESTORETIC       TAKE these medications    Acetaminophen  500 MG capsule Take 500-1,000 mg by mouth every 6 (six) hours as needed for pain (or headaches- Extra Strength TYLENOL  Rapid Release Gels).   dapagliflozin propanediol 5 MG Tabs tablet Commonly known as: FARXIGA Take 1 tablet (5 mg total) by mouth daily before breakfast.   Entresto 24-26 MG Generic drug: sacubitril-valsartan Take 1 tablet by mouth 2 (two) times daily.   furosemide  40 MG tablet Commonly known as: Lasix  Take 1 tablet (40 mg total) by mouth 2 (two) times daily.   glipiZIDE 5 MG tablet Commonly known as: GLUCOTROL Take 10 mg by mouth 2 (two) times daily before a meal.   Lantus  SoloStar 100 UNIT/ML Solostar Pen Generic drug: insulin  glargine Inject 30 Units into the skin at bedtime.   Ozempic (1 MG/DOSE) 2 MG/1.5ML Sopn Generic drug: Semaglutide (1 MG/DOSE) Inject 1 mg into the skin every Monday.   potassium chloride SA 20 MEQ tablet Commonly known as: KLOR-CON M Take 1 tablet (20 mEq total) by mouth daily.        Discharge Exam: BP (!) 135/102 (BP Location: Left Arm)   Pulse (!) 109   Temp 98.6 F (37 C) (Oral)   Resp 18   Ht 5\' 6"  (1.676 m)   Wt 82.1 kg   SpO2 100%  BMI 29.21 kg/m   General exam: Appears calm and comfortable Respiratory system: Clear to auscultation. Respiratory effort normal. Cardiovascular system: S1 & S2 heard, RRR. Gastrointestinal system: Abdomen is  nondistended, soft and nontender. Normal bowel sounds heard. Central nervous system: Alert and oriented. No focal neurological deficits. Musculoskeletal: No edema. No calf tenderness Psychiatry: Judgement and insight appear normal. Mood & affect appropriate.   Condition at discharge: stable  The results of significant diagnostics from this hospitalization (including imaging, microbiology, ancillary and laboratory) are listed below for reference.   Imaging Studies: ECHOCARDIOGRAM COMPLETE Result Date: 10/15/2023    ECHOCARDIOGRAM REPORT   Patient Name:   Cheryl Curry Date of Exam: 10/15/2023 Medical Rec #:  [id number]                 Height:       66.0 in Accession #:    [id number]                Weight:       181.0 lb Date of Birth:  08-07-1985                  BSA:          1.917 m Patient Age:    38 years                  BP:           121/97 mmHg Patient Gender: F                         HR:           104 bpm. Exam Location:  Inpatient Procedure: 2D Echo, Color Doppler and Cardiac Doppler (Both Spectral and Color            Flow Doppler were utilized during procedure). Indications:    R94.31 Abnormal EKG  History:        Patient has no prior history of Echocardiogram examinations.                 CHF; Risk Factors:Diabetes.  Sonographer:    Andrena Bang Referring Phys: [id number] BINAYA DAHAL IMPRESSIONS  1. Left ventricular ejection fraction, by estimation, is 20 to 25%. The left ventricle has severely decreased function. The left ventricle demonstrates global hypokinesis. The left ventricular internal cavity size was moderately dilated. Left ventricular diastolic parameters are indeterminate.  2. Right ventricular systolic function is normal. The right ventricular size is normal.  3. Left atrial size was mildly dilated.  4. The mitral valve is abnormal. Mild mitral valve regurgitation. No evidence of mitral stenosis.  5. Tricuspid valve regurgitation is mild to moderate.  6. The aortic valve is  tricuspid. Aortic valve regurgitation is not visualized. No aortic stenosis is present.  7. The inferior vena cava is normal in size with greater than 50% respiratory variability, suggesting right atrial pressure of 3 mmHg. FINDINGS  Left Ventricle: Left ventricular ejection fraction, by estimation, is 20 to 25%. The left ventricle has severely decreased function. The left ventricle demonstrates global hypokinesis. Definity contrast agent was given IV to delineate the left ventricular endocardial borders. Strain was performed and the global longitudinal strain is indeterminate. The left ventricular internal cavity size was moderately dilated. There is no left ventricular hypertrophy. Left ventricular diastolic parameters are indeterminate. Right Ventricle: The right ventricular size is normal. No increase in right ventricular wall thickness. Right ventricular systolic function is normal. Left Atrium:  Left atrial size was mildly dilated. Right Atrium: Right atrial size was normal in size. Pericardium: There is no evidence of pericardial effusion. Mitral Valve: The mitral valve is abnormal. There is mild thickening of the mitral valve leaflet(s). Mild mitral valve regurgitation. No evidence of mitral valve stenosis. Tricuspid Valve: The tricuspid valve is normal in structure. Tricuspid valve regurgitation is mild to moderate. No evidence of tricuspid stenosis. Aortic Valve: The aortic valve is tricuspid. Aortic valve regurgitation is not visualized. No aortic stenosis is present. Aortic valve mean gradient measures 3.0 mmHg. Aortic valve peak gradient measures 4.8 mmHg. Aortic valve area, by VTI measures 2.06 cm. Pulmonic Valve: The pulmonic valve was normal in structure. Pulmonic valve regurgitation is mild. No evidence of pulmonic stenosis. Aorta: The aortic root is normal in size and structure. Venous: The inferior vena cava is normal in size with greater than 50% respiratory variability, suggesting right atrial  pressure of 3 mmHg. IAS/Shunts: No atrial level shunt detected by color flow Doppler. Additional Comments: 3D was performed not requiring image post processing on an independent workstation and was indeterminate.  LEFT VENTRICLE PLAX 2D LVIDd:         5.60 cm LVIDs:         5.20 cm LV PW:         1.40 cm LV IVS:        0.80 cm LVOT diam:     2.10 cm LV SV:         33 LV SV Index:   17 LVOT Area:     3.46 cm  LV Volumes (MOD) LV vol d, MOD A2C: 232.0 ml LV vol d, MOD A4C: 193.0 ml LV vol s, MOD A2C: 182.0 ml LV vol s, MOD A4C: 174.0 ml LV SV MOD A2C:     50.0 ml LV SV MOD A4C:     193.0 ml LV SV MOD BP:      39.6 ml RIGHT VENTRICLE RV S prime:     11.90 cm/s TAPSE (M-mode): 1.7 cm LEFT ATRIUM              Index LA diam:        3.70 cm  1.93 cm/m LA Vol (A2C):   122.0 ml 63.65 ml/m LA Vol (A4C):   91.0 ml  47.48 ml/m LA Biplane Vol: 109.0 ml 56.87 ml/m  AORTIC VALVE AV Area (Vmax):    2.18 cm AV Area (Vmean):   2.07 cm AV Area (VTI):     2.06 cm AV Vmax:           109.00 cm/s AV Vmean:          75.300 cm/s AV VTI:            0.160 m AV Peak Grad:      4.8 mmHg AV Mean Grad:      3.0 mmHg LVOT Vmax:         68.50 cm/s LVOT Vmean:        45.000 cm/s LVOT VTI:          0.095 m LVOT/AV VTI ratio: 0.59  AORTA Ao Asc diam: 3.50 cm TRICUSPID VALVE TR Peak grad:   36.0 mmHg TR Vmax:        300.00 cm/s  SHUNTS Systemic VTI:  0.10 m Systemic Diam: 2.10 cm Janelle Mediate MD Electronically signed by Janelle Mediate MD Signature Date/Time: 10/15/2023/10:42:49 AM    Final    DG Chest 2 View Result Date: 10/14/2023  CLINICAL DATA:  Shortness of breath and cough for 3 days. EXAM: CHEST - 2 VIEW COMPARISON:  September 09, 2023. FINDINGS: Stable cardiomediastinal silhouette. Both lungs are clear. The visualized skeletal structures are unremarkable. IMPRESSION: No active cardiopulmonary disease. Electronically Signed   By: Rosalene Colon M.D.   On: 10/14/2023 18:11    Microbiology: Results for orders placed or performed during the  hospital encounter of 08/14/23  Resp panel by RT-PCR (RSV, Flu A&B, Covid) Anterior Nasal Swab     Status: None   Collection Time: 08/14/23  3:27 PM   Specimen: Anterior Nasal Swab  Result Value Ref Range Status   SARS Coronavirus 2 by RT PCR NEGATIVE NEGATIVE Final   Influenza A by PCR NEGATIVE NEGATIVE Final   Influenza B by PCR NEGATIVE NEGATIVE Final    Comment: (NOTE) The Xpert Xpress SARS-CoV-2/FLU/RSV plus assay is intended as an aid in the diagnosis of influenza from Nasopharyngeal swab specimens and should not be used as a sole basis for treatment. Nasal washings and aspirates are unacceptable for Xpert Xpress SARS-CoV-2/FLU/RSV testing.  Fact Sheet for Patients: BloggerCourse.com  Fact Sheet for Healthcare Providers: SeriousBroker.it  This test is not yet approved or cleared by the [place]  FDA and has been authorized for detection and/or diagnosis of SARS-CoV-2 by FDA under an Emergency Use Authorization (EUA). This EUA will remain in effect (meaning this test can be used) for the duration of the COVID-19 declaration under Section 564(b)(1) of the Act, 21 U.S.C. section 360bbb-3(b)(1), unless the authorization is terminated or revoked.     Resp Syncytial Virus by PCR NEGATIVE NEGATIVE Final    Comment: (NOTE) Fact Sheet for Patients: BloggerCourse.com  Fact Sheet for Healthcare Providers: SeriousBroker.it  This test is not yet approved or cleared by the [place]  FDA and has been authorized for detection and/or diagnosis of SARS-CoV-2 by FDA under an Emergency Use Authorization (EUA). This EUA will remain in effect (meaning this test can be used) for the duration of the COVID-19 declaration under Section 564(b)(1) of the Act, 21 U.S.C. section 360bbb-3(b)(1), unless the authorization is terminated or revoked.  Performed at [facility], [address] [place], [place] [address]   Blood culture (routine x 2)     Status: None   Collection Time: 08/14/23  9:10 PM   Specimen: BLOOD  Result Value Ref Range Status   Specimen Description BLOOD SITE NOT SPECIFIED  Final   Special Requests   Final    BOTTLES DRAWN AEROBIC AND ANAEROBIC Blood Culture adequate volume   Culture   Final    NO GROWTH 5 DAYS Performed at [facility], [address] [place], [place] [address]    Report Status 08/19/2023 FINAL  Final    Labs: CBC: Recent Labs  Lab 10/14/23 1713 10/15/23 0621  WBC 6.8 6.9  NEUTROABS 3.3  --   HGB 13.8 13.6  HCT 41.3 40.0  MCV 93.9 94.3  PLT 196 186   Basic Metabolic Panel: Recent Labs  Lab 10/14/23 1713 10/15/23 0621  NA 135 139  K 3.9 3.6  CL 103 105  CO2 22 24  GLUCOSE 217* 154*  BUN 15 16  CREATININE 0.87 0.80  CALCIUM 8.7* 8.8*    CBG: Recent Labs  Lab 10/14/23 2201 10/15/23 0749 10/15/23 1205  GLUCAP 301* 161* 193*    Discharge time spent: 35 minutes.  Signed: Aneita Keens, MD [facility] 10/15/2023

## 2023-10-15 NOTE — Progress Notes (Signed)
SATURATION QUALIFICATIONS: (This note is used to comply with regulatory documentation for home oxygen)  Patient Saturations on Room Air at Rest = 100%  Patient Saturations on Room Air while Ambulating = 100%  Patient Saturations on 0 Liters of oxygen while Ambulating = 100%  Please briefly explain why patient needs home oxygen: 

## 2023-10-17 NOTE — Hospital Course (Signed)
 Cheryl Curry is a 38 y.o. female with a history of hypertension, diabetes mellitus type 2.  Patient presented secondary to shortness of breath and found to have evidence of acute heart failure requiring IV lasix . Transthoracic Echocardiogram obtained significant for significantly reduced LVEF of 20-25%. Cardiology consulted. Patient improved to baseline prior to discharge.

## 2023-10-25 ENCOUNTER — Ambulatory Visit (HOSPITAL_COMMUNITY)
Admit: 2023-10-25 | Discharge: 2023-10-25 | Disposition: A | Discharge status: Home / Self Care | Payer: MEDICAID | Source: Ambulatory Visit | Attending: Adult Health | Admitting: Adult Health

## 2023-10-25 ENCOUNTER — Encounter (HOSPITAL_COMMUNITY): Payer: Self-pay

## 2023-10-25 ENCOUNTER — Telehealth (HOSPITAL_COMMUNITY): Payer: Self-pay

## 2023-10-25 ENCOUNTER — Other Ambulatory Visit (HOSPITAL_COMMUNITY): Payer: Self-pay

## 2023-10-25 ENCOUNTER — Ambulatory Visit (HOSPITAL_COMMUNITY): Payer: Self-pay | Admitting: Adult Health

## 2023-10-25 VITALS — BP 137/107 | HR 114 | Ht 66.0 in | Wt 182.4 lb

## 2023-10-25 DIAGNOSIS — Z79899 Other long term (current) drug therapy: Secondary | ICD-10-CM | POA: Diagnosis not present

## 2023-10-25 DIAGNOSIS — Z7985 Long-term (current) use of injectable non-insulin antidiabetic drugs: Secondary | ICD-10-CM | POA: Diagnosis not present

## 2023-10-25 DIAGNOSIS — R000 Tachycardia, unspecified: Secondary | ICD-10-CM | POA: Diagnosis not present

## 2023-10-25 DIAGNOSIS — R0602 Shortness of breath: Secondary | ICD-10-CM | POA: Diagnosis not present

## 2023-10-25 DIAGNOSIS — Z7984 Long term (current) use of oral hypoglycemic drugs: Secondary | ICD-10-CM | POA: Insufficient documentation

## 2023-10-25 DIAGNOSIS — I10 Essential (primary) hypertension: Secondary | ICD-10-CM | POA: Diagnosis not present

## 2023-10-25 DIAGNOSIS — I5022 Chronic systolic (congestive) heart failure: Secondary | ICD-10-CM | POA: Insufficient documentation

## 2023-10-25 DIAGNOSIS — Z794 Long term (current) use of insulin: Secondary | ICD-10-CM | POA: Diagnosis not present

## 2023-10-25 DIAGNOSIS — E119 Type 2 diabetes mellitus without complications: Secondary | ICD-10-CM | POA: Diagnosis not present

## 2023-10-25 DIAGNOSIS — I5021 Acute systolic (congestive) heart failure: Secondary | ICD-10-CM | POA: Diagnosis not present

## 2023-10-25 DIAGNOSIS — I110 Hypertensive heart disease with heart failure: Secondary | ICD-10-CM | POA: Diagnosis not present

## 2023-10-25 DIAGNOSIS — Z793 Long term (current) use of hormonal contraceptives: Secondary | ICD-10-CM | POA: Diagnosis not present

## 2023-10-25 LAB — BASIC METABOLIC PANEL WITH GFR
Anion gap: 7 (ref 5–15)
BUN: 11 mg/dL (ref 6–20)
CO2: 24 mmol/L (ref 22–32)
Calcium: 8.8 mg/dL — ABNORMAL LOW (ref 8.9–10.3)
Chloride: 106 mmol/L (ref 98–111)
Creatinine, Ser: 1.03 mg/dL — ABNORMAL HIGH (ref 0.44–1.00)
GFR, Estimated: >60 mL/min (ref >60)
Glucose, Bld: 160 mg/dL — ABNORMAL HIGH (ref 70–99)
Potassium: 4.1 mmol/L (ref 3.5–5.1)
Sodium: 137 mmol/L (ref 135–145)

## 2023-10-25 LAB — BRAIN NATRIURETIC PEPTIDE: B Natriuretic Peptide: 899.3 pg/mL — ABNORMAL HIGH (ref 0.0–100.0)

## 2023-10-25 MED ORDER — DAPAGLIFLOZIN PROPANEDIOL 5 MG PO TABS: 5.0000 mg | ORAL_TABLET | Freq: Every day | ORAL | 3 refills | Status: DC

## 2023-10-25 MED ORDER — DAPAGLIFLOZIN PROPANEDIOL 10 MG PO TABS: 10.0000 mg | ORAL_TABLET | Freq: Every day | ORAL | 3 refills | Status: DC

## 2023-10-25 MED ORDER — SPIRONOLACTONE 25 MG PO TABS: 12.5000 mg | ORAL_TABLET | Freq: Every day | ORAL | 3 refills | Status: DC

## 2023-10-25 MED ORDER — DIGOXIN 125 MCG PO TABS: 0.1250 mg | ORAL_TABLET | Freq: Every day | ORAL | 3 refills | Status: DC

## 2023-10-25 MED ORDER — FUROSEMIDE 40 MG PO TABS: 40.0000 mg | ORAL_TABLET | Freq: Every day | ORAL | 3 refills | Status: DC

## 2023-10-25 NOTE — Progress Notes (Signed)
 HEART & VASCULAR TRANSITION OF CARE CONSULT NOTE     Referring Physician: Dr Jenney Modest [facility]  Cardiology: Dr Lavonne Prairie.   Chief Complaint: Heart Failure   HPI: Referred to clinic by Dr Jenney Modest  for heart failure consultation.   Cheryl Curry is a 38 y.o. female with history of HFrEF, HTN, and DMII. She has not family history of coronary disease. She does not smoke.  She has 3 children 19,16, and 5. After her 3rd pregnancy she remained hypertensive and was placed on hypertensive medications, labetalol  and procardia .    She was seen at [facility] 3/8 with shortness of breath. ? CXR  infection. Discharged the same day. She had  repeat CXR in April which showed resolution of opacity. The  beginning May  she noticed her heart racing was beating fast and she was short of breat with exertion again.   Admitted 10/14/23 Acute HFrEF and HTN urgency Echo EF 20-25%. Given dose of IV lasix . Started on entresto  and farxiga . Discharged on 2/4 GDMT + lasix  40 mg daily. Discharged 10/15/2023.  She was not given farxiga  at discharge and unable to fill at her pharmacy.   Overall feeling fine. SOB with exertion and occasionally short of breat at rest.  Denies PND/Orthopnea. Appetite ok. No fever or chills. She does not have working scale.  Taking all medications. Manages [employer], currently out of work for 8 weeks. She has 3 children. She is on on birth control.   Cardiac Testing  5/8 Echo Ef 20-25% RV normal.   No ischemic work up.    Past Medical History:  Diagnosis Date   Diabetes mellitus without complication (HCC)    Hypertension    Pregnancy induced hypertension     Current Outpatient Medications  Medication Sig Dispense Refill   Acetaminophen  500 MG capsule Take 500-1,000 mg by mouth every 6 (six) hours as needed for pain (or headaches- Extra Strength TYLENOL  Rapid Release Gels).     furosemide  (LASIX ) 40 MG tablet Take 1 tablet (40 mg total) by mouth 2  (two) times daily. 60 tablet 0   glipiZIDE (GLUCOTROL) 5 MG tablet Take 10 mg by mouth 2 (two) times daily before a meal.     [Paused] HUMULIN  70/30 KWIKPEN (70-30) 100 UNIT/ML KwikPen Inject 25 Units into the skin 2 (two) times daily.     LANTUS  SOLOSTAR 100 UNIT/ML Solostar Pen Inject 30 Units into the skin at bedtime.     OZEMPIC, 1 MG/DOSE, 2 MG/1.5ML SOPN Inject 1 mg into the skin every Monday.     potassium chloride  SA (KLOR-CON  M) 20 MEQ tablet Take 1 tablet (20 mEq total) by mouth daily. 30 tablet 0   sacubitril -valsartan  (ENTRESTO ) 24-26 MG Take 1 tablet by mouth 2 (two) times daily. 60 tablet 0   dapagliflozin  propanediol (FARXIGA ) 5 MG TABS tablet Take 1 tablet (5 mg total) by mouth daily before breakfast. (Patient not taking: Reported on 10/25/2023) 30 tablet 0   No current facility-administered medications for this encounter.    No Known Allergies    Social History   Socioeconomic History   Marital status: Legally Separated    Spouse name: Not on file   Number of children: Not on file   Years of education: Not on file   Highest education level: Not on file  Occupational History   Not on file  Tobacco Use   Smoking status: Never   Smokeless tobacco: Never  Vaping Use  Vaping status: Never Used  Substance and Sexual Activity   Alcohol use: Yes    Comment: occassional   Drug use: No   Sexual activity: Yes    Birth control/protection: Implant  Other Topics Concern   Not on file  Social History Narrative   Lives with 3 children, 20 16 and 5   Social Drivers of Corporate investment banker Strain: Not on File (09/25/2021)   Received from [facility]    Financial Resource Strain: 0  Food Insecurity: No Food Insecurity (10/14/2023)   Hunger Vital Sign    Worried About Running Out of Food in the Last Year: Never true    Ran Out of Food in the Last Year: Never true  Transportation Needs: No Transportation Needs (10/14/2023)   PRAPARE -  Administrator, Civil Service (Medical): No    Lack of Transportation (Non-Medical): No  Physical Activity: Not on File (09/25/2021)   Received from [place], [place]   Physical Activity    Physical Activity: 0  Stress: Not on File (09/25/2021)   Received from [facility], [place]   Stress    Stress: 0  Social Connections: Not on File (02/15/2023)   Received from [facility]   Social Connections    Connectedness: 0  Intimate Partner Violence: Not At Risk (10/15/2023)   Humiliation, Afraid, Rape, and Kick questionnaire    Fear of Current or Ex-Partner: No    Emotionally Abused: No    Physically Abused: No    Sexually Abused: No      Family History  Problem Relation Age of Onset   Hypertension Mother    Hypertension Father    Stroke Father    Hypertension Brother     Vitals:   10/25/23 0926  BP: (!) 137/107  Pulse: (!) 114  SpO2: 100%  Weight: 82.7 kg (182 lb 6.4 oz)  Height: 5\' 6"  (1.676 m)    PHYSICAL EXAM: General:   No resp difficulty Neck: supple. no JVD.  Cor: PMI nondisplaced. Tachy Regular rate & rhythm. No rubs, gallops or murmurs. Lungs: clear Abdomen: soft, nontender, nondistended.  Extremities: no cyanosis, clubbing, rash, edema Neuro: alert & oriented x3  ECG: Sinus Tach 111 bpm QRS 76ms.    ASSESSMENT & PLAN: 1. Chronic HFrEF Echo 10/2023 EF 20-25% RV normal. No ischemic work up.  Etiology unclear. Possible HTN  +/- viral. Set up Cardiac MRI to further assess. We discussed.   NYHA III. Appears euvolemic. Cut back lasix  to 40 mg daily  Add digoxin  0.125 mg daily  GDMT  BB- Hold off  Ace/ARB/ARNI- Continue 24-26 mg twice a day  MRA- Add sprio 12.5 mg daily  SGLT2i- Start farxiga  10 mg daily. Pharmacy team working on authorization.  Check BMET today and in 2 weeks.  Plan to optimize HF meds ~ 3 -4 months then repeat ECHO.  We discussed if she develops worsening shortness of breath with exertion or at rest she should go to the ED and call our office. Would  need cath if she decompensate again.   2. HTN  Elevated. Adding spiro today.  Continue entresto  24-26 mg twice a day.  May need to use Bidil down the road.   3. Sinus Tach TSH reviewed from 10/15/23, WNL Adding digoxin  as above.    Referred to HFSW (PCP, Medications, Transportation, ETOH Abuse, Drug Abuse, Insurance, Financial ): No Refer to Pharmacy: Yes  Refer to Home Health: No Refer to Advanced  [facility] Failure Clinic: Yes Se Sabharwal.  Refer to General Cardiology: Shared with Dr Lavonne Prairie  Follow up in 2 weeks with phamacy and 3 week with Dr Bruce Caper.   Cheryl Bars NP-C  9:29 AM

## 2023-10-25 NOTE — Telephone Encounter (Signed)
 Advanced Heart Failure Patient Advocate Encounter  Prior authorization for Farxiga  has been submitted and approved. Test billing returns $0 for 90 day supply.  Key: [id number] Effective: 10/25/2023 to 10/24/2024  Kennis Peacock, CPhT Rx Patient Advocate Phone: [phone]

## 2023-10-25 NOTE — Patient Instructions (Addendum)
 Medication Changes:  START: DIGOXIN  0.125MG  ONCE DAILY   START: SPIRONOLACTONE  12.5MG  ONCE DAILY   DECREASE LASIX  (FUROSEMIDE ) TO 40MG  ONCE DAILY   START: FARXIGA  10MG  ONCE DAILY- free trial card sent to pharmacy   Lab Work:  Labs done today, your results will be available in MyChart, we will contact you for abnormal readings.  Testing/Procedures:  SCHEDULING WILL CALL TO GET THIS SCHEDULED ONCE APPROVED WITH INSURANCE  [facility] [address] [place], [place] [address] Please take advantage of the free valet parking available at the [facility] (Entrance C).  Proceed to the [facility] Radiology Department (First Floor) for check-in.   Magnetic resonance imaging (MRI) is a painless test that produces images of the inside of the body without using Xrays.  During an MRI, strong magnets and radio waves work together in a Data processing manager to form detailed images.   MRI images may provide more details about a medical condition than X-rays, CT scans, and ultrasounds can provide.  You may be given earphones to listen for instructions.  You may eat a light breakfast and take medications as ordered with the exception of furosemide , hydrochlorothiazide , chlorthalidone or spironolactone  (or any other fluid pill). If you are undergoing a stress MRI, please avoid stimulants for 12 hr prior to test. (I.e. Caffeine , nicotine, chocolate, or antihistamine medications)  If your provider has ordered anti-anxiety medications for this test, then you will need a driver.  An IV will be inserted into one of your veins. Contrast material will be injected into your IV. It will leave your body through your urine within a day. You may be told to drink plenty of fluids to help flush the contrast material out of your system.  You will be asked to remove all metal, including: Watch, jewelry, and other metal objects including hearing aids, hair pieces and dentures. Also wearable  glucose monitoring systems (ie. Freestyle Libre and Omnipods) (Braces and fillings normally are not a problem.)   TEST WILL TAKE APPROXIMATELY 1 HOUR  PLEASE NOTIFY SCHEDULING AT LEAST 24 HOURS IN ADVANCE IF YOU ARE UNABLE TO KEEP YOUR APPOINTMENT. [phone]  For more information and frequently asked questions, please visit our website : [email or web address]  Please call the Cardiac Imaging Nurse Navigators with any questions/concerns. [phone] Office   Special Instructions // Education:  SCALE GIVEN TODAY   Follow-Up in: 2 WEEKS WITH PHARMACY AS SCHEDULED   THEN AGAIN IN 3 WEEKS WITH DR. Bruce Caper   At the [facility], you and your health needs are our priority. We have a designated team specialized in the treatment of Heart Failure. This Care Team includes your primary Heart Failure Specialized Cardiologist (physician), Advanced Practice Providers (APPs- Physician Assistants and Nurse Practitioners), and Pharmacist who all work together to provide you with the care you need, when you need it.   You may see any of the following providers on your designated Care Team at your next follow up:  Dr. Jules Oar Dr. Peder Bourdon Dr. Alwin Baars Dr. Judyth Nunnery Nieves Bars, NP Ruddy Corral, [place] [facility] [place], [place] Dennise Fitz, NP [place] Lee, NP Luster Salters, PharmD   Please be sure to bring in all your medications bottles to every appointment.   Need to Contact Us :  If you have any questions or concerns before your next appointment please send us  a message through [place] or call our office at [phone].    TO LEAVE A MESSAGE FOR THE NURSE  SELECT OPTION 2, PLEASE LEAVE A MESSAGE INCLUDING: YOUR NAME DATE OF BIRTH CALL BACK NUMBER REASON FOR CALL**this is important as we prioritize the call backs  YOU WILL RECEIVE A CALL BACK THE SAME DAY AS LONG AS YOU CALL BEFORE 4:00 PM

## 2023-10-26 ENCOUNTER — Other Ambulatory Visit: Payer: Self-pay | Admitting: *Deleted

## 2023-10-26 DIAGNOSIS — I739 Peripheral vascular disease, unspecified: Secondary | ICD-10-CM

## 2023-11-03 ENCOUNTER — Encounter: Payer: Self-pay | Admitting: Vascular Surgery

## 2023-11-03 ENCOUNTER — Ambulatory Visit (HOSPITAL_COMMUNITY)
Admission: RE | Admit: 2023-11-03 | Discharge: 2023-11-03 | Disposition: A | Discharge status: Home / Self Care | Payer: MEDICAID | Source: Ambulatory Visit | Attending: Vascular Surgery | Admitting: Vascular Surgery

## 2023-11-03 ENCOUNTER — Ambulatory Visit (INDEPENDENT_AMBULATORY_CARE_PROVIDER_SITE_OTHER): Payer: MEDICAID | Admitting: Vascular Surgery

## 2023-11-03 VITALS — BP 130/88 | HR 82 | Temp 98.2°F | Ht 66.0 in | Wt 188.0 lb

## 2023-11-03 DIAGNOSIS — I739 Peripheral vascular disease, unspecified: Secondary | ICD-10-CM

## 2023-11-03 DIAGNOSIS — I7300 Raynaud's syndrome without gangrene: Secondary | ICD-10-CM

## 2023-11-03 LAB — VAS US ABI WITH/WO TBI
Left ABI: 1.14
Right ABI: 1.11

## 2023-11-03 NOTE — Progress Notes (Signed)
 Patient ID: Cheryl Curry, female   DOB: 06-02-1986, 38 y.o.   MRN: [id number]  Reason for Consult: New Patient (Initial Visit)   Referred by Luba Running, FNP  Subjective:     HPI:  Cheryl Curry is a 38 y.o. female with history of diabetes and hypertension as well as reduced ejection fraction leading to shortness of breath.  She is here today with progressive feeling of cold in both of her feet particularly when she does not have shoes or socks on.  She has not had any tissue loss or ulceration has never had any lower extremity procedures.  She states that this does hurt but that she is able to control the symptoms currently by wearing socks even to bed.  She does not know of any family history of similar problems.  Past Medical History:  Diagnosis Date   Diabetes mellitus without complication (HCC)    Hypertension    Pregnancy induced hypertension    Family History  Problem Relation Age of Onset   Hypertension Mother    Hypertension Father    Stroke Father    Hypertension Brother    Past Surgical History:  Procedure Laterality Date   NO PAST SURGERIES      Short Social History:  Social History   Tobacco Use   Smoking status: Never   Smokeless tobacco: Never  Substance Use Topics   Alcohol use: Yes    Comment: occassional    No Known Allergies  Current Outpatient Medications  Medication Sig Dispense Refill   Acetaminophen  500 MG capsule Take 500-1,000 mg by mouth every 6 (six) hours as needed for pain (or headaches- Extra Strength TYLENOL  Rapid Release Gels).     dapagliflozin  propanediol (FARXIGA ) 10 MG TABS tablet Take 1 tablet (10 mg total) by mouth daily before breakfast. 30 tablet 3   digoxin  (LANOXIN ) 0.125 MG tablet Take 1 tablet (0.125 mg total) by mouth daily. 30 tablet 3   furosemide  (LASIX ) 40 MG tablet Take 1 tablet (40 mg total) by mouth daily. 30 tablet 3   glipiZIDE (GLUCOTROL) 5 MG tablet Take 10 mg by mouth 2 (two)  times daily before a meal.     [Paused] HUMULIN  70/30 KWIKPEN (70-30) 100 UNIT/ML KwikPen Inject 25 Units into the skin 2 (two) times daily.     LANTUS  SOLOSTAR 100 UNIT/ML Solostar Pen Inject 30 Units into the skin at bedtime.     OZEMPIC, 1 MG/DOSE, 2 MG/1.5ML SOPN Inject 1 mg into the skin every Monday.     potassium chloride  SA (KLOR-CON  M) 20 MEQ tablet Take 1 tablet (20 mEq total) by mouth daily. 30 tablet 0   sacubitril -valsartan  (ENTRESTO ) 24-26 MG Take 1 tablet by mouth 2 (two) times daily. 60 tablet 0   spironolactone  (ALDACTONE ) 25 MG tablet Take 0.5 tablets (12.5 mg total) by mouth daily. 45 tablet 3   No current facility-administered medications for this visit.    Review of Systems  Constitutional:  Constitutional negative. HENT: HENT negative.  Eyes: Eyes negative.  Respiratory: Positive for shortness of breath.  Cardiovascular: Cardiovascular negative.  GI: Gastrointestinal negative.  Musculoskeletal:       Cold feet  Skin: Skin negative.  Neurological: Neurological negative. Hematologic: Hematologic/lymphatic negative.  Psychiatric: Psychiatric negative.        Objective:  Objective   Vitals:   11/03/23 1126  BP: 130/88  Pulse: 82  Temp: 98.2 F (36.8 C)  Weight: 188 lb (85.3 kg)  Height: 5\' 6"  (1.676  m)   Body mass index is 30.34 kg/m.  Physical Exam HENT:     Head: Normocephalic.     Nose: Nose normal.     Mouth/Throat:     Mouth: Mucous membranes are moist.  Eyes:     Pupils: Pupils are equal, round, and reactive to light.  Cardiovascular:     Pulses:          Radial pulses are 2+ on the right side and 2+ on the left side.       Popliteal pulses are 2+ on the right side and 2+ on the left side.       Dorsalis pedis pulses are 2+ on the right side and 2+ on the left side.  Pulmonary:     Effort: Pulmonary effort is normal.  Abdominal:     General: Abdomen is flat.     Palpations: Abdomen is soft.  Musculoskeletal:        General: Normal  range of motion.     Right lower leg: No edema.     Left lower leg: No edema.  Skin:    General: Skin is warm.     Capillary Refill: Capillary refill takes less than 2 seconds.  Neurological:     General: No focal deficit present.     Mental Status: She is alert.  Psychiatric:        Mood and Affect: Mood normal.        Thought Content: Thought content normal.        Judgment: Judgment normal.     Data: ABI Findings:  +---------+------------------+-----+---------+--------+  Right   Rt Pressure (mmHg)IndexWaveform Comment   +---------+------------------+-----+---------+--------+  Brachial 121                                       +---------+------------------+-----+---------+--------+  PTA     140               1.11 triphasic          +---------+------------------+-----+---------+--------+  DP      138               1.10 triphasic          +---------+------------------+-----+---------+--------+  Great Toe117               0.93                    +---------+------------------+-----+---------+--------+   +---------+------------------+-----+---------+-------+  Left    Lt Pressure (mmHg)IndexWaveform Comment  +---------+------------------+-----+---------+-------+  Brachial 126                                      +---------+------------------+-----+---------+-------+  PTA     133               1.06 triphasic         +---------+------------------+-----+---------+-------+  DP      144               1.14 triphasic         +---------+------------------+-----+---------+-------+  Great Toe123               0.98                   +---------+------------------+-----+---------+-------+   +-------+-----------+-----------+------------+------------+  ABI/TBIToday's ABIToday's TBIPrevious ABIPrevious TBI  +-------+-----------+-----------+------------+------------+  Right 1.11       0.93                                  +-------+-----------+-----------+------------+------------+  Left  1.14       0.98                                 +-------+-----------+-----------+------------+------------+         Summary:  Right: Resting right ankle-brachial index is within normal range. The  right toe-brachial index is normal.   Left: Resting left ankle-brachial index is within normal range. The left  toe-brachial index is normal.      Assessment/Plan:    38 year old female here with concern of bilateral lower extremity cold feet exacerbated by exposure to cold consistent with Raynaud's type phenomenon but without any severe symptoms at this time.  I recommended keeping her feet warmer continuing to stay active and she can see me on an as-needed basis.     Adine Hoof MD [facility]

## 2023-11-07 NOTE — Progress Notes (Incomplete)
 ***In Progress***    Advanced Heart Failure Clinic Note   Referring Physician: Dr Jenney Modest [facility]  Cardiology: Dr Lavonne Prairie.    HPI: Referred to clinic by Dr Jenney Modest  for heart failure consultation.    Cheryl Curry is a 38 y.o. female with history of HFrEF, HTN, and T2DM. She has no family history of coronary disease. She does not smoke. She has 3 children. After her 3rd pregnancy she remained hypertensive and was placed on hypertensive medications, labetalol  and diltiazem.     She was seen at [facility] ED 08/14/23 with shortness of breath. Discharged the same day. She had repeat CXR in 09/2023 which showed resolution of opacity. At the beginning of 10/2023, she noticed her heart was racing and she was short of breat with exertion again.    Admitted 10/14/23 with acute HFrEF and HTN urgency. Echo EF 20-25%, normal RV function. Given dose of IV furosemide . Started on Entresto  and Farxiga . Discharged on GDMT + furosemide  40 mg daily.  She was not given farxiga  at discharge and unable to fill at her pharmacy due to cost.    She was seen for follow-up on 10/25/23 in AHF Clinic. She reported feeling fine with no symptoms. She reported taking all of her medications.   Today he returns to HF clinic for pharmacist medication titration. At last visit with NP she was stared on spironolactone  12.5 mg daily, digoxin  0.125 mg daily, Farxiga  10 mg daily, and furosemide  was decreased to 40 mg daily.   Shortness of breath/dyspnea on exertion? {YES NO:22349}  Orthopnea/PND? {YES NO:22349} Edema? {YES NO:22349} Lightheadedness/dizziness? {YES NO:22349} Daily weights at home? {YES NO:22349} Blood pressure/heart rate monitoring at home? {YES [id number] Following low-sodium/fluid-restricted diet? {YES NO:22349}  HF Medications: Entresto  24/26 mg BID Spironolactone  12.5 mg daily ***not picked up*** Farxiga  10 mg daily ***not picked up*** Digoxin  0.125 mg daily ***not picked  up*** Furosemide  40 mg daily Other: potassium chloride  ER 20 mEq daily  Has the patient been experiencing any side effects to the medications prescribed?  {YES NO:22349}  Does the patient have any problems obtaining medications due to transportation or finances?   Insurance: [place] Medicaid; ***  Understanding of regimen: {excellent/good/fair/poor:19665} Understanding of indications: {excellent/good/fair/poor:19665} Potential of compliance: {excellent/good/fair/poor:19665} Patient understands to avoid NSAIDs. Patient understands to avoid decongestants.    Pertinent Lab Values: 10/25/23: Serum creatinine 1.03 mg/dL, BUN 11 mg/dL, Potassium 4.1 mmol/L, Sodium 137 mmol/L, BNP 899.3 pg/mL  Vital Signs: Weight: *** (last clinic weight: 182 lbs) Blood pressure: *** (last BP 137/107 mmHg) Heart rate: *** (last HR 114 bpm)  *** *** *** ASSESSMENT & PLAN: 1. Chronic HFrEF Echo 10/2023 EF 20-25% RV normal. No ischemic work up.  Etiology unclear. Possible HTN  +/- viral.  Previously discussed cardiac MRI to further assess.   NYHA III***. Appears euvolemic***.  *** Continue furosemide  40 mg daily + potassium chloride  ER 20 mEq daily ***BB-Hold off  ***RAASI- Continue Entresto  24/26 mg twice a day  ***MRA- Add sprionolactone 12.5 mg daily  ***SGLT2I- Start farxiga  10 mg daily.  ***Add digoxin  0.125 mg daily  ***Check BMET today and in 2 weeks.  Plan to optimize HF meds then repeat ECHO in 3-4 months   2. HTN  ***Elevated. Adding spiro today ***Continue Entresto  24-26 mg BID   3. Sinus Tach TSH reviewed from 10/15/23, WNL ***Adding digoxin  as above.     Follow up in 1 week with Dr. Candy Champ, PharmD PGY2 Cardiology Pharmacy Resident  Luster Salters, PharmD, BCPS, BCCP, CPP Heart Failure Clinic Pharmacist [phone]

## 2023-11-08 ENCOUNTER — Inpatient Hospital Stay (HOSPITAL_COMMUNITY)
Admission: RE | Admit: 2023-11-08 | Discharge: 2023-11-08 | Disposition: A | Discharge status: Home / Self Care | Payer: MEDICAID | Source: Ambulatory Visit

## 2023-11-08 ENCOUNTER — Encounter (HOSPITAL_COMMUNITY): Payer: Self-pay

## 2023-11-08 ENCOUNTER — Other Ambulatory Visit (HOSPITAL_COMMUNITY): Payer: Self-pay

## 2023-11-14 NOTE — Progress Notes (Signed)
   ADVANCED HEART FAILURE CLINIC NOTE  Referring Physician: [facility]  Primary Care: [facility] Primary Cardiologist:  CC: Chronic systolic heart failure  HPI: Cheryl Curry is a 38 y.o. 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  She was admitted in May 2025 for acute systolic heart failure and hypertensive urgency with echocardiogram demonstrating EF of 20 to 25%.  Patient was started on Entresto , Farxiga , low-dose diuretics and discharged home.  Since that time she has been seen in transition of care clinic where a cardiac MRI was ordered and meds were further uptitrated.  Ms. Cheryl Curry has 3 kids at home.  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  She reports that she is very busy at work and currently taking time off due to recently diagnosed heart failure.  From a functional standpoint, she can perform all ADLs and to complete grocery trips however does become very fatigued afterwards.  Overall her functional status appears to be NYHA III.  She has no orthopnea, PND and lower extremity edema has resolved.  Compliant with all medications.  No history of tobacco or drug use.  Family hx: significant hx of HTN; no heart disease.   PHYSICAL EXAM: Vitals:   11/15/23 1416  BP: 108/84  Pulse: (!) 107  SpO2: 99%   GENERAL: NAD Lungs-CTA CARDIAC:  JVP: 7 cm          Normal rate with regular rhythm.  No murmur.  Pulses 2+.  No edema.  ABDOMEN: Soft, non-tender, non-distended.  EXTREMITIES: Warm and well perfused.  NEUROLOGIC: No obvious FND   DATA REVIEW  ECG: 10/25/2023: Sinus tachycardia with poor R wave progression as per my personal interpretation  ECHO: 10/15/2023: EF 20 to 25%, normal RV function as per my personal interpretation   ASSESSMENT & PLAN:  Chronic systolic heart failure Etiology of HF:  Nonischemic cardiomyopathy.  No significant family history of heart failure.  She does have a history of hypertension dating back to her teenage years.  Cardiac MRI scheduled for July.  Will pursue additional testing including heart catheterization if LV function does not improve or MRI demonstrates any other disease processes. NYHA class / AHA Stage: NYHA III Volume status & Diuretics: Euvolemic, Lasix  40 mg daily Vasodilators:Entresto  24/26mg  BID Beta-Blocker:toprol 12.5mg  daily, digoxin  125 mcg daily.  Repeat dig level today MRA:spironolactone  12.5mg  daily Cardiometabolic:Farxiga  10mg  daily Devices therapies & Valvulopathies: Not currently indicated, uptitrating GDMT. Advanced therapies: Currently indicated  2.  Type 2 diabetes - Followed by her PCP - Repeat A1c today  3.  Hypertension - Reports long history of high blood pressure. - BMP/BNP today.  Well-controlled.  I spent 31 minutes caring for this patient today including face to face time, ordering and reviewing labs, reviewing records from [facility] on 10/25/2023, reviewing echocardiogram extensively with patient, discussing advanced therapies,, seeing the patient, documenting in the record, and arranging follow ups.    Penda Venturi Advanced Heart Failure Mechanical Circulatory Support

## 2023-11-15 ENCOUNTER — Encounter (HOSPITAL_COMMUNITY): Payer: Self-pay | Admitting: Cardiology

## 2023-11-15 ENCOUNTER — Ambulatory Visit (HOSPITAL_COMMUNITY)
Admission: RE | Admit: 2023-11-15 | Discharge: 2023-11-15 | Disposition: A | Discharge status: Home / Self Care | Payer: MEDICAID | Source: Ambulatory Visit | Attending: Cardiology | Admitting: Cardiology

## 2023-11-15 VITALS — BP 108/84 | HR 107 | Wt 178.0 lb

## 2023-11-15 DIAGNOSIS — E119 Type 2 diabetes mellitus without complications: Secondary | ICD-10-CM | POA: Insufficient documentation

## 2023-11-15 DIAGNOSIS — E1169 Type 2 diabetes mellitus with other specified complication: Secondary | ICD-10-CM

## 2023-11-15 DIAGNOSIS — R9431 Abnormal electrocardiogram [ECG] [EKG]: Secondary | ICD-10-CM | POA: Insufficient documentation

## 2023-11-15 DIAGNOSIS — I428 Other cardiomyopathies: Secondary | ICD-10-CM | POA: Diagnosis not present

## 2023-11-15 DIAGNOSIS — I7300 Raynaud's syndrome without gangrene: Secondary | ICD-10-CM | POA: Diagnosis not present

## 2023-11-15 DIAGNOSIS — I110 Hypertensive heart disease with heart failure: Secondary | ICD-10-CM | POA: Diagnosis not present

## 2023-11-15 DIAGNOSIS — I5022 Chronic systolic (congestive) heart failure: Secondary | ICD-10-CM | POA: Diagnosis not present

## 2023-11-15 DIAGNOSIS — I10 Essential (primary) hypertension: Secondary | ICD-10-CM | POA: Diagnosis not present

## 2023-11-15 LAB — BASIC METABOLIC PANEL WITH GFR
Anion gap: 9 (ref 5–15)
BUN: 14 mg/dL (ref 6–20)
CO2: 23 mmol/L (ref 22–32)
Calcium: 9 mg/dL (ref 8.9–10.3)
Chloride: 104 mmol/L (ref 98–111)
Creatinine, Ser: 1.1 mg/dL — ABNORMAL HIGH (ref 0.44–1.00)
GFR, Estimated: >60 mL/min (ref >60)
Glucose, Bld: 218 mg/dL — ABNORMAL HIGH (ref 70–99)
Potassium: 4.5 mmol/L (ref 3.5–5.1)
Sodium: 136 mmol/L (ref 135–145)

## 2023-11-15 LAB — BRAIN NATRIURETIC PEPTIDE: B Natriuretic Peptide: 212.4 pg/mL — ABNORMAL HIGH (ref 0.0–100.0)

## 2023-11-15 LAB — DIGOXIN LEVEL: Digoxin Level: 0.8 ng/mL (ref 0.8–2.0)

## 2023-11-15 MED ORDER — DAPAGLIFLOZIN PROPANEDIOL 10 MG PO TABS: 10.0000 mg | ORAL_TABLET | Freq: Every day | ORAL | 3 refills | Status: DC

## 2023-11-15 MED ORDER — DIGOXIN 125 MCG PO TABS: 0.1250 mg | ORAL_TABLET | Freq: Every day | ORAL | 3 refills | Status: DC

## 2023-11-15 MED ORDER — FUROSEMIDE 40 MG PO TABS: 40.0000 mg | ORAL_TABLET | Freq: Every day | ORAL | 3 refills | Status: DC

## 2023-11-15 MED ORDER — ENTRESTO 24-26 MG PO TABS: 1.0000 | ORAL_TABLET | Freq: Two times a day (BID) | ORAL | 3 refills | Status: DC

## 2023-11-15 MED ORDER — POTASSIUM CHLORIDE CRYS ER 20 MEQ PO TBCR: 20.0000 meq | EXTENDED_RELEASE_TABLET | Freq: Every day | ORAL | 3 refills | Status: DC

## 2023-11-15 MED ORDER — METOPROLOL SUCCINATE ER 25 MG PO TB24: 12.5000 mg | ORAL_TABLET | Freq: Every day | ORAL | 3 refills | Status: DC

## 2023-11-15 MED ORDER — SPIRONOLACTONE 25 MG PO TABS: 12.5000 mg | ORAL_TABLET | Freq: Every day | ORAL | 3 refills | Status: DC

## 2023-11-15 NOTE — Patient Instructions (Signed)
 Medication Changes:  START METOPROLOL SUCCINATE 12.5MG  DAILY AT BEDTIME   Lab Work:  Labs done today, your results will be available in MyChart, we will contact you for abnormal readings.  Follow-Up in: 2-3 WEEKS WITH APP/PHARMACY   AND THEN AGAIN IN 6 WEEKS WITH DR. Bruce Caper   At the [facility], you and your health needs are our priority. We have a designated team specialized in the treatment of Heart Failure. This Care Team includes your primary Heart Failure Specialized Cardiologist (physician), Advanced Practice Providers (APPs- Physician Assistants and Nurse Practitioners), and Pharmacist who all work together to provide you with the care you need, when you need it.   You may see any of the following providers on your designated Care Team at your next follow up:  Dr. Jules Oar Dr. Peder Bourdon Dr. Alwin Baars Dr. Judyth Nunnery Nieves Bars, NP Ruddy Corral, [place] [facility] [place], [place] Dennise Fitz, NP [place] Lee, NP Luster Salters, PharmD   Please be sure to bring in all your medications bottles to every appointment.   Need to Contact Us :  If you have any questions or concerns before your next appointment please send us  a message through [place] or call our office at [phone].    TO LEAVE A MESSAGE FOR THE NURSE SELECT OPTION 2, PLEASE LEAVE A MESSAGE INCLUDING: YOUR NAME DATE OF BIRTH CALL BACK NUMBER REASON FOR CALL**this is important as we prioritize the call backs  YOU WILL RECEIVE A CALL BACK THE SAME DAY AS LONG AS YOU CALL BEFORE 4:00 PM

## 2023-11-29 NOTE — Progress Notes (Signed)
   ADVANCED HEART FAILURE CLINIC NOTE  Referring Physician: [facility]  Primary Care: [facility] Primary Cardiologist:  CC: Chronic systolic heart failure  HPI: Cheryl Curry is a 38 y.o. 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  She was admitted in May 2025 for acute systolic heart failure and hypertensive urgency with echocardiogram demonstrating EF of 20 to 25%.  Patient was started on Entresto , Farxiga , low-dose diuretics and discharged home.  Since that time she has been seen in transition of care clinic where a cardiac MRI was ordered and meds were further uptitrated.  Ms. Cheryl Curry has 3 kids at home.  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  She reports that she is very busy at work and currently taking time off due to recently diagnosed heart failure.  From a functional standpoint, she can perform all ADLs and to complete grocery trips however does become very fatigued afterwards.  Overall her functional status appears to be NYHA III.  She has no orthopnea, PND and lower extremity edema has resolved.  Compliant with all medications.  No history of tobacco or drug use.  Family hx: significant hx of HTN; no heart disease.   PHYSICAL EXAM: There were no vitals filed for this visit.  GENERAL: NAD Lungs-CTA CARDIAC:  JVP: 7 cm          Normal rate with regular rhythm.  No murmur.  Pulses 2+.  No edema.  ABDOMEN: Soft, non-tender, non-distended.  EXTREMITIES: Warm and well perfused.  NEUROLOGIC: No obvious FND   DATA REVIEW  ECG: 10/25/2023: Sinus tachycardia with poor R wave progression as per my personal interpretation  ECHO: 10/15/2023: EF 20 to 25%, normal RV function as per my personal interpretation   ASSESSMENT & PLAN:  Chronic systolic heart failure Etiology of HF: Nonischemic cardiomyopathy.   No significant family history of heart failure.  She does have a history of hypertension dating back to her teenage years.  Cardiac MRI scheduled for July.  Will pursue additional testing including heart catheterization if LV function does not improve or MRI demonstrates any other disease processes. NYHA class / AHA Stage: NYHA III Volume status & Diuretics: Euvolemic, Lasix  40 mg daily Vasodilators:Entresto  24/26mg  BID Beta-Blocker:toprol  12.5mg  daily, digoxin  125 mcg daily.  Repeat dig level today MRA:spironolactone  12.5mg  daily Cardiometabolic:Farxiga  10mg  daily Devices therapies & Valvulopathies: Not currently indicated, uptitrating GDMT. Advanced therapies: Currently indicated  2.  Type 2 diabetes - Followed by her PCP - Repeat A1c today  3.  Hypertension - Reports long history of high blood pressure. - BMP/BNP today.  Well-controlled.  I spent 31 minutes caring for this patient today including face to face time, ordering and reviewing labs, reviewing records from [facility] on 10/25/2023, reviewing echocardiogram extensively with patient, discussing advanced therapies,, seeing the patient, documenting in the record, and arranging follow ups.    Aditya Sabharwal Advanced Heart Failure Mechanical Circulatory Support

## 2023-11-30 ENCOUNTER — Telehealth (HOSPITAL_COMMUNITY): Payer: Self-pay

## 2023-11-30 NOTE — Telephone Encounter (Signed)
 Called to confirm/remind patient of their appointment at the Advanced Heart Failure Clinic on 12/01/23.   Appointment:   [x] Confirmed  [] Left mess   [] No answer/No voice mail  [] VM Full/unable to leave message  [] Phone not in service  Patient reminded to bring all medications and/or complete list.  Confirmed patient has transportation. Gave directions, instructed to utilize valet parking.

## 2023-12-01 ENCOUNTER — Encounter (HOSPITAL_COMMUNITY): Payer: Self-pay

## 2023-12-01 ENCOUNTER — Ambulatory Visit (HOSPITAL_COMMUNITY)
Admission: RE | Admit: 2023-12-01 | Discharge: 2023-12-01 | Disposition: A | Discharge status: Home / Self Care | Payer: MEDICAID | Source: Ambulatory Visit | Attending: Family Medicine | Admitting: Family Medicine

## 2023-12-01 VITALS — BP 142/102 | HR 89 | Ht 66.0 in | Wt 179.0 lb

## 2023-12-01 DIAGNOSIS — Z7984 Long term (current) use of oral hypoglycemic drugs: Secondary | ICD-10-CM | POA: Insufficient documentation

## 2023-12-01 DIAGNOSIS — E119 Type 2 diabetes mellitus without complications: Secondary | ICD-10-CM | POA: Insufficient documentation

## 2023-12-01 DIAGNOSIS — Z793 Long term (current) use of hormonal contraceptives: Secondary | ICD-10-CM | POA: Diagnosis not present

## 2023-12-01 DIAGNOSIS — I110 Hypertensive heart disease with heart failure: Secondary | ICD-10-CM | POA: Insufficient documentation

## 2023-12-01 DIAGNOSIS — I7300 Raynaud's syndrome without gangrene: Secondary | ICD-10-CM | POA: Insufficient documentation

## 2023-12-01 DIAGNOSIS — Z79899 Other long term (current) drug therapy: Secondary | ICD-10-CM | POA: Insufficient documentation

## 2023-12-01 DIAGNOSIS — R0789 Other chest pain: Secondary | ICD-10-CM | POA: Diagnosis not present

## 2023-12-01 DIAGNOSIS — E1169 Type 2 diabetes mellitus with other specified complication: Secondary | ICD-10-CM

## 2023-12-01 DIAGNOSIS — I10 Essential (primary) hypertension: Secondary | ICD-10-CM

## 2023-12-01 DIAGNOSIS — I428 Other cardiomyopathies: Secondary | ICD-10-CM | POA: Diagnosis not present

## 2023-12-01 DIAGNOSIS — I5022 Chronic systolic (congestive) heart failure: Secondary | ICD-10-CM | POA: Insufficient documentation

## 2023-12-01 DIAGNOSIS — R0683 Snoring: Secondary | ICD-10-CM | POA: Diagnosis not present

## 2023-12-01 MED ORDER — ENTRESTO 49-51 MG PO TABS: 1.0000 | ORAL_TABLET | Freq: Two times a day (BID) | ORAL | 3 refills | Status: DC

## 2023-12-01 MED ORDER — METOPROLOL SUCCINATE ER 25 MG PO TB24: 25.0000 mg | ORAL_TABLET | Freq: Every day | ORAL | 3 refills | Status: DC

## 2023-12-01 NOTE — Patient Instructions (Signed)
 Medication Changes:  INCREASE ENTRESTO  TO 49/51MG  TWICE DAILY   INCREASE METOPROLOL  TO 25MG  ONCE DAILY   Testing/Procedures:  SLEEP STUDY ORDERED- THIS WILL BE DONE IN LAB- SCHEDULING WILL CALL TO ARRANGE ONCE APPROVED   Follow-Up in: KEEP FOLLOW UP AS SCHEDULED   At the [facility], you and your health needs are our priority. We have a designated team specialized in the treatment of Heart Failure. This Care Team includes your primary Heart Failure Specialized Cardiologist (physician), Advanced Practice Providers (APPs- Physician Assistants and Nurse Practitioners), and Pharmacist who all work together to provide you with the care you need, when you need it.   You may see any of the following providers on your designated Care Team at your next follow up:  Dr. Toribio Fuel Dr. Ezra Shuck Dr. Ria Commander Dr. Odis Brownie Greig Mosses, NP Caffie Shed, [place] [facility] [place], [place] Beckey Coe, NP [place] Lee, NP Tinnie Redman, PharmD   Please be sure to bring in all your medications bottles to every appointment.   Need to Contact Us :  If you have any questions or concerns before your next appointment please send us  a message through [place] or call our office at [phone].    TO LEAVE A MESSAGE FOR THE NURSE SELECT OPTION 2, PLEASE LEAVE A MESSAGE INCLUDING: YOUR NAME DATE OF BIRTH CALL BACK NUMBER REASON FOR CALL**this is important as we prioritize the call backs  YOU WILL RECEIVE A CALL BACK THE SAME DAY AS LONG AS YOU CALL BEFORE 4:00 PM

## 2023-12-13 NOTE — Progress Notes (Signed)
   ADVANCED HE[facility] FAILURE CLINIC NOTE  Referring Physician: [facility]  Adult A*  Primary Care: [facility]  Adult And Pediatric Primary Cardiologist:  CC: Chronic systolic heart failure  HPI: Cheryl Curry is a 38 y.o. 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  She was admitted in May 2025 for acute systolic heart failure and hypertensive urgency with echocardiogram demonstrating EF of 20 to 25%.  Patient was started on Entresto , Farxiga , low-dose diuretics and discharged home.  Since that time she has been seen in transition of care clinic where a cardiac MRI was ordered and meds were further uptitrated.  Cheryl Curry has 3 kids at home.  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  She reports that she is very busy at work and currently taking time off due to recently diagnosed heart failure.  From a functional standpoint, she can perform all ADLs and to complete grocery trips however does become very fatigued afterwards.  Overall her functional status appears to be NYHA III.  She has no orthopnea, PND and lower extremity edema has resolved.  Compliant with all medications.  No history of tobacco or drug use.  Family hx: significant hx of HTN; no heart disease.   PHYSICAL EXAM: There were no vitals filed for this visit. GENERAL: NAD Lungs- *** CARDIAC:  JVP: *** cm          Normal rate with regular rhythm. *** murmur.  Pulses ***. *** edema.  ABDOMEN: Soft, non-tender, non-distended.  EXTREMITIES: Warm and well perfused.  NEUROLOGIC: No obvious FND   DATA REVIEW  ECG: 10/25/2023: Sinus tachycardia with poor R wave progression as per my personal interpretation  ECHO: 10/15/2023: EF 20 to 25%, normal RV function as per my personal interpretation   ASSESSMENT & PLAN:  Chronic systolic heart failure Etiology of HF: Nonischemic  cardiomyopathy.  No significant family history of heart failure.  She does have a history of hypertension dating back to her teenage years.  Cardiac MRI scheduled for July.  Will pursue additional testing including heart catheterization if LV function does not improve or MRI demonstrates any other disease processes. NYHA class / AHA Stage: NYHA III Volume status & Diuretics: Euvolemic, Lasix  40 mg daily Vasodilators:Entresto  24/26mg  BID Beta-Blocker:toprol  12.5mg  daily, digoxin  125 mcg daily.  Repeat dig level today MRA:spironolactone  12.5mg  daily Cardiometabolic:Farxiga  10mg  daily Devices therapies & Valvulopathies: Not currently indicated, uptitrating GDMT. Advanced therapies: Currently indicated  2.  Type 2 diabetes - Followed by her PCP - Repeat A1c today  3.  Hypertension - Reports long history of high blood pressure. - BMP/BNP today.  Well-controlled.  I spent *** minutes caring for this patient today including face to face time, ordering and reviewing labs, reviewing records from ***, seeing the patient, documenting in the record, and arranging follow ups.    Cheryl Curry Advanced [facility] Failure Mechanical Circulatory Support

## 2023-12-14 ENCOUNTER — Ambulatory Visit (HOSPITAL_COMMUNITY)
Admission: RE | Admit: 2023-12-14 | Discharge: 2023-12-14 | Disposition: A | Discharge status: Home / Self Care | Payer: MEDICAID | Source: Ambulatory Visit | Attending: Cardiology | Admitting: Cardiology

## 2023-12-14 ENCOUNTER — Encounter (HOSPITAL_COMMUNITY): Payer: Self-pay | Admitting: Cardiology

## 2023-12-14 VITALS — BP 122/80 | HR 98 | Wt 178.6 lb

## 2023-12-14 DIAGNOSIS — I428 Other cardiomyopathies: Secondary | ICD-10-CM | POA: Insufficient documentation

## 2023-12-14 DIAGNOSIS — E119 Type 2 diabetes mellitus without complications: Secondary | ICD-10-CM | POA: Insufficient documentation

## 2023-12-14 DIAGNOSIS — I110 Hypertensive heart disease with heart failure: Secondary | ICD-10-CM | POA: Diagnosis not present

## 2023-12-14 DIAGNOSIS — I5022 Chronic systolic (congestive) heart failure: Secondary | ICD-10-CM | POA: Insufficient documentation

## 2023-12-14 DIAGNOSIS — Z79899 Other long term (current) drug therapy: Secondary | ICD-10-CM | POA: Insufficient documentation

## 2023-12-14 DIAGNOSIS — Z7984 Long term (current) use of oral hypoglycemic drugs: Secondary | ICD-10-CM | POA: Insufficient documentation

## 2023-12-14 DIAGNOSIS — I7300 Raynaud's syndrome without gangrene: Secondary | ICD-10-CM | POA: Diagnosis not present

## 2023-12-14 DIAGNOSIS — I10 Essential (primary) hypertension: Secondary | ICD-10-CM

## 2023-12-14 DIAGNOSIS — E1169 Type 2 diabetes mellitus with other specified complication: Secondary | ICD-10-CM | POA: Diagnosis not present

## 2023-12-14 LAB — BASIC METABOLIC PANEL WITH GFR
Anion gap: 11 (ref 5–15)
BUN: 9 mg/dL (ref 6–20)
CO2: 20 mmol/L — ABNORMAL LOW (ref 22–32)
Calcium: 9.1 mg/dL (ref 8.9–10.3)
Chloride: 106 mmol/L (ref 98–111)
Creatinine, Ser: 0.89 mg/dL (ref 0.44–1.00)
GFR, Estimated: >60 mL/min (ref >60)
Glucose, Bld: 166 mg/dL — ABNORMAL HIGH (ref 70–99)
Potassium: 4.1 mmol/L (ref 3.5–5.1)
Sodium: 137 mmol/L (ref 135–145)

## 2023-12-14 LAB — DIGOXIN LEVEL: Digoxin Level: <0.4 ng/mL — ABNORMAL LOW (ref 0.8–2.0)

## 2023-12-14 LAB — BRAIN NATRIURETIC PEPTIDE: B Natriuretic Peptide: 226.5 pg/mL — ABNORMAL HIGH (ref 0.0–100.0)

## 2023-12-14 MED ORDER — SACUBITRIL-VALSARTAN 97-103 MG PO TABS: 1.0000 | ORAL_TABLET | Freq: Two times a day (BID) | ORAL | 3 refills | Status: AC

## 2023-12-14 MED ORDER — DIGOXIN 62.5 MCG PO TABS: 0.0625 mg | ORAL_TABLET | Freq: Every day | ORAL | 3 refills | Status: DC

## 2023-12-14 NOTE — Addendum Note (Signed)
 Encounter addended by: Trell Secrist B, RN on: 12/14/2023 9:51 AM  Actions taken: Clinical Note Signed, Order list changed, Diagnosis association updated, Charge Capture section accepted

## 2023-12-14 NOTE — Patient Instructions (Signed)
 Medication Changes:  INCREASE ENTRESTO  TO 97/103MG  TWICE DAILY---NEW PRESCRIPTION SENT TO YOUR PHARMACY, YOU MAY ALSO TAKE (2) OF YOUR 49/51MG  TWICE DAILY   DECREASE DIGOXIN  TO 0.00625MG  ONCE DAILY   Lab Work:  Labs done today, your results will be available in MyChart, we will contact you for abnormal readings.  Follow-Up in: 1 MONTH AS SCHEDULED WITH NP/PA   AND THEN AGAIN IN 2 MONTHS AS SCHEDULED   At the [facility], you and your health needs are our priority. We have a designated team specialized in the treatment of Heart Failure. This Care Team includes your primary Heart Failure Specialized Cardiologist (physician), Advanced Practice Providers (APPs- Physician Assistants and Nurse Practitioners), and Pharmacist who all work together to provide you with the care you need, when you need it.   You may see any of the following providers on your designated Care Team at your next follow up:  Dr. Toribio Fuel Dr. Ezra Shuck Dr. Ria Commander Dr. Odis Brownie Greig Mosses, NP Caffie Shed, [place] [facility] [place], [place] Beckey Coe, NP [place] Lee, NP Tinnie Redman, PharmD   Please be sure to bring in all your medications bottles to every appointment.   Need to Contact Us :  If you have any questions or concerns before your next appointment please send us  a message through [place] or call our office at [phone].    TO LEAVE A MESSAGE FOR THE NURSE SELECT OPTION 2, PLEASE LEAVE A MESSAGE INCLUDING: YOUR NAME DATE OF BIRTH CALL BACK NUMBER REASON FOR CALL**this is important as we prioritize the call backs  YOU WILL RECEIVE A CALL BACK THE SAME DAY AS LONG AS YOU CALL BEFORE 4:00 PM

## 2023-12-17 ENCOUNTER — Ambulatory Visit (HOSPITAL_COMMUNITY): Payer: Self-pay

## 2023-12-19 ENCOUNTER — Ambulatory Visit (HOSPITAL_COMMUNITY): Payer: Self-pay

## 2023-12-21 ENCOUNTER — Encounter (HOSPITAL_COMMUNITY): Payer: Self-pay

## 2023-12-23 ENCOUNTER — Ambulatory Visit (HOSPITAL_COMMUNITY)
Admission: RE | Admit: 2023-12-23 | Discharge: 2023-12-23 | Disposition: A | Discharge status: Home / Self Care | Payer: MEDICAID | Source: Ambulatory Visit | Attending: Adult Health | Admitting: Adult Health

## 2023-12-23 ENCOUNTER — Other Ambulatory Visit (HOSPITAL_COMMUNITY): Payer: Self-pay | Admitting: Adult Health

## 2023-12-23 ENCOUNTER — Ambulatory Visit (HOSPITAL_COMMUNITY): Payer: MEDICAID

## 2023-12-23 DIAGNOSIS — I5021 Acute systolic (congestive) heart failure: Secondary | ICD-10-CM

## 2023-12-23 MED ORDER — GADOBUTROL 1 MMOL/ML IV SOLN
10.0000 mL | Freq: Once | INTRAVENOUS | Status: AC | PRN
  Administered 2023-12-23: 10 mL via INTRAVENOUS

## 2023-12-27 ENCOUNTER — Ambulatory Visit (HOSPITAL_COMMUNITY): Payer: Self-pay | Admitting: Adult Health

## 2024-01-13 ENCOUNTER — Telehealth (HOSPITAL_COMMUNITY): Payer: Self-pay

## 2024-01-13 NOTE — Telephone Encounter (Signed)
 Called to confirm/remind patient of their appointment at the [facility] on 01/14/24.   Appointment:   [] Confirmed  [x] Left mess   [] No answer/No voice mail  [] VM Full/unable to leave message  [] Phone not in service  And to bring in all medications and/or complete list.

## 2024-01-13 NOTE — Progress Notes (Signed)
   ADVANCED HEART FAILURE CLINIC NOTE  Referring Physician: [facility]  Adult A*  Primary Care: [facility]  Adult And Pediatric HF Cardiologist: Dr. Gardenia   HPI: Cheryl Curry is a 38 y.o. 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  She was admitted in May 2025 for acute systolic heart failure and hypertensive urgency with echocardiogram demonstrating EF of 20 to 25%.  Patient was started on Entresto , Farxiga , low-dose diuretics and discharged home.  Since that time she has been seen in transition of care clinic where a cardiac MRI was ordered and meds were further uptitrated.  Cheryl Curry has 3 kids at home.  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  She reports that she is very busy at work and currently taking time off due to recently diagnosed heart failure.    Family hx: significant hx of HTN; no heart disease.  Interval hx: - Dyspne has improved significantly in the past 32month.  - She has 3 kids; spends much of her time cleaning. Reports that she vacuums the house and recently flipped her mattress without much difficulty.  - She has very infrequent lightheadedness.  - No LE edema   PHYSICAL EXAM: There were no vitals filed for this visit.  There were no vitals filed for this visit.  General: Well appearing. No distress on RA Cardiac: JVP ***. S1 and S2 present. No murmurs or rub. Resp: Lung sounds clear and equal B/L Abdomen: Soft, non-tender, non-distended.  Extremities: Warm and dry.  *** edema.  Neuro: Alert and oriented x3. Affect pleasant. Moves all extremities without difficulty.   DATA REVIEW  ECG: 10/25/2023: Sinus tachycardia with poor R wave progression as per my personal interpretation  ECHO: 10/15/2023: EF 20 to 25%, normal RV function as per my personal interpretation  CMR 12/26/23: LVEF 23%, RV 43%, scar  pattern seen in NICM  ASSESSMENT & PLAN:  Chronic systolic heart failure Etiology of HF: NICM. No significant family history of heart failure.  Suspect HTN CM.   Cardiac MRI scheduled for July.  Will pursue additional testing including heart catheterization if LV function does not improve or MRI demonstrates any other disease processes. NYHA class / AHA Stage: NYHA III Volume & Diuretics: Euvolemic, Lasix  40 mg daily GDMT   - ARB/ARNi: continue Entresto  97/103 mg bid  - ? blocker: continue toprol  XL 25 mg daily; continue digoxin  0.0625 mg daily  - MRA: continue spirolactone 12.5 mg daily  - SGLT2i: continue farxiga  10 mg daily Devices therapies & Valvulopathies: Not currently indicated, uptitrating GDMT. Advanced therapies: Currently indicated  2.  Type 2 diabetes - Followed by her PCP - A1C of 9.2 on 09/07/23 - Reducing sugary beverages  3.  Hypertension - BP well controlled; increasing entresto  today - Repeat BMP/BNP today.   Follow up in *** with ***  [place] Sherylann Vangorden, NP 01/13/24

## 2024-01-14 ENCOUNTER — Encounter (HOSPITAL_COMMUNITY): Payer: Self-pay

## 2024-01-14 ENCOUNTER — Ambulatory Visit (HOSPITAL_COMMUNITY)
Admission: RE | Admit: 2024-01-14 | Discharge: 2024-01-14 | Disposition: A | Discharge status: Home / Self Care | Payer: MEDICAID | Source: Ambulatory Visit | Attending: Cardiology | Admitting: Cardiology

## 2024-01-14 VITALS — BP 152/113 | HR 82 | Ht 66.0 in | Wt 179.2 lb

## 2024-01-14 DIAGNOSIS — E1169 Type 2 diabetes mellitus with other specified complication: Secondary | ICD-10-CM

## 2024-01-14 DIAGNOSIS — I5022 Chronic systolic (congestive) heart failure: Secondary | ICD-10-CM | POA: Diagnosis not present

## 2024-01-14 DIAGNOSIS — I110 Hypertensive heart disease with heart failure: Secondary | ICD-10-CM | POA: Diagnosis present

## 2024-01-14 DIAGNOSIS — I10 Essential (primary) hypertension: Secondary | ICD-10-CM | POA: Diagnosis not present

## 2024-01-14 DIAGNOSIS — I428 Other cardiomyopathies: Secondary | ICD-10-CM | POA: Insufficient documentation

## 2024-01-14 DIAGNOSIS — E119 Type 2 diabetes mellitus without complications: Secondary | ICD-10-CM | POA: Insufficient documentation

## 2024-01-14 DIAGNOSIS — I7300 Raynaud's syndrome without gangrene: Secondary | ICD-10-CM | POA: Diagnosis not present

## 2024-01-14 MED ORDER — SPIRONOLACTONE 25 MG PO TABS: 25.0000 mg | ORAL_TABLET | Freq: Every day | ORAL | 3 refills | Status: DC

## 2024-01-14 NOTE — Patient Instructions (Signed)
 Increase Spiro to 25 mg daily - updated Rx sent. Return in two weeks for labs - see below. Return to see Dr. Gardenia at your scheduled appointment date and time. Please call us  at [phone] if any questions or concerns prior to your next appointment.

## 2024-01-28 ENCOUNTER — Ambulatory Visit (HOSPITAL_COMMUNITY)
Admission: RE | Admit: 2024-01-28 | Discharge: 2024-01-28 | Disposition: A | Discharge status: Home / Self Care | Payer: MEDICAID | Source: Ambulatory Visit | Attending: Cardiology | Admitting: Cardiology

## 2024-01-28 DIAGNOSIS — I5022 Chronic systolic (congestive) heart failure: Secondary | ICD-10-CM | POA: Diagnosis present

## 2024-01-28 LAB — BASIC METABOLIC PANEL WITH GFR
Anion gap: 10 (ref 5–15)
BUN: 13 mg/dL (ref 6–20)
CO2: 23 mmol/L (ref 22–32)
Calcium: 9.1 mg/dL (ref 8.9–10.3)
Chloride: 102 mmol/L (ref 98–111)
Creatinine, Ser: 0.86 mg/dL (ref 0.44–1.00)
GFR, Estimated: >60 mL/min (ref >60)
Glucose, Bld: 180 mg/dL — ABNORMAL HIGH (ref 70–99)
Potassium: 4.1 mmol/L (ref 3.5–5.1)
Sodium: 135 mmol/L (ref 135–145)

## 2024-01-28 LAB — BRAIN NATRIURETIC PEPTIDE: B Natriuretic Peptide: 106.1 pg/mL — ABNORMAL HIGH (ref 0.0–100.0)

## 2024-01-31 ENCOUNTER — Ambulatory Visit (HOSPITAL_COMMUNITY): Payer: Self-pay | Admitting: Cardiology

## 2024-02-10 ENCOUNTER — Telehealth (HOSPITAL_COMMUNITY): Payer: Self-pay | Admitting: Cardiology

## 2024-02-10 MED ORDER — HYDRALAZINE HCL 25 MG PO TABS: 25.0000 mg | ORAL_TABLET | Freq: Three times a day (TID) | ORAL | 3 refills | Status: DC

## 2024-02-10 NOTE — Telephone Encounter (Signed)
 Please call add hydralazine  25 mg three times a day.Please make sure and take the second dose entresto  now.   If headache persists or worsens would go to ED for evaluation.   Lancer Thurner NP-C   4:50 PM

## 2024-02-10 NOTE — Telephone Encounter (Signed)
 Pt returned call   Pt reports medication compliance  Pt denies lifestyle changes, increase in stressors  Reports home b/p readings have been good  Reports she recently returned to work 02/07/24  Reports she was prompted to check b/p with HA Denies visual changes, CP jaw/arm pain

## 2024-02-10 NOTE — Telephone Encounter (Signed)
 Detailed message left for patient New med Take dose of entresto  NOW Report to er if symptoms worsen or persist    Will confirm info was received 9/5

## 2024-02-10 NOTE — Addendum Note (Signed)
 Addended by: Arthurine Oleary, DALTON HERO on: 02/10/2024 04:55 PM   Modules accepted: Orders

## 2024-02-10 NOTE — Telephone Encounter (Signed)
 Patient left VM on triage with reports of elevated B/P reading  Reports b/p today 182/115 Reports HA Meds taken  Returned call for details-LMOM   Will route to provider in the event changes are needed

## 2024-02-17 ENCOUNTER — Ambulatory Visit (HOSPITAL_COMMUNITY): Payer: Self-pay

## 2024-02-20 ENCOUNTER — Ambulatory Visit (HOSPITAL_COMMUNITY): Payer: MEDICAID

## 2024-02-20 ENCOUNTER — Encounter (HOSPITAL_COMMUNITY): Payer: Self-pay

## 2024-02-20 ENCOUNTER — Ambulatory Visit (INDEPENDENT_AMBULATORY_CARE_PROVIDER_SITE_OTHER)
Admission: EM | Admit: 2024-02-20 | Discharge: 2024-02-20 | Disposition: A | Discharge status: Home / Self Care | Payer: MEDICAID | Source: Home / Self Care

## 2024-02-20 ENCOUNTER — Ambulatory Visit (HOSPITAL_BASED_OUTPATIENT_CLINIC_OR_DEPARTMENT_OTHER): Payer: MEDICAID | Attending: Family Medicine | Admitting: Cardiology

## 2024-02-20 DIAGNOSIS — I5022 Chronic systolic (congestive) heart failure: Secondary | ICD-10-CM

## 2024-02-20 DIAGNOSIS — I493 Ventricular premature depolarization: Secondary | ICD-10-CM | POA: Insufficient documentation

## 2024-02-20 DIAGNOSIS — I440 Atrioventricular block, first degree: Secondary | ICD-10-CM | POA: Diagnosis not present

## 2024-02-20 DIAGNOSIS — N760 Acute vaginitis: Secondary | ICD-10-CM

## 2024-02-20 LAB — POCT URINALYSIS DIP (MANUAL ENTRY)
Bilirubin, UA: NEGATIVE
Blood, UA: NEGATIVE
Glucose, UA: >=1000 mg/dL — AB
Ketones, POC UA: NEGATIVE mg/dL
Nitrite, UA: NEGATIVE
Protein Ur, POC: NEGATIVE mg/dL
Spec Grav, UA: 1.01 (ref 1.010–1.025)
Urobilinogen, UA: 0.2 U/dL
pH, UA: 6.5 (ref 5.0–8.0)

## 2024-02-20 LAB — POCT URINE PREGNANCY: Preg Test, Ur: NEGATIVE

## 2024-02-20 MED ORDER — METRONIDAZOLE 500 MG PO TABS: 500.0000 mg | ORAL_TABLET | Freq: Two times a day (BID) | ORAL | 0 refills | Status: DC

## 2024-02-20 MED ORDER — FLUCONAZOLE 150 MG PO TABS: 150.0000 mg | ORAL_TABLET | Freq: Every day | ORAL | 0 refills | Status: DC

## 2024-02-20 NOTE — Discharge Instructions (Addendum)
 Take the Flagyl  twice daily with food.  Do not drink alcohol on this medication as it can make you very sick.  Take the Diflucan  as needed for antibiotic induced yeast infection.  Better control of your type 2 diabetes will help with your vaginal pH and make imbalances such as BV or yeast less likely to occur.  Several contact if additional treatment is needed based on cytology swab.  Return to clinic for new or urgent symptoms.  Follow-up with your primary care provider for ongoing management of your type 2 diabetes.

## 2024-02-20 NOTE — ED Provider Notes (Signed)
 [facility]    CSN: [id number] Arrival date & time: 02/20/24  1109      History   Chief Complaint Chief Complaint  Patient presents with   Vaginal Discharge    HPI Cheryl Curry is a 38 y.o. female.   Patient presents to clinic over concerns of malodorous vaginal discharge for the past two weeks. The odor or the discharge recently got worse, she is unsure which, and made her come to clinic today. Denies concerns for STIs, denies new partners or known exposures. Declines HIV and syphilis testing today.   Did have an episode of discomfort with urination, this has since resolved. Denies urgency, frequency or dysuria.   Hx of DM2, has not been checking her CBG at home, does have supplies. Prone to yeast and BV d/t DM2.   The history is provided by the patient and medical records.  Vaginal Discharge   Past Medical History:  Diagnosis Date   Diabetes mellitus without complication (HCC)    Hypertension    Pregnancy induced hypertension     Patient Active Problem List   Diagnosis Date Noted   Acute systolic HF (heart failure) (HCC) 10/15/2023   Acute CHF (congestive heart failure) (HCC) 10/14/2023   Essential hypertension 08/09/2019   Type 2 diabetes mellitus (HCC) 08/09/2019   Encounter for insertion subdermal contraceptive 05/27/2018    Past Surgical History:  Procedure Laterality Date   NO PAST SURGERIES      OB History     Gravida  3   Para  3   Term  2   Preterm  1   AB  0   Living  3      SAB      IAB      Ectopic      Multiple  0   Live Births  3            Home Medications    Prior to Admission medications   Medication Sig Start Date End Date Taking? Authorizing Provider  fluconazole  (DIFLUCAN ) 150 MG tablet Take 1 tablet (150 mg total) by mouth daily. 02/20/24  Yes Dailey Buccheri  N, FNP  metroNIDAZOLE  (FLAGYL ) 500 MG tablet Take 1 tablet (500 mg total) by mouth 2 (two) times daily. 02/20/24  Yes Min Tunnell,  Gilma Bessette  N, FNP  Acetaminophen  500 MG capsule Take 500-1,000 mg by mouth every 6 (six) hours as needed for pain (or headaches- Extra Strength TYLENOL  Rapid Release Gels).    [provider]  dapagliflozin  propanediol (FARXIGA ) 10 MG TABS tablet Take 1 tablet (10 mg total) by mouth daily before breakfast. 11/15/23   Sabharwal, Aditya, DO  digoxin  62.5 MCG TABS Take 0.0625 mg by mouth daily. 12/14/23   Sabharwal, Aditya, DO  furosemide  (LASIX ) 40 MG tablet Take 1 tablet (40 mg total) by mouth daily. 11/15/23   Sabharwal, Aditya, DO  glipiZIDE (GLUCOTROL) 5 MG tablet Take 10 mg by mouth 2 (two) times daily before a meal.    [provider]  HUMULIN  70/30 KWIKPEN (70-30) 100 UNIT/ML KwikPen Inject 25 Units into the skin 2 (two) times daily.    [provider]  hydrALAZINE  (APRESOLINE ) 25 MG tablet Take 1 tablet (25 mg total) by mouth 3 (three) times daily. 02/10/24 05/10/24  Clegg, Amy D, NP  LANTUS  SOLOSTAR 100 UNIT/ML Solostar Pen Inject 30 Units into the skin at bedtime.    [provider]  metoprolol  succinate (TOPROL -XL) 25 MG 24 hr tablet Take 1 tablet (25 mg  total) by mouth at bedtime. Take with or immediately following a meal. 12/01/23   Milford, Harlene HERO, FNP  OZEMPIC, 1 MG/DOSE, 2 MG/1.5ML SOPN Inject 1 mg into the skin every Monday.    [provider]  potassium chloride  SA (KLOR-CON  M) 20 MEQ tablet Take 1 tablet (20 mEq total) by mouth daily. 11/15/23   Sabharwal, Aditya, DO  sacubitril -valsartan  (ENTRESTO ) 97-103 MG Take 1 tablet by mouth 2 (two) times daily. 12/14/23   Sabharwal, Aditya, DO  spironolactone  (ALDACTONE ) 25 MG tablet Take 1 tablet (25 mg total) by mouth daily. 01/14/24   Lee, [place], NP  medroxyPROGESTERone  (PROVERA ) 10 MG tablet Take 1 tablet (10 mg total) by mouth daily. 08/24/18 03/01/19  Eveline Lynwood MATSU, MD  NIFEdipine  (PROCARDIA -XL/NIFEDICAL-XL) 30 MG 24 hr tablet Take 1 tablet (30 mg total) by mouth 2 (two) times daily. 05/27/18 03/01/19  Synthia Harlene, CNM    Family History Family History  Problem Relation Age of Onset   Hypertension Mother    Hypertension Father    Stroke Father    Hypertension Brother     Social History Social History   Tobacco Use   Smoking status: Never   Smokeless tobacco: Never  Vaping Use   Vaping status: Never Used  Substance Use Topics   Alcohol use: Yes    Comment: occassional   Drug use: No     Allergies   Patient has no known allergies.   Review of Systems Review of Systems  Per HPI  Physical Exam Triage Vital Signs ED Triage Vitals  Encounter Vitals Group     BP 02/20/24 1135 (!) 147/98     Girls Systolic BP Percentile --      Girls Diastolic BP Percentile --      Boys Systolic BP Percentile --      Boys Diastolic BP Percentile --      Pulse Rate 02/20/24 1135 91     Resp 02/20/24 1135 16     Temp 02/20/24 1135 98 F (36.7 C)     Temp Source 02/20/24 1135 Oral     SpO2 02/20/24 1135 97 %     Weight --      Height --      Head Circumference --      Peak Flow --      Pain Score 02/20/24 1136 0     Pain Loc --      Pain Education --      Exclude from Growth Chart --    No data found.  Updated Vital Signs BP (!) 147/98 (BP Location: Right Arm)   Pulse 91   Temp 98 F (36.7 C) (Oral)   Resp 16   LMP 12/21/2023 (Approximate)   SpO2 97%   Visual Acuity Right Eye Distance:   Left Eye Distance:   Bilateral Distance:    Right Eye Near:   Left Eye Near:    Bilateral Near:     Physical Exam Vitals and nursing note reviewed.  Constitutional:      Appearance: Normal appearance.  HENT:     Head: Normocephalic and atraumatic.     Right Ear: External ear normal.     Left Ear: External ear normal.     Nose: Nose normal.     Mouth/Throat:     Mouth: Mucous membranes are moist.  Eyes:     Conjunctiva/sclera: Conjunctivae normal.  Cardiovascular:     Rate and Rhythm: Normal rate.  Pulmonary:  Effort: Pulmonary effort is normal. No respiratory  distress.  Neurological:     General: No focal deficit present.     Mental Status: She is alert and oriented to person, place, and time.  Psychiatric:        Mood and Affect: Mood normal.        Behavior: Behavior normal.      UC Treatments / Results  Labs (all labs ordered are listed, but only abnormal results are displayed) Labs Reviewed  POCT URINALYSIS DIP (MANUAL ENTRY) - Abnormal; Notable for the following components:      Result Value   Clarity, UA cloudy (*)    Glucose, UA >=1,000 (*)    Leukocytes, UA Trace (*)    All other components within normal limits  POCT URINE PREGNANCY  CERVICOVAGINAL ANCILLARY ONLY    EKG   Radiology No results found.  Procedures Procedures (including critical care time)  Medications Ordered in UC Medications - No data to display  Initial Impression / Assessment and Plan / UC Course  I have reviewed the triage vital signs and the nursing notes.  Pertinent labs & imaging results that were available during my care of the patient were reviewed by me and considered in my medical decision making (see chart for details).  Vitals and triage reviewed, patient is hemodynamically stable.  Malodorous vaginal discharge consistent with BV, will treat empirically.  Will send in Diflucan  as well to prevent antibiotic induced yeast infection.  Cytology swab obtained and several contact if additional treatment is needed.  No current urinary symptoms.  UA Urine pregnancy negative.  Plan of care, follow-up care return precautions given, no questions at this time.    Final Clinical Impressions(s) / UC Diagnoses   Final diagnoses:  Acute vaginitis     Discharge Instructions      Take the Flagyl  twice daily with food.  Do not drink alcohol on this medication as it can make you very sick.  Take the Diflucan  as needed for antibiotic induced yeast infection.  Better control of your type 2 diabetes will help with your vaginal pH and make  imbalances such as BV or yeast less likely to occur.  Several contact if additional treatment is needed based on cytology swab.  Return to clinic for new or urgent symptoms.  Follow-up with your primary care provider for ongoing management of your type 2 diabetes.      ED Prescriptions     Medication Sig Dispense Auth. Provider   metroNIDAZOLE  (FLAGYL ) 500 MG tablet Take 1 tablet (500 mg total) by mouth 2 (two) times daily. 14 tablet Wadie Liew  N, FNP   fluconazole  (DIFLUCAN ) 150 MG tablet Take 1 tablet (150 mg total) by mouth daily. 1 tablet Dreama Thresea SAILOR, FNP      PDMP not reviewed this encounter.   Dreama Lonnie Rosado  N, FNP 02/20/24 1223

## 2024-02-20 NOTE — ED Triage Notes (Signed)
 Patient here today with c/o vaginal discharge X 2 weeks. Patient states that she has a little discomfort with she urinates. Denies other symptoms.

## 2024-02-21 LAB — CERVICOVAGINAL ANCILLARY ONLY
Bacterial Vaginitis (gardnerella): POSITIVE — AB
Candida Glabrata: NEGATIVE
Candida Vaginitis: NEGATIVE
Chlamydia: NEGATIVE
Comment: NEGATIVE
Comment: NEGATIVE
Comment: NEGATIVE
Comment: NEGATIVE
Comment: NEGATIVE
Comment: NORMAL
Neisseria Gonorrhea: NEGATIVE
Trichomonas: POSITIVE — AB

## 2024-02-23 ENCOUNTER — Ambulatory Visit (HOSPITAL_COMMUNITY): Payer: Self-pay

## 2024-02-28 NOTE — Procedures (Signed)
   Darryle Law [facility] [address] [place], [place] [address] Tel: [phone]   Fax: [phone]  Polysomnography Interpretation  Patient Name:  Cheryl Curry, Cheryl Curry Study Date:  02/20/2024 Referring Physician:  HARLENE MILFORD [id number]) %%sta Indications for Polysomnography The patient is a 38 year old Female who is 5' 6 and weighs 188.0 lbs. Her BMI equals 30.6.  A full night polysomnogram was performed to evaluate for Obstructive Sleep Apnea.  Medication  No Data.   Polysomnogram Data A full night polysomnogram recorded the standard physiologic parameters including EEG, EOG, EMG, EKG, nasal and oral airflow.  Respiratory parameters of chest and abdominal movements were recorded with Respiratory Inductance Plethysmography belts.  Oxygen saturation was recorded by pulse oximetry.   Sleep Architecture The total recording time of the polysomnogram was 371.4 minutes. The total sleep time was 216.0 minutes.  The patient spent 9.7% of total sleep time in Stage N1, 61.3% in Stage N2, 22.2% in Stages N3, and 6.7% in REM.  Sleep latency was 0.0 minutes.  REM latency was 51.4 minutes.  Sleep Efficiency was 58.2%.  Wake after Sleep Onset time was 155.5 minutes.  Respiratory Events The polysomnogram revealed a presence of 1 obstructive, 0 central, and 0 mixed apneas resulting in an Apnea index of 0.3 events per hour.  There were 13 hypopneas (>=3% desaturation and/or arousal) resulting in an Apnea\Hypopnea Index (AHI >=3% desaturation and/or arousal) of 3.9 events per hour.  There were 11 hypopneas (>=4% desaturation) resulting in an Apnea\Hypopnea Index (AHI >=4% desaturation) of 3.3 events per hour.  There were 5 Respiratory Effort Related Arousals resulting in a RERA index of 1.4 events per hour. The Respiratory Disturbance Index is 5.3 events per hour.  The snore index was 0 events per hour.  Mean oxygen saturation was 95.8%.  The lowest oxygen saturation  during sleep was 88.0%.  Time spent <=88% oxygen saturation was 0.3 minutes (0.1%).  Limb Activity There were 0 total limb movements recorded, of this total.  Cardiac Summary The average pulse rate was 96.7 bpm.  The minimum pulse rate was 50.0 bpm while the maximum pulse rate was 122.0 bpm.  Cardiac rhythm was normal sinus rhythm with occasional PACs, PVCs and first degree AV block.   Diagnosis:  Normal Sleep Study PVCs  Recommendations: 1. Normal study with no significant sleep disordered breathing. 2. Healthy sleep recommendations include: adequate nightly sleep (normal 7-9 hrs/night), avoidance of caffeine  after  noon and alcohol near bedtime, and maintaining a sleep environment that is cool, dark and quiet. 3. Weight loss for overweight patients is recommended.  4. Snoring recommendations include: weight loss where appropriate, side sleeping, and avoidance of alcohol before  bed. 5. Operation of motor vehicle or dangerous equipment must be avoided when feeling drowsy, excessively sleepy, or  mentally fatigued.  6. An ENT consultation which may be useful for specific causes of and possible treatment of bothersome snoring .  7. Weight loss may be of benefit in reducing the severity of snoring    This study was personally reviewed and electronically signed by: Wilbert Bihari, MD Accredited Board Certified in Sleep Medicine Date/Time: 02/28/2024 3:19PM

## 2024-03-01 ENCOUNTER — Telehealth: Payer: Self-pay | Admitting: *Deleted

## 2024-03-01 NOTE — Telephone Encounter (Signed)
-----   Message from Wilbert Bihari sent at 02/28/2024  3:24 PM EDT ----- Please let patient know that sleep study showed no significant sleep apnea.

## 2024-03-01 NOTE — Telephone Encounter (Signed)
 The patient has been notified of the result and verbalized understanding.  All questions (if any) were answered. Joshua Dalton Seip, CMA 03/01/2024 5:49 PM    Pt is aware of her normal result.

## 2024-03-07 ENCOUNTER — Encounter (HOSPITAL_COMMUNITY): Payer: MEDICAID | Admitting: Cardiology

## 2024-03-23 ENCOUNTER — Other Ambulatory Visit (HOSPITAL_COMMUNITY): Payer: Self-pay | Admitting: Cardiology

## 2024-03-23 MED ORDER — DIGOXIN 62.5 MCG PO TABS: 0.0625 mg | ORAL_TABLET | Freq: Every day | ORAL | 3 refills | Status: DC

## 2024-04-03 ENCOUNTER — Telehealth (HOSPITAL_COMMUNITY): Payer: Self-pay | Admitting: Adult Health

## 2024-04-03 NOTE — Telephone Encounter (Signed)
 Called to confirm/remind patient of their appointment at the [facility] on 04/03/24.   Appointment:   [x] Confirmed  [] Left mess   [] No answer/No voice mail  [] VM Full/unable to leave message  [] Phone not in service  Patient reminded to bring all medications and/or complete list.  Confirmed patient has transportation. Gave directions, instructed to utilize valet parking.

## 2024-04-03 NOTE — Telephone Encounter (Signed)
 Called to confirm/remind patient of their appointment at the [facility] on 04/03/2024.   Appointment:   [] Confirmed  [x] Left mess   [] No answer/No voice mail  [] VM Full/unable to leave message  [] Phone not in service  Patient reminded to bring all medications and/or complete list.  Confirmed patient has transportation. Gave directions, instructed to utilize valet parking.

## 2024-04-04 ENCOUNTER — Encounter (HOSPITAL_COMMUNITY): Payer: Self-pay | Admitting: Cardiology

## 2024-04-04 ENCOUNTER — Ambulatory Visit (HOSPITAL_COMMUNITY)
Admission: RE | Admit: 2024-04-04 | Discharge: 2024-04-04 | Disposition: A | Discharge status: Home / Self Care | Payer: MEDICAID | Source: Ambulatory Visit | Attending: Adult Health | Admitting: Adult Health

## 2024-04-04 VITALS — BP 142/90 | HR 73 | Ht 66.0 in | Wt 187.2 lb

## 2024-04-04 DIAGNOSIS — I110 Hypertensive heart disease with heart failure: Secondary | ICD-10-CM | POA: Diagnosis present

## 2024-04-04 DIAGNOSIS — E119 Type 2 diabetes mellitus without complications: Secondary | ICD-10-CM | POA: Diagnosis not present

## 2024-04-04 DIAGNOSIS — I5022 Chronic systolic (congestive) heart failure: Secondary | ICD-10-CM | POA: Diagnosis present

## 2024-04-04 DIAGNOSIS — Z7984 Long term (current) use of oral hypoglycemic drugs: Secondary | ICD-10-CM | POA: Insufficient documentation

## 2024-04-04 DIAGNOSIS — Z79899 Other long term (current) drug therapy: Secondary | ICD-10-CM | POA: Insufficient documentation

## 2024-04-04 DIAGNOSIS — I428 Other cardiomyopathies: Secondary | ICD-10-CM | POA: Insufficient documentation

## 2024-04-04 DIAGNOSIS — I7300 Raynaud's syndrome without gangrene: Secondary | ICD-10-CM | POA: Diagnosis not present

## 2024-04-04 DIAGNOSIS — E1169 Type 2 diabetes mellitus with other specified complication: Secondary | ICD-10-CM | POA: Diagnosis not present

## 2024-04-04 DIAGNOSIS — I10 Essential (primary) hypertension: Secondary | ICD-10-CM

## 2024-04-04 MED ORDER — HYDRALAZINE HCL 25 MG PO TABS: 25.0000 mg | ORAL_TABLET | Freq: Three times a day (TID) | ORAL | 3 refills | Status: DC

## 2024-04-04 MED ORDER — FUROSEMIDE 20 MG PO TABS: 20.0000 mg | ORAL_TABLET | Freq: Every day | ORAL | 3 refills | Status: DC

## 2024-04-04 MED ORDER — SPIRONOLACTONE 25 MG PO TABS: 25.0000 mg | ORAL_TABLET | Freq: Every day | ORAL | 3 refills | Status: DC

## 2024-04-04 NOTE — Patient Instructions (Signed)
 Medication Changes:  INCREASE HYDRALAZINE  TO 25MG  THREE TIMES DAILY   INCREASE SPIRONOLACTONE  25MG  ONCE DAILY   TAKE LASIX  (FUROSEMIDE ) 20MG  ONCE DAILY   Follow-Up in: 3 WEEKS WITH PHARMACY, AND THEN AGAIN IN 8 WEEKS WITH DR. ZENAIDA   At the [facility], you and your health needs are our priority. We have a designated team specialized in the treatment of Heart Failure. This Care Team includes your primary Heart Failure Specialized Cardiologist (physician), Advanced Practice Providers (APPs- Physician Assistants and Nurse Practitioners), and Pharmacist who all work together to provide you with the care you need, when you need it.   You may see any of the following providers on your designated Care Team at your next follow up:  Dr. Toribio Fuel Dr. Ezra Shuck Dr. Odis Zenaida Greig Mosses, NP Caffie Shed, [place] [facility] [place], [place] Beckey Coe, NP Jordan Lee, NP Tinnie Redman, PharmD   Please be sure to bring in all your medications bottles to every appointment.   Need to Contact Us :  If you have any questions or concerns before your next appointment please send us  a message through [place] or call our office at [phone].    TO LEAVE A MESSAGE FOR THE NURSE SELECT OPTION 2, PLEASE LEAVE A MESSAGE INCLUDING: YOUR NAME DATE OF BIRTH CALL BACK NUMBER REASON FOR CALL**this is important as we prioritize the call backs  YOU WILL RECEIVE A CALL BACK THE SAME DAY AS LONG AS YOU CALL BEFORE 4:00 PM

## 2024-04-04 NOTE — Progress Notes (Signed)
   ADVANCED HEART FAILURE CLINIC NOTE  Primary Care: [facility]  Adult And Pediatric HF Cardiologist: Dr. Gardenia  Chief Complaint: Heart Failure Follow Up HPI: Cheryl Curry is a 38 y.o. female with systolic heart failure d/t nonischemic cardiomyopathy, Raynaud's, hypertension, and type 2 diabetes. She was admitted in May 2025 for acute systolic heart failure and hypertensive urgency. Echo with EF of 20 to 25%. Started on Entresto , Farxiga , low-dose diuretics and discharged home. Was seen in [facility] after hospitalization.   Cheryl Curry has 3 kids at home.  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  Family hx: significant hx of HTN; no heart disease.  Today she returns for HF follow up. Complaining of fatigue after working working. Overall feeling fine. Gets lightheaded/dizzy every not and then. Denies SOB/PND/Orthopnea. Appetite ok. No fever or chills. She has been off lasix  for the last month, unable to get a refill. She has been been missing afternoon dose of hydralaziena and continued to take 12.5 mg spiro daily instead of 25 mg daily. Works full time at [employer].   PHYSICAL EXAM:  Blood pressure (!) 142/90, pulse 73, height 5' 6 (1.676 m), weight 84.9 kg (187 lb 3.2 oz), SpO2 100%.  Wt Readings from Last 3 Encounters:  04/04/24 84.9 kg (187 lb 3.2 oz)  02/21/24 85.3 kg (188 lb)  01/14/24 81.3 kg (179 lb 3.2 oz)    General:   No resp difficulty Neck: no JVD.  Cor: Regular rate & rhythm.  Lungs: clear Abdomen: soft, nontender, nondistended.  Extremities: no  edema Neuro: alert & oriented x3  DATA REVIEW  ECG: 11/15/23: ST 103 bpm (personally reviewed) 10/25/2023: Sinus tachycardia with poor R wave progression   ECHO: 10/15/2023: EF 20 to 25%, normal RV function  CMR: 12/26/23: LVEF 23%, RV 43%, scar pattern seen in NICM  ASSESSMENT & PLAN:  Chronic systolic heart failure Etiology of HF: NICM. No significant family history of heart  failure.  Suspect HTN CM.  12/2023 Cardiac MRI - LV 23% RV nomral. Basal Septal Midwall LGE scar pattern , NICM    NYHA class / AHA Stage: NYHA IIb Volume & Diuretics: ReDs reading: 36 %, abnormal. Restart lasix  20 mg daily.  GDMT: - ARB/ARNi: continue Entresto  97/103 mg bid  - ? blocker: continue toprol  XL 25 mg daily hold off on uptitration; continue digoxin  0.0625 mg daily  - MRA: Increase spirolactone to 25 mg daily  - SGLT2i: continue farxiga  10 mg daily  -Increasing hydralazine  25 mg three times a day. Consider Bidil if needed.  Devices therapies & Valvulopathies: Uptitrating GDMT, schedule Echo next visit.  If EF remains <35%. Would be ICD candidate. QRS narrow.  Advanced therapies: Not currently indicated Discussed avoiding pregnancy. She has Nexplanon .   2.  Type 2 diabetes - Followed by her PCP - A1C of 8.4 on 8/25  3.  Hypertension - Elevated. Increase hydralazine  25 mg three times a day and spiro increased to 25 mg daily.  Follow up in 3 weeks with pharmacy and 6 weeks with pharmacy.   Greig Mosses, NP 04/04/24

## 2024-04-04 NOTE — Progress Notes (Signed)
 ReDS Vest / Clip - 04/04/24 1000       ReDS Vest / Clip   Station Marker C    Ruler Value 29    ReDS Value Range Moderate volume overload    ReDS Actual Value 36

## 2024-04-19 ENCOUNTER — Other Ambulatory Visit (HOSPITAL_COMMUNITY): Payer: Self-pay | Admitting: Adult Health

## 2024-04-24 ENCOUNTER — Other Ambulatory Visit (HOSPITAL_COMMUNITY): Payer: Self-pay

## 2024-04-24 ENCOUNTER — Ambulatory Visit (HOSPITAL_COMMUNITY)
Admission: RE | Admit: 2024-04-24 | Discharge: 2024-04-24 | Disposition: A | Discharge status: Home / Self Care | Payer: MEDICAID | Source: Ambulatory Visit | Attending: Cardiology | Admitting: Cardiology

## 2024-04-24 ENCOUNTER — Telehealth (HOSPITAL_COMMUNITY): Payer: Self-pay

## 2024-04-24 VITALS — BP 154/108 | HR 74 | Wt 179.8 lb

## 2024-04-24 DIAGNOSIS — Z79899 Other long term (current) drug therapy: Secondary | ICD-10-CM | POA: Insufficient documentation

## 2024-04-24 DIAGNOSIS — I428 Other cardiomyopathies: Secondary | ICD-10-CM | POA: Insufficient documentation

## 2024-04-24 DIAGNOSIS — I5022 Chronic systolic (congestive) heart failure: Secondary | ICD-10-CM | POA: Diagnosis present

## 2024-04-24 DIAGNOSIS — I7300 Raynaud's syndrome without gangrene: Secondary | ICD-10-CM | POA: Diagnosis not present

## 2024-04-24 DIAGNOSIS — Z7984 Long term (current) use of oral hypoglycemic drugs: Secondary | ICD-10-CM | POA: Diagnosis not present

## 2024-04-24 DIAGNOSIS — I110 Hypertensive heart disease with heart failure: Secondary | ICD-10-CM | POA: Diagnosis not present

## 2024-04-24 DIAGNOSIS — E119 Type 2 diabetes mellitus without complications: Secondary | ICD-10-CM | POA: Diagnosis not present

## 2024-04-24 MED ORDER — DAPAGLIFLOZIN PROPANEDIOL 10 MG PO TABS: 10.0000 mg | ORAL_TABLET | Freq: Every day | ORAL | 3 refills | Status: AC

## 2024-04-24 MED ORDER — ISOSORB DINITRATE-HYDRALAZINE 20-37.5 MG PO TABS: 1.0000 | ORAL_TABLET | Freq: Three times a day (TID) | ORAL | 11 refills | Status: DC

## 2024-04-24 NOTE — Patient Instructions (Signed)
 It was a pleasure seeing you today!  MEDICATIONS: -We are changing your medications today -STOP hydralazine  -Start Bidil (Hydralazine  37.5 mg/Isosorbide 20 mg) 1 tablet 3 times daily.  -Increase Farxiga  to 10 mg (1 tablet) daily. Can continue taking 2x 5 mg tablets until old prescription bottle runs outs.  -Call if you have questions about your medications.  LABS: -We will call you if your labs need attention.  NEXT APPOINTMENT: Return to clinic in 1 month with Dr. Zenaida.  In general, to take care of your heart failure: -Limit your fluid intake to 2 Liters (half-gallon) per day.   -Limit your salt intake to ideally 2-3 grams (2000-3000 mg) per day. -Weigh yourself daily and record, and bring that weight diary to your next appointment.  (Weight gain of 2-3 pounds in 1 day typically means fluid weight.) -The medications for your heart are to help your heart and help you live longer.   -Please contact us  before stopping any of your heart medications.  Call the clinic at [phone] with questions or to reschedule future appointments.

## 2024-04-24 NOTE — Telephone Encounter (Signed)
 Advanced Heart Failure Patient Advocate Encounter  Test billing for this patient's current coverage [place]) returns a $4 copay for 90 day supply of Bidil (DAW 9).  This test claim was processed through [facility]- copay amounts may vary at other pharmacies due to pharmacy/plan contracts, or as the patient moves through the different stages of their insurance plan.  Rachel DEL, CPhT Rx Patient Advocate Phone: [phone]

## 2024-04-24 NOTE — Progress Notes (Signed)
 Advanced Heart Failure Clinic Note   Primary Care: [facility]  Adult And Pediatric HF Cardiologist: Dr. Zenaida  HPI:  Cheryl Curry is a 38 y.o. female with systolic heart failure d/t nonischemic cardiomyopathy, Raynaud's, hypertension, and type 2 diabetes. She was admitted in May 2025 for acute systolic heart failure and hypertensive urgency. Echo with EF of 20 to 25%. Started on Entresto , Farxiga , low-dose diuretics and discharged home. Was seen in [facility] after hospitalization.   She returned for HF follow up with NP on 04/04/24. Complained of fatigue after working working. Overall felt fine. Got lightheaded/dizzy every now and then. Denied SOB/PND/Orthopnea. Appetite was okay. Denied fever or chills. She had been off furosemide  for the past last month due to being unable to get a refill. She had been been missing afternoon dose of hydralazine  and continued to take spironolactone  12.5 daily instead of 25 mg daily. Worked full time at [employer].   Today she returns to HF clinic for pharmacist medication titration. At last visit with NP, patient was instructed to increase spironolactone  to 25 mg daily and hydralazine  to 25 mg three times daily. Patient also instructed to restart furosemide  20 mg daily. Today, patient reports adherence to all her medications, except she is only taking Farxiga  5 mg. Appears to be filling an Farxiga  old prescription. Overall, patient reports feeling okay. Reports fatigue and dizziness that has been ongoing. Patient reports it remains the same as prior appointments. Patient reports BP at home ranges between 140-160/90-100 mmHg. Blood pressure noted to be elevated to 154/108 mmHg in clinic today. Reports occasional episodes of sharp pain that last 10 seconds but self resolves, reports this occurs a few times per week. Denies palpitations. Denies SOB, but reports avoiding hills and stairs. Denies PND/orthopnea. No LEE noted. Noted weight down ~7 lbs  in clinic today.   HF Medications: Metoprolol  succinate 25 mg daily Entresto  97/103 mg BID Spironolactone  25 mg daily Farxiga  5 mg daily Furosemide  20 mg daily Digoxin  0.0625 mg daily Hydralazine  25 mg TID  Does the patient have any problems obtaining medications due to transportation or finances?   No, patient has a Pharmacologist.   Understanding of regimen: fair Understanding of indications: fair Potential of compliance: fair Patient understands to avoid NSAIDs. Patient understands to avoid decongestants.   Pertinent Lab Values: (04/19/24) Serum creatinine 0.93 mg/dL, BUN 12 mg/dL, Potassium 4.8 mmol/L, Sodium 135 mmol/L, Digoxin  <0.4 (12/14/23)   Vital Signs: Weight: 179 lbs (last clinic weight: 187 lbs) Blood pressure: 154/108 mmHg (last clinic BP: 142/90 mmHg) Heart rate: 74 bpm  Assessment/Plan: Chronic systolic heart failure Etiology of HF: NICM. No significant family history of heart failure.  Suspect HTN CM.  12/2023 Cardiac MRI - LV 23% RV normal. Basal Septal Midwall LGE scar pattern , NICM    NYHA class / AHA Stage: NYHA IIb Volume & Diuretics: ReDs reading: 36 %, abnormal. Restart lasix  20 mg daily.  GDMT:   - ? blocker: Continue metoprolol  succinate 25 mg daily; continue digoxin  0.0625 mg daily   - ARB/ARNi: Continue Entresto  97/103 mg bid   - MRA: Continue spirolactone 25 mg daily. Repeat Bmet on 04/19/24 WNL.   - SGLT2i: Increase Farxiga  to 10 mg daily. Updated prescription sent to patients home pharmacy.   - Stop hydralazine  and start Bidil 1 tablet TID. Concerned high blood pressure is contributing to above symptoms. Encouraged patient to continue checking blood pressure daily at home and contact clinic if her dizziness worsens.  Devices therapies & Valvulopathies: Uptitrating GDMT, plan for repeat Echo with future visit to be schedule by MD.  If EF remains <35%, would be ICD candidate per MD. QRS narrow. Advanced therapies: Not currently indicated She  has Nexplanon .   2.  Type 2 diabetes - Followed by her PCP - A1C of 9.8 on 04/19/2024   3.  Hypertension - Elevated to 154/108 mmHg.  - Stop hydralazine  and start BiDil 1 tablet TID.  Follow up in 1 month with Dr. Zenaida Morna Breach, PharmD PGY2 Cardiology Pharmacy Resident   Please do not hesitate to reach out with questions or concerns,  Jaun Bash, PharmD, CPP, BCPS, [facility] Heart Failure Pharmacist  Phone - [phone] 04/25/2024 10:39 AM

## 2024-04-28 ENCOUNTER — Other Ambulatory Visit (HOSPITAL_COMMUNITY): Payer: Self-pay

## 2024-04-28 ENCOUNTER — Telehealth (HOSPITAL_COMMUNITY): Payer: Self-pay | Admitting: Cardiology

## 2024-04-28 NOTE — Telephone Encounter (Signed)
 Pharmacy called

## 2024-05-01 ENCOUNTER — Telehealth (HOSPITAL_COMMUNITY): Payer: Self-pay | Admitting: Cardiology

## 2024-05-01 NOTE — Telephone Encounter (Signed)
 Patient called to report HA since starting bidil reports she has not taken tylenol  before doses only after once the HA starts  Recent readings includes 11 /23 138/80  11/23 100/60   Would like to know if something else can be prescribed for b/p,   Please advise

## 2024-05-01 NOTE — Telephone Encounter (Signed)
 Pt aware.

## 2024-05-12 ENCOUNTER — Telehealth (HOSPITAL_COMMUNITY): Payer: Self-pay | Admitting: Cardiology

## 2024-05-12 NOTE — Telephone Encounter (Signed)
 Patient left VM on triage line with concerns of hand swelling  Attempted to return call-poor connection (call choppy)   Patient reports R hand swelling-occasional tingling  Denies SOB Current weight 179-normal 178 Denies missed doses of medication Denies CP or palp Denies dizziness  Denies recent trauma or injury  Advised to also contact PCP in the event above symptoms are not cardiac related

## 2024-05-15 NOTE — Telephone Encounter (Signed)
 Agree, suggest follow-up with primary

## 2024-05-18 NOTE — Telephone Encounter (Signed)
 Pt aware.

## 2024-05-22 ENCOUNTER — Telehealth (HOSPITAL_COMMUNITY): Payer: Self-pay | Admitting: Cardiology

## 2024-05-22 NOTE — Telephone Encounter (Signed)
 Called to confirm/remind patient of their appointment at the Advanced Heart Failure Clinic on 05/22/2024.   Appointment:   [x] Confirmed  [] Left mess   [] No answer/No voice mail  [] VM Full/unable to leave message  [] Phone not in service  Patient reminded to bring all medications and/or complete list.  Confirmed patient has transportation. Gave directions, instructed to utilize valet parking.

## 2024-05-23 ENCOUNTER — Ambulatory Visit (HOSPITAL_COMMUNITY)
Admission: RE | Admit: 2024-05-23 | Discharge: 2024-05-23 | Disposition: A | Discharge status: Home / Self Care | Payer: MEDICAID | Source: Ambulatory Visit | Attending: Cardiology | Admitting: Cardiology

## 2024-05-23 VITALS — BP 140/82 | HR 80 | Wt 183.0 lb

## 2024-05-23 DIAGNOSIS — I10 Essential (primary) hypertension: Secondary | ICD-10-CM | POA: Diagnosis not present

## 2024-05-23 DIAGNOSIS — E119 Type 2 diabetes mellitus without complications: Secondary | ICD-10-CM | POA: Diagnosis not present

## 2024-05-23 DIAGNOSIS — Z7984 Long term (current) use of oral hypoglycemic drugs: Secondary | ICD-10-CM | POA: Diagnosis not present

## 2024-05-23 DIAGNOSIS — I5022 Chronic systolic (congestive) heart failure: Secondary | ICD-10-CM | POA: Insufficient documentation

## 2024-05-23 DIAGNOSIS — Z8249 Family history of ischemic heart disease and other diseases of the circulatory system: Secondary | ICD-10-CM | POA: Diagnosis not present

## 2024-05-23 DIAGNOSIS — I110 Hypertensive heart disease with heart failure: Secondary | ICD-10-CM | POA: Insufficient documentation

## 2024-05-23 DIAGNOSIS — Z79899 Other long term (current) drug therapy: Secondary | ICD-10-CM | POA: Insufficient documentation

## 2024-05-23 DIAGNOSIS — I428 Other cardiomyopathies: Secondary | ICD-10-CM | POA: Diagnosis not present

## 2024-05-23 DIAGNOSIS — R42 Dizziness and giddiness: Secondary | ICD-10-CM | POA: Diagnosis not present

## 2024-05-23 DIAGNOSIS — E1169 Type 2 diabetes mellitus with other specified complication: Secondary | ICD-10-CM

## 2024-05-23 MED ORDER — CARVEDILOL 6.25 MG PO TABS: 6.2500 mg | ORAL_TABLET | Freq: Two times a day (BID) | ORAL | 3 refills | Status: DC

## 2024-05-23 MED ORDER — FUROSEMIDE 20 MG PO TABS: 20.0000 mg | ORAL_TABLET | ORAL | 3 refills | Status: DC | PRN

## 2024-05-23 NOTE — Patient Instructions (Signed)
 STOP Digoxin   STOP Metoprolol    START Carvedilol  6.25 mg Twice daily  CHANGE Lasix  to as needed for weight gain of 3 lb in 24 hours or 5 lb in a week.  Labs done today, your results will be available in MyChart, we will contact you for abnormal readings.  Please follow up with our heart failure pharmacist in 1 month.  Your physician recommends that you schedule a follow-up appointment in: 4 months ( April 2026) ** PLEASE CALL THE OFFICE IN FEBRUARY TO ARRANGE YOUR FOLLOW UP APPOINTMENT.**  If you have any questions or concerns before your next appointment please send us  a message through [place] or call our office at [phone].    TO LEAVE A MESSAGE FOR THE NURSE SELECT OPTION 2, PLEASE LEAVE A MESSAGE INCLUDING: YOUR NAME DATE OF BIRTH CALL BACK NUMBER REASON FOR CALL**this is important as we prioritize the call backs  YOU WILL RECEIVE A CALL BACK THE SAME DAY AS LONG AS YOU CALL BEFORE 4:00 PM  At the [facility], you and your health needs are our priority. As part of our continuing mission to provide you with exceptional heart care, we have created designated Provider Care Teams. These Care Teams include your primary Cardiologist (physician) and Advanced Practice Providers (APPs- Physician Assistants and Nurse Practitioners) who all work together to provide you with the care you need, when you need it.   You may see any of the following providers on your designated Care Team at your next follow up: Dr Toribio Fuel Dr Ezra Shuck Dr. Morene Brownie Greig Mosses, NP Caffie Shed, [place] [facility] [place], [place] Beckey Coe, NP Jordan Lee, NP Ellouise Class, NP Tinnie Redman, PharmD Jaun Bash, PharmD   Please be sure to bring in all your medications bottles to every appointment.    Thank you for choosing [facility]

## 2024-05-23 NOTE — Progress Notes (Signed)
° °  ADVANCED HEART FAILURE FOLLOW UP CLINIC NOTE  Referring Physician: [facility]  Adult And Pediatric  Primary Care: [facility]  Adult And Pediatric Primary Cardiologist:  HPI: Cheryl Curry is a 38 y.o. female who presents for follow up of ***.      {Anything typed between these two boxes will persist and can be pulled forward to future notes. This phrase will delete itself when the note is signed :1}   @HCHISTORYNARRATIVE @     SUBJECTIVE:  PMH, current medications, allergies, social history, and family history reviewed in epic.  PHYSICAL EXAM: There were no vitals filed for this visit. GENERAL: Well nourished and in no apparent distress at rest.  PULM:  Normal work of breathing, clear to auscultation bilaterally. Respirations are unlabored.  CARDIAC:  JVP: ***         Normal rate with regular rhythm. No murmurs, rubs or gallops.  *** edema. Warm and well perfused extremities. ABDOMEN: Soft, non-tender, non-distended. NEUROLOGIC: Patient is oriented x3 with no focal or lateralizing neurologic deficits.    DATA REVIEW  ECG: ***    ECHO: ***   CATH: ***   Heart failure review: - Classification: {HFCLASS:30917} - Etiology: {Cardiomyopathy:30918} - NYHA Class:  - Volume status: {volumechf:30919} - ACEi/ARB/ARNI: {HF:30752} - Aldosterone antagonist: {HF:30752} - Beta-blocker: {HF:30752} - Digoxin : {HF:30752} - Hydralazine /Nitrates: {HF:30752} - SGLT2i: {HF:30752} - GLP-1: {GLP:30906} - Advanced therapies: {Advancedtherapies:30916} - ICD: {ICD:30901}  ASSESSMENT & PLAN:  ***  Follow up in ***  Morene Brownie, MD Advanced Heart Failure Mechanical Circulatory Support 05/23/2024

## 2024-06-07 ENCOUNTER — Telehealth (HOSPITAL_COMMUNITY): Payer: Self-pay | Admitting: Cardiology

## 2024-06-07 ENCOUNTER — Other Ambulatory Visit (HOSPITAL_COMMUNITY): Payer: Self-pay

## 2024-06-07 DIAGNOSIS — I5022 Chronic systolic (congestive) heart failure: Secondary | ICD-10-CM

## 2024-06-07 MED ORDER — CARVEDILOL 6.25 MG PO TABS
6.2500 mg | ORAL_TABLET | Freq: Two times a day (BID) | ORAL | 3 refills | Status: AC
  Filled 2024-06-07: qty 60, 30d supply, fill #0
  Filled 2024-07-04: qty 60, 30d supply, fill #1

## 2024-06-07 MED ORDER — HYDRALAZINE HCL 25 MG PO TABS
25.0000 mg | ORAL_TABLET | Freq: Three times a day (TID) | ORAL | 3 refills | Status: AC
  Filled 2024-06-07: qty 90, 30d supply, fill #0
  Filled 2024-07-04: qty 90, 30d supply, fill #1

## 2024-06-07 MED ORDER — SPIRONOLACTONE 25 MG PO TABS
25.0000 mg | ORAL_TABLET | Freq: Every day | ORAL | 3 refills | Status: AC
  Filled 2024-06-07: qty 30, 30d supply, fill #0
  Filled 2024-07-04: qty 30, 30d supply, fill #1

## 2024-06-07 MED ORDER — FUROSEMIDE 20 MG PO TABS
20.0000 mg | ORAL_TABLET | ORAL | 3 refills | Status: AC | PRN
  Filled 2024-06-07: qty 30, 30d supply, fill #0

## 2024-06-07 MED ORDER — ISOSORBIDE MONONITRATE ER 30 MG PO TB24
30.0000 mg | ORAL_TABLET | Freq: Every day | ORAL | 3 refills | Status: AC
  Filled 2024-06-07: qty 30, 30d supply, fill #0
  Filled 2024-07-04: qty 30, 30d supply, fill #1

## 2024-06-07 MED ORDER — POTASSIUM CHLORIDE CRYS ER 20 MEQ PO TBCR
20.0000 meq | EXTENDED_RELEASE_TABLET | Freq: Every day | ORAL | 3 refills | Status: AC
  Filled 2024-06-07: qty 30, 30d supply, fill #0
  Filled 2024-07-04: qty 30, 30d supply, fill #1

## 2024-06-07 NOTE — Telephone Encounter (Signed)
"  Meds sent  "

## 2024-06-07 NOTE — Telephone Encounter (Signed)
 Patient called to report her insurance is no longer active. Reports she is working through re-applying for longs drug stores. In the meantime will need assistance with medications  -calling about bidil  Will confirm dose of separate medications  Will also send all cardiac meds with HF fund  Message to LCSW as FYI, in case assistance is needed

## 2024-06-12 ENCOUNTER — Telehealth (HOSPITAL_COMMUNITY): Payer: Self-pay | Admitting: Licensed Clinical Social Worker

## 2024-06-12 NOTE — Telephone Encounter (Signed)
 H&V Care Navigation CSW Progress Note  Clinical Social Worker consulted to speak with pt regarding Medicaid stopping.  Pt reports medicaid stopped 11/31 when she was told she was now over income for Medicaid.  Has not pursued other insurance at this time. CSW discussed applying for [employer] for cone bills, [employer] for community MD visits, and [place] [employer] for non heart failure fund medications- sent applications for these programs.  Pt getting meds through HF fund but can't get several of the meds- CSW sent to patient advocate to assist as able.  Pt reports no other needs at this time.  SDOH Screenings   Food Insecurity: Not at Risk (04/18/2024)   Received from [facility]  Housing: Low Risk (10/14/2023)  Transportation Needs: Not at Risk (04/18/2024)   Received from [facility]  Utilities: Not At Risk (10/14/2023)  Depression (PHQ2-9): Low Risk (07/09/2021)  Financial Resource Strain: Medium Risk (06/12/2024)  Physical Activity: Not on File (09/25/2021)   Received from [facility]  Social Connections: Not on File (02/15/2023)   Received from [facility]  Stress: Not on File (09/25/2021)   Received from [facility]  Tobacco Use: Low Risk (04/04/2024)   Cheryl HILARIO Leech, LCSW Clinical Social Worker Advanced Heart Failure Clinic Desk#: [phone] Cell#: [phone]

## 2024-06-14 ENCOUNTER — Telehealth (HOSPITAL_COMMUNITY): Payer: Self-pay

## 2024-06-14 NOTE — Telephone Encounter (Signed)
 Advanced Heart Failure Patient Advocate Encounter  Application for Farxiga  [place]ed to [place] on 06/14/2024. Application form attached to patient chart.  Rachel DEL, CPhT Rx Patient Advocate Phone: [phone]

## 2024-06-16 NOTE — Progress Notes (Incomplete)
 ***In Progress***   Advanced Heart Failure Clinic Note   Primary Care: [facility]  Adult And Pediatric HF Cardiologist: Dr. Zenaida  HPI:  Cheryl Curry is a 39 y.o. female with systolic heart failure d/t nonischemic cardiomyopathy, Raynaud's, hypertension, and type 2 diabetes. She was admitted in May 2025 for acute systolic heart failure and hypertensive urgency. Echo with EF of 20 to 25%. Started on Entresto , Farxiga , low-dose diuretics and discharged home. Was seen in [facility] after hospitalization.   She returned for HF follow up with Dr. Zenaida on 05/23/24. Patient reported overall feeling fair, her BP was significantly elevated despite increases in medication at previous visit. Reported some occasional orthostatic symptoms and dizziness with moving around. BP during that time was fine. Had been taking her medications as prescribed.   Presented to ED 06/24/24 2/2 vaginal itching without discharge. Had failed OTC Monistat . She was given oral fluconazole .  Today she returns to HF clinic for pharmacist medication titration. At last visit with MD, metoprolol  25 mg daily was changed to carvedilol  6.25 mg BID and digoxin  was discontinued. Lasix  was changed to PRN only. Since that visit, Medicaid informed her that she is now over the income limit and terminated her coverage. She is uninsured. Farxiga  PAP was initiated but has been denied and is in appeal process. She was approved for Jardiance PAP through [facility] as an alternative. However, in January she was reinstated with Medicaid and has access to this again.    Today he returns to HF clinic for pharmacist medication titration. At last visit with MD ***.   Overall feeling ***. Dizziness, lightheadedness, fatigue:  Chest pain or palpitations:  How is your breathing?: *** SOB: Able to complete all ADLs. Activity level ***  Weight at home pounds. Takes furosemide /torsemide/bumex *** mg *** daily.   LEE PND/Orthopnea  Appetite *** Low-salt diet:   Physical Exam Cost/affordability of meds  -no insurance, HF fund -stop Farxiga  and start Jardiance ([employer] Cares approved, Farxiga  denied) - wait for current yeast infxn treatment to resolve >>r/s entresto  since likely out vs inc coreg  -reepat bmet? Ok 04/18/24 nothing since -repeat pharm visit? On recall for Zenaida in April  HF Medications: Carvedilol  6.25 mg BID Entresto  97/103 mg BID Spironolactone  25 mg daily Farxiga  10 mg daily Furosemide  20 mg PRN Hydralazine  25 mg TID Imdur  30 mg daily  Does the patient have any problems obtaining medications due to transportation or finances?   No insurance, now over the income limit for Medicaid. Using HF fund for generic HF prescriptions (except Entresto ). Now approved for [employer] for Jardiance PAP (was denied Farxiga  PAP).  Understanding of regimen: fair Understanding of indications: fair Potential of compliance: fair Patient understands to avoid NSAIDs. Patient understands to avoid decongestants.   Pertinent Lab Values: (04/19/24) *** Serum creatinine 0.93 mg/dL, BUN 12 mg/dL, Potassium 4.8 mmol/L, Sodium 135 mmol/L, Digoxin  <0.4 (12/14/23)   Vital Signs: Weight: 179 lbs (last clinic weight: 187 lbs) Blood pressure: 154/108 mmHg (last clinic BP: 142/90 mmHg) Heart rate: 74 bpm  DATA REVIEW   ECG: 11/15/23: ST 103 bpm  10/25/2023: Sinus tachycardia with poor R wave progression      ECHO: 10/15/2023: LVEF 20-25%, normal RV    CATH: None    CMR: LVEF 23%, RV 43%, NICM scar pattern with basal septum involvement  Assessment/Plan:Chronic systolic heart failure: NICM based on CMR, no significant family history, suspect hypertension related. BP still elevated - NYHA class II, euvolemic - Discussed cut back in  lasix  given orthostasis - Continue entresto  97/103mg  BID - Continue spironolactone  25mg  daily - Transition metoprolol  to coreg  6.25mg  BID - Stop digoxin  - Continue farxiga   10mg  daily - Continue hydralazine  25mg  TID, may need to transition to bidil at next visit - Repeat echo once GDMT maximized' - Nexplanon  for contraception - HF pharmacy med titration in 1 week   T2DM: - Continue farxiga  10mg  daily - Last A1c 04/2024 9.8 ***not on statin   Hypertension: Still elevated in clinic today. - Adjustments as above  Tinnie Redman, PharmD, BCPS, BCCP, CPP Heart Failure Clinic Pharmacist [phone]

## 2024-06-19 ENCOUNTER — Telehealth (HOSPITAL_COMMUNITY): Payer: Self-pay

## 2024-06-19 NOTE — Telephone Encounter (Signed)
 Patient initially denied for Farxiga  assistance due to income within range for Medicaid. Patient has recently lost medicaid due to increase in income. Discussed with office staff, sending Jardiance application in separate encounter.

## 2024-06-19 NOTE — Telephone Encounter (Signed)
 Advanced Heart Failure Patient Advocate Encounter  Patient was electronically approved to receive Jardiance  from [employer] Effective 06/19/2024 to 06/19/2025  Rachel DEL, CPhT Rx Patient Advocate Phone: [phone]

## 2024-06-23 ENCOUNTER — Ambulatory Visit (HOSPITAL_COMMUNITY): Payer: Self-pay

## 2024-06-24 ENCOUNTER — Ambulatory Visit (HOSPITAL_COMMUNITY)
Admission: RE | Admit: 2024-06-24 | Discharge: 2024-06-24 | Disposition: A | Discharge status: Home / Self Care | Payer: MEDICAID | Source: Ambulatory Visit | Attending: Emergency Medicine

## 2024-06-24 ENCOUNTER — Encounter (HOSPITAL_COMMUNITY): Payer: Self-pay

## 2024-06-24 VITALS — BP 149/82 | HR 94 | Temp 98.6°F | Resp 16

## 2024-06-24 DIAGNOSIS — L292 Pruritus vulvae: Secondary | ICD-10-CM | POA: Insufficient documentation

## 2024-06-24 DIAGNOSIS — N898 Other specified noninflammatory disorders of vagina: Secondary | ICD-10-CM

## 2024-06-24 LAB — POCT URINE PREGNANCY: Preg Test, Ur: NEGATIVE

## 2024-06-24 MED ORDER — FLUCONAZOLE 150 MG PO TABS: ORAL_TABLET | ORAL | 0 refills | Status: AC

## 2024-06-24 NOTE — Discharge Instructions (Signed)
 Today you have been screened for vaginal infections.  Take the Diflucan  today and another in 72 hours if the vaginal itching continues.  Swab results will be available over the next few days and staff will contact you if we need to change her treatment plan.  Return to clinic for any new or urgent symptoms.

## 2024-06-24 NOTE — ED Triage Notes (Signed)
 Pt states she has vaginal itching X 2 days. She used monistat  OTC and sx have started to get a little better

## 2024-06-24 NOTE — ED Provider Notes (Signed)
 " [facility]    CSN: [id number] Arrival date & time: 06/24/24  1104      History   Chief Complaint Chief Complaint  Patient presents with   Vaginal Itching    Started yesterday - Entered by patient    HPI Cheryl Curry is a 39 y.o. female.   Patient presents to clinic over concern of vaginal itching has been present for the past 2 days.  Has used over-the-counter Monistat  and itching improved some.  Has not had dysuria, urgency or frequency.  Denies abnormal vaginal discharge or vaginal odor.  She has not concern for sexually transmitted infections.  Does not want HIV or syphilis screening.  Has not had abdominal pain or fever.  Nexplanon  placed May 2023.  Irregular cycles.  The history is provided by the patient and medical records.  Vaginal Itching    Past Medical History:  Diagnosis Date   Diabetes mellitus without complication (HCC)    Hypertension    Pregnancy induced hypertension     Patient Active Problem List   Diagnosis Date Noted   Acute systolic HF (heart failure) (HCC) 10/15/2023   Acute CHF (congestive heart failure) (HCC) 10/14/2023   Essential hypertension 08/09/2019   Type 2 diabetes mellitus (HCC) 08/09/2019   Encounter for insertion subdermal contraceptive 05/27/2018    Past Surgical History:  Procedure Laterality Date   NO PAST SURGERIES      OB History     Gravida  3   Para  3   Term  2   Preterm  1   AB  0   Living  3      SAB      IAB      Ectopic      Multiple  0   Live Births  3            Home Medications    Prior to Admission medications  Medication Sig Start Date End Date Taking? Authorizing Provider  carvedilol  (COREG ) 6.25 MG tablet Take 1 tablet (6.25 mg total) by mouth 2 (two) times daily. 06/07/24  Yes Milford, Harlene HERO, FNP  dapagliflozin  propanediol (FARXIGA ) 10 MG TABS tablet Take 1 tablet (10 mg total) by mouth daily before breakfast. 04/24/24  Yes Zenaida Morene PARAS, MD   fluconazole  (DIFLUCAN ) 150 MG tablet Take 1 tablet today and another in 72 hours if vaginal itching persist. 06/24/24  Yes Ball, Robi Mitter  G, FNP  furosemide  (LASIX ) 20 MG tablet Take 1 tablet (20 mg total) by mouth as needed. For weight gain of 3 lb in 24 hours or 5 lb in a week. 06/07/24  Yes Milford, Harlene HERO, FNP  glipiZIDE (GLUCOTROL) 5 MG tablet Take 10 mg by mouth 2 (two) times daily before a meal.   Yes [provider]  hydrALAZINE  (APRESOLINE ) 25 MG tablet Take 1 tablet (25 mg total) by mouth 3 (three) times daily. 06/07/24 09/05/24 Yes Milford, Harlene HERO, FNP  isosorbide  mononitrate (IMDUR ) 30 MG 24 hr tablet Take 1 tablet (30 mg total) by mouth daily. 06/07/24 09/05/24 Yes Milford, Harlene HERO, FNP  LANTUS  SOLOSTAR 100 UNIT/ML Solostar Pen Inject 30 Units into the skin at bedtime.   Yes [provider]  potassium chloride  SA (KLOR-CON  M) 20 MEQ tablet Take 1 tablet (20 mEq total) by mouth daily. 06/07/24  Yes Milford, Harlene HERO, FNP  sacubitril -valsartan  (ENTRESTO ) 97-103 MG Take 1 tablet by mouth 2 (two) times daily. 12/14/23  Yes Sabharwal, Aditya, DO  spironolactone  (ALDACTONE )  25 MG tablet Take 1 tablet (25 mg total) by mouth daily. 06/07/24  Yes Milford, Harlene HERO, FNP  Vitamin D, Ergocalciferol, (DRISDOL) 1.25 MG (50000 UNIT) CAPS capsule Take 50,000 Units by mouth once a week.   Yes [provider]  Acetaminophen  500 MG capsule Take 500-1,000 mg by mouth every 6 (six) hours as needed for pain (or headaches- Extra Strength TYLENOL  Rapid Release Gels).    [provider]  [Paused] HUMULIN  70/30 KWIKPEN (70-30) 100 UNIT/ML KwikPen Inject 25 Units into the skin 2 (two) times daily. Patient not taking: No sig reported Wait to take this until your doctor or other care provider tells you to start again.    [provider]  OZEMPIC, 1 MG/DOSE, 2 MG/1.5ML SOPN Inject 1 mg into the skin every Monday. Patient not taking: No sig reported    [provider]  medroxyPROGESTERone  (PROVERA ) 10 MG tablet Take 1 tablet (10 mg total) by mouth daily. 08/24/18 03/01/19  Eveline Lynwood MATSU, MD  NIFEdipine  (PROCARDIA -XL/NIFEDICAL-XL) 30 MG 24 hr tablet Take 1 tablet (30 mg total) by mouth 2 (two) times daily. 05/27/18 03/01/19  Synthia Harlene, CNM    Family History Family History  Problem Relation Age of Onset   Hypertension Mother    Hypertension Father    Stroke Father    Hypertension Brother     Social History Social History[1]   Allergies   Patient has no known allergies.   Review of Systems Review of Systems  Per HPI  Physical Exam Triage Vital Signs ED Triage Vitals  Encounter Vitals Group     BP 06/24/24 1126 (!) 149/82     Girls Systolic BP Percentile --      Girls Diastolic BP Percentile --      Boys Systolic BP Percentile --      Boys Diastolic BP Percentile --      Pulse Rate 06/24/24 1126 94     Resp 06/24/24 1126 16     Temp 06/24/24 1126 98.6 F (37 C)     Temp Source 06/24/24 1126 Oral     SpO2 06/24/24 1126 97 %     Weight --      Height --      Head Circumference --      Peak Flow --      Pain Score 06/24/24 1124 0     Pain Loc --      Pain Education --      Exclude from Growth Chart --    No data found.  Updated Vital Signs BP (!) 149/82   Pulse 94   Temp 98.6 F (37 C) (Oral)   Resp 16   LMP  (LMP Unknown) Comment: very irregular with implant  SpO2 97%   Visual Acuity Right Eye Distance:   Left Eye Distance:   Bilateral Distance:    Right Eye Near:   Left Eye Near:    Bilateral Near:     Physical Exam Vitals and nursing note reviewed.  Constitutional:      Appearance: Normal appearance.  HENT:     Head: Normocephalic and atraumatic.     Right Ear: External ear normal.     Left Ear: External ear normal.     Nose: Nose normal.     Mouth/Throat:     Mouth: Mucous membranes are moist.  Eyes:     Conjunctiva/sclera: Conjunctivae normal.  Cardiovascular:     Rate and Rhythm:  Normal rate.  Pulmonary:  Effort: Pulmonary effort is normal. No respiratory distress.  Neurological:     General: No focal deficit present.     Mental Status: She is alert.  Psychiatric:        Mood and Affect: Mood normal.      UC Treatments / Results  Labs (all labs ordered are listed, but only abnormal results are displayed) Labs Reviewed  POCT URINE PREGNANCY - Normal  CERVICOVAGINAL ANCILLARY ONLY    EKG   Radiology No results found.  Procedures Procedures (including critical care time)  Medications Ordered in UC Medications - No data to display  Initial Impression / Assessment and Plan / UC Course  I have reviewed the triage vital signs and the nursing notes.  Pertinent labs & imaging results that were available during my care of the patient were reviewed by me and considered in my medical decision making (see chart for details).  Vitals and triage reviewed, patient is hemodynamically stable.  Vaginal itching without discharge or odor.  Will treat empirically with Diflucan .  Cytology swab obtained.  Urine pregnancy negative.  HIV and syphilis screening declined.  Staff will contact if treatment modification is needed.  Plan of care, follow-up care, and return precautions given, no questions at this time.    Final Clinical Impressions(s) / UC Diagnoses   Final diagnoses:  Vaginal itching     Discharge Instructions      Today you have been screened for vaginal infections.  Take the Diflucan  today and another in 72 hours if the vaginal itching continues.  Swab results will be available over the next few days and staff will contact you if we need to change her treatment plan.  Return to clinic for any new or urgent symptoms.     ED Prescriptions     Medication Sig Dispense Auth. Provider   fluconazole  (DIFLUCAN ) 150 MG tablet Take 1 tablet today and another in 72 hours if vaginal itching persist. 2 tablet Ball, Kyzer Blowe  G, FNP      PDMP not  reviewed this encounter.    [1]  Social History Tobacco Use   Smoking status: Never   Smokeless tobacco: Never  Vaping Use   Vaping status: Never Used  Substance Use Topics   Alcohol use: Yes    Comment: occassional   Drug use: No     Mercer, Caidynce Muzyka  G, FNP 06/24/24 1155  "

## 2024-06-26 ENCOUNTER — Ambulatory Visit (HOSPITAL_COMMUNITY): Payer: Self-pay

## 2024-06-26 LAB — CERVICOVAGINAL ANCILLARY ONLY
Bacterial Vaginitis (gardnerella): NEGATIVE
Candida Glabrata: POSITIVE — AB
Candida Vaginitis: POSITIVE — AB
Chlamydia: NEGATIVE
Comment: NEGATIVE
Comment: NEGATIVE
Comment: NEGATIVE
Comment: NEGATIVE
Comment: NEGATIVE
Comment: NORMAL
Neisseria Gonorrhea: NEGATIVE
Trichomonas: NEGATIVE

## 2024-06-29 ENCOUNTER — Telehealth (HOSPITAL_COMMUNITY): Payer: Self-pay

## 2024-06-29 ENCOUNTER — Ambulatory Visit (HOSPITAL_COMMUNITY): Payer: Self-pay

## 2024-06-29 ENCOUNTER — Other Ambulatory Visit (HOSPITAL_COMMUNITY): Payer: Self-pay

## 2024-06-29 NOTE — Telephone Encounter (Signed)
 Advanced Heart Failure Patient Advocate Encounter  Patients Medicaid is now showing active coverage.  Test billing for this patient's current coverage ([employer]) returns a $4 copay for 90 day supply of Entresto  (DAW 9).  This test claim was processed through [facility]- copay amounts may vary at other pharmacies due to pharmacy/plan contracts, or as the patient moves through the different stages of their insurance plan.  Rachel DEL, CPhT Rx Patient Advocate Phone: [phone]

## 2024-07-13 ENCOUNTER — Encounter (HOSPITAL_COMMUNITY): Payer: Self-pay

## 2024-07-26 ENCOUNTER — Ambulatory Visit: Payer: MEDICAID | Admitting: Podiatry

## 2024-09-27 ENCOUNTER — Ambulatory Visit (HOSPITAL_COMMUNITY): Payer: MEDICAID | Admitting: Cardiology
